# Patient Record
Sex: Female | Born: 1975 | Race: Black or African American | Hispanic: No | Marital: Married | Smoking: Never smoker
Health system: Southern US, Community
[De-identification: ages and names within clinical notes are randomized; demographics above are authoritative.]

## PROBLEM LIST (undated history)

## (undated) DIAGNOSIS — Z8371 Family history of colonic polyps: Secondary | ICD-10-CM

## (undated) DIAGNOSIS — T7840XA Allergy, unspecified, initial encounter: Secondary | ICD-10-CM

## (undated) DIAGNOSIS — Z9889 Other specified postprocedural states: Secondary | ICD-10-CM

## (undated) DIAGNOSIS — M1990 Unspecified osteoarthritis, unspecified site: Secondary | ICD-10-CM

## (undated) DIAGNOSIS — Z83719 Family history of colon polyps, unspecified: Secondary | ICD-10-CM

## (undated) DIAGNOSIS — K219 Gastro-esophageal reflux disease without esophagitis: Secondary | ICD-10-CM

## (undated) DIAGNOSIS — D649 Anemia, unspecified: Secondary | ICD-10-CM

## (undated) DIAGNOSIS — R112 Nausea with vomiting, unspecified: Secondary | ICD-10-CM

## (undated) HISTORY — DX: Family history of colon polyps, unspecified: Z83.719

## (undated) HISTORY — DX: Allergy, unspecified, initial encounter: T78.40XA

## (undated) HISTORY — DX: Family history of colonic polyps: Z83.71

## (undated) HISTORY — DX: Gastro-esophageal reflux disease without esophagitis: K21.9

## (undated) HISTORY — DX: Anemia, unspecified: D64.9

## (undated) HISTORY — PX: CARDIAC CATHETERIZATION: SHX172

---

## 2000-08-29 ENCOUNTER — Ambulatory Visit (HOSPITAL_COMMUNITY): Admission: RE | Admit: 2000-08-29 | Discharge: 2000-08-29 | Payer: Self-pay | Admitting: Family Medicine

## 2001-06-23 ENCOUNTER — Ambulatory Visit (HOSPITAL_COMMUNITY): Admission: RE | Admit: 2001-06-23 | Discharge: 2001-06-23 | Payer: Self-pay | Admitting: Family Medicine

## 2001-06-23 ENCOUNTER — Encounter: Payer: Self-pay | Admitting: Family Medicine

## 2002-08-06 ENCOUNTER — Encounter (HOSPITAL_COMMUNITY): Admission: RE | Admit: 2002-08-06 | Discharge: 2002-09-05 | Payer: Self-pay | Admitting: *Deleted

## 2002-08-07 ENCOUNTER — Encounter: Payer: Self-pay | Admitting: *Deleted

## 2003-06-15 ENCOUNTER — Ambulatory Visit (HOSPITAL_COMMUNITY): Admission: AD | Admit: 2003-06-15 | Discharge: 2003-06-15 | Payer: Self-pay | Admitting: Obstetrics and Gynecology

## 2003-06-18 ENCOUNTER — Ambulatory Visit (HOSPITAL_COMMUNITY): Admission: AD | Admit: 2003-06-18 | Discharge: 2003-06-18 | Payer: Self-pay | Admitting: Obstetrics and Gynecology

## 2003-06-25 ENCOUNTER — Ambulatory Visit (HOSPITAL_COMMUNITY): Admission: AD | Admit: 2003-06-25 | Discharge: 2003-06-25 | Payer: Self-pay | Admitting: Obstetrics and Gynecology

## 2003-06-28 ENCOUNTER — Inpatient Hospital Stay (HOSPITAL_COMMUNITY): Admission: RE | Admit: 2003-06-28 | Discharge: 2003-06-30 | Payer: Self-pay | Admitting: Obstetrics and Gynecology

## 2004-04-05 ENCOUNTER — Ambulatory Visit: Payer: Self-pay | Admitting: Family Medicine

## 2004-05-08 ENCOUNTER — Emergency Department (HOSPITAL_COMMUNITY): Admission: EM | Admit: 2004-05-08 | Discharge: 2004-05-08 | Payer: Self-pay | Admitting: Emergency Medicine

## 2004-05-23 ENCOUNTER — Ambulatory Visit: Payer: Self-pay | Admitting: Family Medicine

## 2004-07-06 ENCOUNTER — Ambulatory Visit: Payer: Self-pay | Admitting: Family Medicine

## 2004-10-12 ENCOUNTER — Ambulatory Visit: Payer: Self-pay | Admitting: Family Medicine

## 2005-01-30 ENCOUNTER — Ambulatory Visit: Payer: Self-pay | Admitting: Family Medicine

## 2005-01-30 ENCOUNTER — Ambulatory Visit (HOSPITAL_COMMUNITY): Admission: RE | Admit: 2005-01-30 | Discharge: 2005-01-30 | Payer: Self-pay | Admitting: Family Medicine

## 2005-05-02 ENCOUNTER — Ambulatory Visit: Payer: Self-pay | Admitting: Family Medicine

## 2005-08-01 ENCOUNTER — Ambulatory Visit: Payer: Self-pay | Admitting: Family Medicine

## 2005-09-13 ENCOUNTER — Ambulatory Visit: Payer: Self-pay | Admitting: Family Medicine

## 2005-11-22 ENCOUNTER — Ambulatory Visit: Payer: Self-pay | Admitting: Family Medicine

## 2005-12-20 ENCOUNTER — Ambulatory Visit: Payer: Self-pay | Admitting: Family Medicine

## 2005-12-21 ENCOUNTER — Ambulatory Visit: Payer: Self-pay | Admitting: Family Medicine

## 2005-12-25 ENCOUNTER — Ambulatory Visit: Payer: Self-pay | Admitting: Family Medicine

## 2006-01-16 ENCOUNTER — Ambulatory Visit: Payer: Self-pay | Admitting: Family Medicine

## 2006-02-22 ENCOUNTER — Ambulatory Visit: Payer: Self-pay | Admitting: Family Medicine

## 2006-06-14 ENCOUNTER — Ambulatory Visit (HOSPITAL_COMMUNITY): Admission: RE | Admit: 2006-06-14 | Discharge: 2006-06-14 | Payer: Self-pay | Admitting: Podiatry

## 2006-06-26 ENCOUNTER — Ambulatory Visit: Payer: Self-pay | Admitting: Family Medicine

## 2006-06-26 LAB — CONVERTED CEMR LAB
ALT: 11 units/L (ref 0–35)
AST: 8 units/L (ref 0–37)
Albumin: 3.9 g/dL (ref 3.5–5.2)
Alkaline Phosphatase: 72 units/L (ref 39–117)
BUN: 17 mg/dL (ref 6–23)
Bilirubin, Direct: 0.1 mg/dL (ref 0.0–0.3)
CO2: 23 meq/L (ref 19–32)
Calcium: 8.5 mg/dL (ref 8.4–10.5)
Chloride: 103 meq/L (ref 96–112)
Cholesterol: 167 mg/dL (ref 0–200)
Creatinine, Ser: 0.6 mg/dL (ref 0.40–1.20)
Glucose, Bld: 235 mg/dL — ABNORMAL HIGH (ref 70–99)
HDL: 50 mg/dL (ref >39)
Hgb A1c MFr Bld: 8.8 % — ABNORMAL HIGH (ref 4.6–6.1)
Indirect Bilirubin: 0.2 mg/dL (ref 0.0–0.9)
LDL Cholesterol: 106 mg/dL — ABNORMAL HIGH (ref 0–99)
Potassium: 4.5 meq/L (ref 3.5–5.3)
Sodium: 138 meq/L (ref 135–145)
Total Bilirubin: 0.3 mg/dL (ref 0.3–1.2)
Total CHOL/HDL Ratio: 3.3
Total Protein: 6.8 g/dL (ref 6.0–8.3)
Triglycerides: 57 mg/dL (ref <150)
VLDL: 11 mg/dL (ref 0–40)

## 2006-08-14 ENCOUNTER — Ambulatory Visit: Payer: Self-pay | Admitting: Family Medicine

## 2006-09-27 ENCOUNTER — Encounter: Payer: Self-pay | Admitting: Family Medicine

## 2006-09-30 ENCOUNTER — Ambulatory Visit: Payer: Self-pay | Admitting: Family Medicine

## 2006-12-30 ENCOUNTER — Encounter: Payer: Self-pay | Admitting: Family Medicine

## 2006-12-30 LAB — CONVERTED CEMR LAB: Hgb A1c MFr Bld: 6.6 % — ABNORMAL HIGH (ref 4.6–6.1)

## 2006-12-31 ENCOUNTER — Ambulatory Visit: Payer: Self-pay | Admitting: Family Medicine

## 2007-04-10 ENCOUNTER — Ambulatory Visit: Payer: Self-pay | Admitting: Family Medicine

## 2007-04-10 LAB — CONVERTED CEMR LAB: Retic Ct Pct: 0.7 % (ref 0.4–3.1)

## 2007-07-23 ENCOUNTER — Ambulatory Visit: Payer: Self-pay | Admitting: Family Medicine

## 2007-08-07 ENCOUNTER — Encounter: Payer: Self-pay | Admitting: Family Medicine

## 2007-08-07 LAB — CONVERTED CEMR LAB: Retic Ct Pct: 1 % (ref 0.4–3.1)

## 2007-09-03 DIAGNOSIS — J309 Allergic rhinitis, unspecified: Secondary | ICD-10-CM | POA: Insufficient documentation

## 2007-09-03 DIAGNOSIS — D509 Iron deficiency anemia, unspecified: Secondary | ICD-10-CM | POA: Insufficient documentation

## 2007-09-03 DIAGNOSIS — E785 Hyperlipidemia, unspecified: Secondary | ICD-10-CM

## 2007-09-03 DIAGNOSIS — E1169 Type 2 diabetes mellitus with other specified complication: Secondary | ICD-10-CM | POA: Insufficient documentation

## 2007-09-03 DIAGNOSIS — E669 Obesity, unspecified: Secondary | ICD-10-CM

## 2007-09-03 DIAGNOSIS — K219 Gastro-esophageal reflux disease without esophagitis: Secondary | ICD-10-CM

## 2007-09-03 HISTORY — DX: Iron deficiency anemia, unspecified: D50.9

## 2008-03-26 ENCOUNTER — Ambulatory Visit: Payer: Self-pay | Admitting: Family Medicine

## 2008-03-26 DIAGNOSIS — N771 Vaginitis, vulvitis and vulvovaginitis in diseases classified elsewhere: Secondary | ICD-10-CM

## 2008-03-26 LAB — CONVERTED CEMR LAB: Glucose, Bld: 120 mg/dL

## 2008-03-27 ENCOUNTER — Encounter: Payer: Self-pay | Admitting: Family Medicine

## 2008-03-27 LAB — CONVERTED CEMR LAB: Microalb, Ur: 4.13 mg/dL — ABNORMAL HIGH (ref 0.00–1.89)

## 2008-03-29 ENCOUNTER — Other Ambulatory Visit: Admission: RE | Admit: 2008-03-29 | Discharge: 2008-03-29 | Payer: Self-pay | Admitting: Obstetrics & Gynecology

## 2008-07-27 ENCOUNTER — Ambulatory Visit: Payer: Self-pay | Admitting: Family Medicine

## 2008-07-27 LAB — CONVERTED CEMR LAB
Blood Glucose, Fasting: 129 mg/dL
Hgb A1c MFr Bld: 5.9 %

## 2008-07-28 ENCOUNTER — Encounter: Payer: Self-pay | Admitting: Family Medicine

## 2008-07-28 LAB — CONVERTED CEMR LAB
ALT: 9 units/L (ref 0–35)
BUN: 13 mg/dL (ref 6–23)
Bilirubin, Direct: 0.1 mg/dL (ref 0.0–0.3)
CO2: 25 meq/L (ref 19–32)
Chloride: 107 meq/L (ref 96–112)
Cholesterol: 141 mg/dL (ref 0–200)
Eosinophils Relative: 2 % (ref 0–5)
Glucose, Bld: 107 mg/dL — ABNORMAL HIGH (ref 70–99)
HCT: 36.3 % (ref 36.0–46.0)
Hemoglobin: 11.1 g/dL — ABNORMAL LOW (ref 12.0–15.0)
LDL Cholesterol: 84 mg/dL (ref 0–99)
Lymphocytes Relative: 34 % (ref 12–46)
Monocytes Absolute: 0.4 10*3/uL (ref 0.1–1.0)
Monocytes Relative: 6 % (ref 3–12)
Neutro Abs: 3.4 10*3/uL (ref 1.7–7.7)
Potassium: 4.2 meq/L (ref 3.5–5.3)
RBC: 4.21 M/uL (ref 3.87–5.11)
Sodium: 140 meq/L (ref 135–145)
TSH: 3.254 microintl units/mL (ref 0.350–4.500)
Total Bilirubin: 0.3 mg/dL (ref 0.3–1.2)
Total CHOL/HDL Ratio: 3.1
VLDL: 11 mg/dL (ref 0–40)

## 2008-07-29 ENCOUNTER — Encounter: Payer: Self-pay | Admitting: Family Medicine

## 2008-07-29 LAB — CONVERTED CEMR LAB: Retic Ct Pct: 0.7 % (ref 0.4–3.1)

## 2008-10-05 ENCOUNTER — Ambulatory Visit: Payer: Self-pay | Admitting: Family Medicine

## 2008-10-05 DIAGNOSIS — D649 Anemia, unspecified: Secondary | ICD-10-CM

## 2008-10-05 LAB — CONVERTED CEMR LAB: Blood Glucose, Fasting: 114 mg/dL

## 2008-10-07 ENCOUNTER — Encounter: Payer: Self-pay | Admitting: Family Medicine

## 2008-10-19 ENCOUNTER — Encounter: Payer: Self-pay | Admitting: Family Medicine

## 2008-10-27 ENCOUNTER — Encounter: Payer: Self-pay | Admitting: Family Medicine

## 2008-10-27 LAB — CONVERTED CEMR LAB
Basophils Absolute: 0 10*3/uL (ref 0.0–0.1)
Basophils Relative: 0 % (ref 0–1)
Lymphocytes Relative: 33 % (ref 12–46)
MCHC: 31.7 g/dL (ref 30.0–36.0)
Monocytes Relative: 6 % (ref 3–12)
Neutro Abs: 4.8 10*3/uL (ref 1.7–7.7)
Neutrophils Relative %: 59 % (ref 43–77)
RBC: 4.13 M/uL (ref 3.87–5.11)
Vitamin B-12: 263 pg/mL (ref 211–911)

## 2008-11-10 ENCOUNTER — Ambulatory Visit (HOSPITAL_COMMUNITY): Payer: Self-pay | Admitting: Oncology

## 2008-11-10 ENCOUNTER — Encounter: Payer: Self-pay | Admitting: Family Medicine

## 2008-12-28 ENCOUNTER — Encounter (HOSPITAL_COMMUNITY): Admission: RE | Admit: 2008-12-28 | Discharge: 2009-01-19 | Payer: Self-pay | Admitting: Oncology

## 2008-12-29 ENCOUNTER — Ambulatory Visit (HOSPITAL_COMMUNITY): Payer: Self-pay | Admitting: Oncology

## 2008-12-31 ENCOUNTER — Encounter: Payer: Self-pay | Admitting: Family Medicine

## 2009-01-18 ENCOUNTER — Ambulatory Visit: Payer: Self-pay | Admitting: Family Medicine

## 2009-01-19 ENCOUNTER — Encounter: Payer: Self-pay | Admitting: Family Medicine

## 2009-01-19 LAB — CONVERTED CEMR LAB
AST: 10 units/L (ref 0–37)
Albumin: 4 g/dL (ref 3.5–5.2)
Alkaline Phosphatase: 61 units/L (ref 39–117)
Basophils Relative: 1 % (ref 0–1)
Calcium: 8.8 mg/dL (ref 8.4–10.5)
Creatinine, Ser: 0.64 mg/dL (ref 0.40–1.20)
Eosinophils Absolute: 0.1 10*3/uL (ref 0.0–0.7)
HDL: 47 mg/dL (ref >39)
MCHC: 30.7 g/dL (ref 30.0–36.0)
MCV: 86.4 fL (ref 78.0–100.0)
Neutrophils Relative %: 61 % (ref 43–77)
Platelets: 271 10*3/uL (ref 150–400)
Total Protein: 6.7 g/dL (ref 6.0–8.3)
Triglycerides: 46 mg/dL (ref <150)
WBC: 6.6 10*3/uL (ref 4.0–10.5)

## 2009-03-15 ENCOUNTER — Ambulatory Visit: Payer: Self-pay | Admitting: Orthopedic Surgery

## 2009-03-15 DIAGNOSIS — M722 Plantar fascial fibromatosis: Secondary | ICD-10-CM | POA: Insufficient documentation

## 2009-03-30 ENCOUNTER — Encounter: Payer: Self-pay | Admitting: Family Medicine

## 2009-04-21 ENCOUNTER — Ambulatory Visit: Payer: Self-pay | Admitting: Orthopedic Surgery

## 2009-04-26 ENCOUNTER — Other Ambulatory Visit: Admission: RE | Admit: 2009-04-26 | Discharge: 2009-04-26 | Payer: Self-pay | Admitting: Obstetrics & Gynecology

## 2009-05-19 ENCOUNTER — Ambulatory Visit: Payer: Self-pay | Admitting: Orthopedic Surgery

## 2009-06-30 ENCOUNTER — Ambulatory Visit (HOSPITAL_COMMUNITY): Payer: Self-pay | Admitting: Oncology

## 2009-06-30 ENCOUNTER — Encounter (HOSPITAL_COMMUNITY): Admission: RE | Admit: 2009-06-30 | Discharge: 2009-07-30 | Payer: Self-pay | Admitting: Oncology

## 2009-07-21 ENCOUNTER — Encounter: Payer: Self-pay | Admitting: Family Medicine

## 2009-11-28 ENCOUNTER — Ambulatory Visit: Payer: Self-pay | Admitting: Family Medicine

## 2009-11-28 DIAGNOSIS — R5381 Other malaise: Secondary | ICD-10-CM | POA: Insufficient documentation

## 2009-11-28 DIAGNOSIS — R5383 Other fatigue: Secondary | ICD-10-CM

## 2009-11-29 ENCOUNTER — Encounter: Payer: Self-pay | Admitting: Family Medicine

## 2009-11-30 ENCOUNTER — Telehealth: Payer: Self-pay | Admitting: Family Medicine

## 2009-12-02 LAB — CONVERTED CEMR LAB
AST: 9 units/L (ref 0–37)
Alkaline Phosphatase: 49 units/L (ref 39–117)
BUN: 16 mg/dL (ref 6–23)
Basophils Absolute: 0 10*3/uL (ref 0.0–0.1)
Bilirubin, Direct: 0.1 mg/dL (ref 0.0–0.3)
CO2: 20 meq/L (ref 19–32)
Calcium: 8.4 mg/dL (ref 8.4–10.5)
Creatinine, Ser: 0.56 mg/dL (ref 0.40–1.20)
Eosinophils Relative: 1 % (ref 0–5)
Glucose, Bld: 117 mg/dL — ABNORMAL HIGH (ref 70–99)
HCT: 34.5 % — ABNORMAL LOW (ref 36.0–46.0)
HDL: 41 mg/dL (ref >39)
Hemoglobin: 10.5 g/dL — ABNORMAL LOW (ref 12.0–15.0)
Indirect Bilirubin: 0.2 mg/dL (ref 0.0–0.9)
Lymphocytes Relative: 30 % (ref 12–46)
Monocytes Absolute: 0.5 10*3/uL (ref 0.1–1.0)
Monocytes Relative: 6 % (ref 3–12)
RDW: 14.7 % (ref 11.5–15.5)
Total Bilirubin: 0.3 mg/dL (ref 0.3–1.2)

## 2009-12-05 LAB — CONVERTED CEMR LAB: Microalb Creat Ratio: 3.7 mg/g (ref 0.0–30.0)

## 2010-03-10 ENCOUNTER — Encounter: Payer: Self-pay | Admitting: Family Medicine

## 2010-05-23 NOTE — Progress Notes (Signed)
Summary: [facility]  [facility]   Imported By: Lind Guest 07/21/2009 10:53:42  _____________________________________________________________________  External Attachment:    Type:   Image     Comment:   External Document

## 2010-05-23 NOTE — Letter (Signed)
Summary: 1st no show letter  1st no show letter   Imported By: Lind Guest 03/10/2010 08:34:16  _____________________________________________________________________  External Attachment:    Type:   Image     Comment:   External Document

## 2010-05-23 NOTE — Assessment & Plan Note (Signed)
Summary: 2 wk RE-CK FOOT/[facility]   Visit Type:  Follow-up  CC:  foot pain recheck.  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  She was treated with a Cam Walker and an injection as well as Motrin  His less pain less soreness no swelling she is improved she tried to get the boot off  Review of systems negative  Exam very little tenderness in the plantar fascia, range of motion is normal in the foot and ankle.  Muscle tone is normal.  Ankle is stable.  Skin is intact pulses excellent.  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 Patient Level III (13086)  Patient Instructions: 1)  Wear Tuli heel cup  2)  Continue stretching exercises  3)  Please schedule a follow-up appointment as needed.

## 2010-05-23 NOTE — Progress Notes (Signed)
  Phone Note Call from Patient   Caller: Patient Summary of Call: needs diflucan sent in - you put her on antibiotic kmart Initial call taken by: Adella Hare LPN,  November 30, 2009 4:27 PM  Follow-up for Phone Call        Rx Called In Follow-up by: Adella Hare LPN,  November 30, 2009 4:52 PM    New/Updated Medications: FLUCONAZOLE 150 MG TABS (FLUCONAZOLE) one tab by mouth Prescriptions: FLUCONAZOLE 150 MG TABS (FLUCONAZOLE) one tab by mouth  #3 x 0   Entered by:   Adella Hare LPN   Authorized by:   Syliva Overman MD   Signed by:   Adella Hare LPN on 16/01/9603   Method used:   Electronically to        [facility] [phone]* (retail)       [address]       [place], [place]  [address]       Ph: [phone] or [phone]       Fax: [phone]   RxID:   [id number]

## 2010-05-23 NOTE — Assessment & Plan Note (Signed)
Summary: FOLLOW UP/SLJ   Vital Signs:  Patient profile:   35 year old female Menstrual status:  regular Height:      61.5 inches Weight:      342.75 pounds BMI:     63.94 O2 Sat:      98 % Pulse rate:   95 / minute Pulse rhythm:   regular Resp:     16 per minute BP sitting:   110 / 70  (left arm) Cuff size:   xl  Vitals Entered By: Everitt Amber LPN (November 28, 2009 2:21 PM)  Nutrition Counseling: Patient's BMI is greater than 25 and therefore counseled on weight management options. CC: Follow up chronic problems   CC:  Follow up chronic problems.  History of Present Illness: Reports  that she has been  doing fairly well. She is under increased stress since her 35 y/o has recently been dx with DM She has notbeen testing her sugars, nor is she exercisisng and she has actuallly gained weight. Denies recent fever or chills. Denies sinus pressure, nasal congestion , ear pain or sore throat. Denies chest congestion, or cough productive of sputum. Denies chest pain, palpitations, PND, orthopnea or leg swelling. Denies abdominal pain, nausea, vomitting, diarrhea or constipation. Denies change in bowel movements or bloody stool. Denies dysuria , frequency, incontinence or hesitancy. Intermittent knee pain and swelling esp after standing for prolonged periods. Denies headaches, vertigo, seizures. Denies depression, anxiety or insomnia. Denies  rash, lesions, or itch.     Current Medications (verified): 1)  Byetta 10 Mcg Pen 10 Mcg/0.34ml  Soln (Exenatide) .... Inject Twice Daily 2)  Glucovance 5-500 Mg  Tabs (Glyburide-Metformin) .... Take Two Tablets By Mouth Twice Daily 3)  Lovastatin 10 Mg  Tabs (Lovastatin) .... Take One Tablet By Mouth On Mon. Wed. and Fri. At Bedtime 4)  Fexofenadine Hcl 180 Mg  Tabs (Fexofenadine Hcl) .... One Tab By Mouth Once Daily 5)  Nu-Iron 150 Mg Caps (Polysaccharide Iron Complex) .... Take 1 Tablet By Mouth Once A Day 6)  Januvia 100 Mg  Tabs  (Sitagliptin Phosphate) .... Take 1 Tablet By Mouth Once A Day 7)  Flonase 50 Mcg/act Susp (Fluticasone Propionate) .... 2 Puffs Per Nostril Daily 8)  Phentermine Hcl 37.5 Mg Tabs (Phentermine Hcl) .... Take 1 Tablet By Mouth Once A Day 9)  Nexium 40 Mg Cpdr (Esomeprazole Magnesium) .... One Cap By Mouth Once Daily  Allergies (verified): No Known Drug Allergies  Review of Systems      See HPI General:  Complains of fatigue. Eyes:  Complains of vision loss-both eyes; wears corrective lenses. MS:  Complains of joint pain and stiffness. Endo:  Denies excessive thirst and heat intolerance. Heme:  Denies abnormal bruising and bleeding. Allergy:  Complains of seasonal allergies; denies hives or rash and itching eyes.  Physical Exam  General:  . Morbidly obese female in no c/P distress. HEENT: No facial asymmetry,  EOMI, No sinus tenderness, TM's Clear, oropharynx  pink and moist.   Chest: Clear to auscultation bilaterally.  CVS: S1, S2, No murmurs, No S3.   Abd: Soft, Nontender.  MS: Adequate ROM spine, hips, shoulders and knees.  Ext: No edema.   CNS: CN 2-12 intact, power tone and sensation normal throughout.   Skin: Intact, no visible lesions or rashes.  Psych: Good eye contact, normal affect.  Memory intact, not anxious or depressed appearing.    Impression & Recommendations:  Problem # 1:  OBESITY, UNSPECIFIED (ICD-278.00) Assessment  Deteriorated  Ht: 61.5 (11/28/2009)   Wt: 342.75 (11/28/2009)   BMI: 63.94 (11/28/2009)  Problem # 2:  DM (ICD-250.00) Assessment: Comment Only  Her updated medication list for this problem includes:    Byetta 10 Mcg Pen 10 Mcg/0.48ml Soln (Exenatide) ..... Inject twice daily    Glucovance 5-500 Mg Tabs (Glyburide-metformin) .Marland Kitchen... Take two tablets by mouth twice daily    Januvia 100 Mg Tabs (Sitagliptin phosphate) .Marland Kitchen... Take 1 tablet by mouth once a day  Orders: T- Hemoglobin A1C ([id number]) T-Urine Microalbumin w/creat. ratio  [id number])  Labs Reviewed: Creat: 0.64 (01/18/2009)    Reviewed HgBA1c results: 6.3 (01/18/2009)  5.9 (07/27/2008)  Problem # 3:  GERD (ICD-530.81) Assessment: Unchanged  Her updated medication list for this problem includes:    Nexium 40 Mg Cpdr (Esomeprazole magnesium) ..... One cap by mouth once daily  Problem # 4:  HYPERLIPIDEMIA (ICD-272.4) Assessment: Comment Only  The following medications were removed from the medication list:    Lovastatin 10 Mg Tabs (Lovastatin) .Marland Kitchen... Take one tablet by mouth on mon. wed. and fri. at bedtime Her updated medication list for this problem includes:    Lovastatin 20 Mg Tabs (Lovastatin) .Marland Kitchen... Take 1 tab by mouth at bedtime  Orders: T-Hepatic Function [phone]) T-Lipid Profile [phone])  Labs Reviewed: SGOT: 10 (01/18/2009)   SGPT: 14 (01/18/2009)   HDL:47 (01/18/2009), 46 (07/27/2008)  LDL:94 (01/18/2009), 84 (07/27/2008)  Chol:150 (01/18/2009), 141 (07/27/2008)  Trig:46 (01/18/2009), 55 (07/27/2008)  Complete Medication List: 1)  Byetta 10 Mcg Pen 10 Mcg/0.78ml Soln (Exenatide) .... Inject twice daily 2)  Glucovance 5-500 Mg Tabs (Glyburide-metformin) .... Take two tablets by mouth twice daily 3)  Fexofenadine Hcl 180 Mg Tabs (Fexofenadine hcl) .... One tab by mouth once daily 4)  Nu-iron 150 Mg Caps (Polysaccharide iron complex) .... Take 1 tablet by mouth once a day 5)  Januvia 100 Mg Tabs (Sitagliptin phosphate) .... Take 1 tablet by mouth once a day 6)  Flonase 50 Mcg/act Susp (Fluticasone propionate) .... 2 puffs per nostril daily 7)  Phentermine Hcl 37.5 Mg Tabs (Phentermine hcl) .... Take 1 tablet by mouth once a day 8)  Nexium 40 Mg Cpdr (Esomeprazole magnesium) .... One cap by mouth once daily 9)  Penicillin V Potassium 500 Mg Tabs (Penicillin v potassium) .... Take 1 tablet by mouth three times a day 10)  Fluconazole 150 Mg Tabs (Fluconazole) .... One tab by mouth 11)  Lovastatin 20 Mg Tabs (Lovastatin)  .... Take 1 tab by mouth at bedtime  Other Orders: T-Basic Metabolic Panel [phone]) T-CBC w/Diff [id number]) T-TSH [id number]) T-Vitamin D (25-Hydroxy) [phone])  Patient Instructions: 1)  Please schedule a follow-up appointment in 3 months. 2)  It is important that you exercise regularly at least 30 minutes 5 times a week. If you develop chest pain, have severe difficulty breathing, or feel very tired , stop exercising immediately and seek medical attention. 3)  You need to lose weight. Consider a lower calorie diet and regular exercise.  4)  BMP prior to visit, ICD-9: 5)  Hepatic Panel prior to visit, ICD-9: 6)  Lipid Panel prior to visit, ICD-9: 7)  TSH prior to visit, ICD-9:   fasting labs asap 8)  CBC w/ Diff prior to visit, ICD-9: and anemia panel 9)  HbgA1C prior to visit, ICD-9: 10)  Vitamin d 11)  Meed is sent in for left ear infection. 12)  pls startphentermine half daily Prescriptions: LOVASTATIN 20 MG TABS (LOVASTATIN) Take 1 tab by  mouth at bedtime  #90 x 1   Entered and Authorized by:   Syliva Overman MD   Signed by:   Syliva Overman MD on 12/02/2009   Method used:   Historical   RxID:   [id number] PENICILLIN V POTASSIUM 500 MG TABS (PENICILLIN V POTASSIUM) Take 1 tablet by mouth three times a day  #21 x 0   Entered and Authorized by:   Syliva Overman MD   Signed by:   Syliva Overman MD on 11/28/2009   Method used:   Electronically to        [facility] [phone]* (retail)       [address]       [place], [place]  [address]       Ph: [phone] or [phone]       Fax: [phone]   RxID:   [id number]

## 2010-06-26 ENCOUNTER — Ambulatory Visit (INDEPENDENT_AMBULATORY_CARE_PROVIDER_SITE_OTHER): Payer: Self-pay | Admitting: Family Medicine

## 2010-06-26 ENCOUNTER — Other Ambulatory Visit: Payer: Self-pay | Admitting: Family Medicine

## 2010-06-26 ENCOUNTER — Encounter: Payer: Self-pay | Admitting: Family Medicine

## 2010-06-26 DIAGNOSIS — E119 Type 2 diabetes mellitus without complications: Secondary | ICD-10-CM

## 2010-06-26 DIAGNOSIS — E785 Hyperlipidemia, unspecified: Secondary | ICD-10-CM

## 2010-06-26 DIAGNOSIS — E669 Obesity, unspecified: Secondary | ICD-10-CM

## 2010-06-27 LAB — CONVERTED CEMR LAB
Albumin: 4.6 g/dL (ref 3.5–5.2)
Alkaline Phosphatase: 61 units/L (ref 39–117)
BUN: 13 mg/dL (ref 6–23)
CO2: 23 meq/L (ref 19–32)
Glucose, Bld: 105 mg/dL — ABNORMAL HIGH (ref 70–99)
Hgb A1c MFr Bld: 6.6 % — ABNORMAL HIGH (ref <5.7)
Total Bilirubin: 0.3 mg/dL (ref 0.3–1.2)

## 2010-06-27 LAB — COMPLETE METABOLIC PANEL WITH GFR
ALT: 9 U/L (ref 0–35)
Albumin: 4.6 g/dL (ref 3.5–5.2)
CO2: 23 mEq/L (ref 19–32)
Chloride: 104 mEq/L (ref 96–112)
GFR, Est African American: >60 mL/min (ref >60)
GFR, Est Non African American: >60 mL/min (ref >60)
Glucose, Bld: 105 mg/dL — ABNORMAL HIGH (ref 70–99)
Potassium: 4.5 mEq/L (ref 3.5–5.3)
Sodium: 139 mEq/L (ref 135–145)
Total Protein: 7.3 g/dL (ref 6.0–8.3)

## 2010-07-11 ENCOUNTER — Other Ambulatory Visit: Payer: Self-pay | Admitting: Family Medicine

## 2010-07-11 NOTE — Assessment & Plan Note (Signed)
Summary: office visit   Vital Signs:  Patient profile:   35 year old female Menstrual status:  regular Height:      61.5 inches Weight:      343.75 pounds BMI:     64.13 O2 Sat:      99 % Pulse rate:   65 / minute Pulse rhythm:   regular Resp:     16 per minute BP sitting:   110 / 72  (left arm) Cuff size:   xl  Vitals Entered By: Everitt Amber LPN (June 25, 6960 2:22 PM)  Nutrition Counseling: Patient's BMI is greater than 25 and therefore counseled on weight management options. CC: Follow up chronic problems   CC:  Follow up chronic problems.  History of Present Illness: Reports  thatshe has been doing well. she has been unable tocommit to regular exercise, and has not changed her diet much, but is till workig on these Denies recent fever or chills. Denies sinus pressure, nasal congestion , ear pain or sore throat. Denies chest congestion, or cough productive of sputum. Denies chest pain, palpitations, PND, orthopnea or leg swelling. Denies abdominal pain, nausea, vomitting, diarrhea or constipation. Denies change in bowel movements or bloody stool. Denies dysuria , frequency, incontinence or hesitancy. Denies  joint pain, swelling, or reduced mobility. Denies headaches, vertigo, seizures. Denies depression, anxiety or insomnia. Denies  rash, lesions, or itch.     Current Medications (verified): 1)  Byetta 10 Mcg Pen 10 Mcg/0.16ml  Soln (Exenatide) .... Inject Twice Daily 2)  Glucovance 5-500 Mg  Tabs (Glyburide-Metformin) .... Take Two Tablets By Mouth Twice Daily 3)  Fexofenadine Hcl 180 Mg  Tabs (Fexofenadine Hcl) .... One Tab By Mouth Once Daily 4)  Nu-Iron 150 Mg Caps (Polysaccharide Iron Complex) .... Take 1 Tablet By Mouth Once A Day 5)  Januvia 100 Mg  Tabs (Sitagliptin Phosphate) .... Take 1 Tablet By Mouth Once A Day 6)  Flonase 50 Mcg/act Susp (Fluticasone Propionate) .... 2 Puffs Per Nostril Daily 7)  Phentermine Hcl 37.5 Mg Tabs (Phentermine Hcl)  .... Take 1 Tablet By Mouth Once A Day 8)  Nexium 40 Mg Cpdr (Esomeprazole Magnesium) .... One Cap By Mouth Once Daily 9)  Lovastatin 20 Mg Tabs (Lovastatin) .... Take 1 Tab By Mouth At Bedtime  Allergies (verified): No Known Drug Allergies  Review of Systems      See HPI General:  Complains of fatigue. Eyes:  Denies discharge and red eye. Endo:  Denies cold intolerance, excessive hunger, excessive thirst, excessive urination, and heat intolerance. Heme:  Denies abnormal bruising, bleeding, enlarge lymph nodes, fevers, and pallor. Allergy:  Complains of seasonal allergies.  Physical Exam  General:  . Morbidly obese female in no c/P distress. HEENT: No facial asymmetry,  EOMI, No sinus tenderness, TM's Clear, oropharynx  pink and moist.   Chest: Clear to auscultation bilaterally.  CVS: S1, S2, No murmurs, No S3.   Abd: Soft, Nontender.  MS: Adequate ROM spine, hips, shoulders and knees.  Ext: No edema.   CNS: CN 2-12 intact, power tone and sensation normal throughout.   Skin: Intact, no visible lesions or rashes.  Psych: Good eye contact, normal affect.  Memory intact, not anxious or depressed appearing.    Impression & Recommendations:  Problem # 1:  HYPERLIPIDEMIA (ICD-272.4) Assessment Comment Only  Her updated medication list for this problem includes:    Lovastatin 20 Mg Tabs (Lovastatin) .Marland Kitchen... Take 1 tab by mouth at bedtime Low fat diet  discussed and encouraged, and literature also given  Orders: T-Lipid Profile [id number]) T-Hepatic Function [id number])  Labs Reviewed: SGOT: 9 (12/01/2009)   SGPT: 10 (12/01/2009)   HDL:41 (12/01/2009), 47 (01/18/2009)  LDL:93 (12/01/2009), 94 (01/18/2009)  Chol:144 (12/01/2009), 150 (01/18/2009)  Trig:48 (12/01/2009), 46 (01/18/2009)  Problem # 2:  DM (ICD-250.00) Assessment: Unchanged  The following medications were removed from the medication list:    Januvia 100 Mg Tabs (Sitagliptin phosphate) .Marland Kitchen... Take 1 tablet by  mouth once a day    Byetta 10 Mcg Pen 10 Mcg/0.38ml Soln (Exenatide) ..... Inject twice daily Her updated medication list for this problem includes:    Glucovance 5-500 Mg Tabs (Glyburide-metformin) .Marland Kitchen... Take two tablets by mouth twice daily bydureon once weekly Orders: T-CMP with estimated GFR ([id number]) T- Hemoglobin A1C ([id number]) T-Basic Metabolic Panel [id number]) T- Hemoglobin A1C ([id number])  Labs Reviewed: Creat: 0.56 (12/01/2009)    Reviewed HgBA1c results: 7.0 (12/01/2009)  6.3 (01/18/2009)  Problem # 3:  GERD (ICD-530.81) Assessment: Improved  Her updated medication list for this problem includes:    Nexium 40 Mg Cpdr (Esomeprazole magnesium) ..... One cap by mouth once daily  Complete Medication List: 1)  Glucovance 5-500 Mg Tabs (Glyburide-metformin) .... Take two tablets by mouth twice daily 2)  Nu-iron 150 Mg Caps (Polysaccharide iron complex) .... Take 1 tablet by mouth once a day 3)  Flonase 50 Mcg/act Susp (Fluticasone propionate) .... 2 puffs per nostril daily 4)  Nexium 40 Mg Cpdr (Esomeprazole magnesium) .... One cap by mouth once daily 5)  Lovastatin 20 Mg Tabs (Lovastatin) .... Take 1 tab by mouth at bedtime 6)  Fluticasone Propionate 50 Mcg/act Susp (Fluticasone propionate) .... One to two puffs per nostril once daily as needed for uncontrolled allergies 7)  Nu-iron 150 Mg Caps (Polysaccharide iron complex) .... Take 1 capsule by mouth once a day 8)  Bydureon  .... Inject 2mg  subcutaneouslyat any time of the day, with or without meals once weekly, discontinue byetta  Patient Instructions: 1)  Please schedule a follow-up appointment in 4 months. 2)  cMP  and EGFRprior to visit, ICD-9:  today 3)  HbgA1C prior to visit, ICD-9: 4)  BMP prior to visit, ICD-9: 5)  Hepatic Panel prior to visit, ICD-9: 6)  Lipid Panel prior to visit, ICD-9:  fasting in 4 months 7)  HbgA1C prior to visit, ICD-9: 8)  Check your blood sugars regularly. If your  readings are usually above 250 or below 70 you should contact our office. 9)  Stop the januvia pls this is equivalent to byetta, and you will be satrted on once weekly bydureon, ok to call assist # for help with med administration, also pls call back if copay is too high Prescriptions: BYDUREON inject 2mg  subcutaneouslyat any time of the day, with or without meals once weekly, discontinue byetta  #12 x 3   Entered and Authorized by:   Syliva Overman MD   Signed by:   Syliva Overman MD on 06/26/2010   Method used:   Printed then faxed to ...       [facility]* (retail)       [address]       [address] Shipping/mailing       [place], [place]  [address]       Ph: [phone]       Fax: [phone]   RxID:   [id number] NU-IRON 150 MG CAPS (POLYSACCHARIDE IRON COMPLEX) Take 1 capsule by mouth once  a day  #90 x 3   Entered and Authorized by:   Syliva Overman MD   Signed by:   Syliva Overman MD on 06/26/2010   Method used:   Electronically to        [facility]* (retail)       [address]       [address] Shipping/mailing       [place], [place]  [address]       Ph: [phone]       Fax: [phone]   RxID:   [id number] FLUTICASONE PROPIONATE 50 MCG/ACT SUSP (FLUTICASONE PROPIONATE) one to two puffs per nostril once daily as needed for uncontrolled allergies  #3 x 3   Entered and Authorized by:   Syliva Overman MD   Signed by:   Syliva Overman MD on 06/26/2010   Method used:   Electronically to        [facility]* (retail)       [address]       [address] Shipping/mailing       [place], [place]  [address]       Ph: [phone]       Fax: [phone]   RxID:   [id number]    Orders Added: 1)  Est. Patient Level IV [18299] 2)  T-CMP with estimated GFR [[address]] 3)  T- Hemoglobin A1C [83036-23375] 4)  T-Basic Metabolic Panel [[id number]] 5)  T-Lipid Profile [80061-22930] 6)   T-Hepatic Function [80076-22960] 7)  T- Hemoglobin A1C [83036-23375]

## 2010-07-16 LAB — CBC
HCT: 33.8 % — ABNORMAL LOW (ref 36.0–46.0)
Hemoglobin: 11.5 g/dL — ABNORMAL LOW (ref 12.0–15.0)
MCV: 82.6 fL (ref 78.0–100.0)
Platelets: 262 10*3/uL (ref 150–400)
RBC: 4.1 MIL/uL (ref 3.87–5.11)
WBC: 7.5 10*3/uL (ref 4.0–10.5)

## 2010-07-16 LAB — VITAMIN B12: Vitamin B-12: 267 pg/mL (ref 211–911)

## 2010-07-18 ENCOUNTER — Other Ambulatory Visit: Payer: Self-pay | Admitting: Family Medicine

## 2010-07-28 LAB — CBC
HCT: 33.7 % — ABNORMAL LOW (ref 36.0–46.0)
Hemoglobin: 11.3 g/dL — ABNORMAL LOW (ref 12.0–15.0)
MCHC: 33.6 g/dL (ref 30.0–36.0)
MCV: 84.9 fL (ref 78.0–100.0)
RBC: 3.97 MIL/uL (ref 3.87–5.11)
WBC: 7.7 10*3/uL (ref 4.0–10.5)

## 2010-07-28 LAB — COPPER, SERUM: Copper: 107 ug/dL (ref 70–175)

## 2010-09-08 NOTE — Discharge Summary (Signed)
[name], [name]                         ACCOUNT NO.:  [id number]   MEDICAL RECORD NO.:  [id number]                   PATIENT TYPE:  OIB   LOCATION:  A415                                 FACILITY:  [facility]   PHYSICIAN:  Tilda Burrow, M.D.              DATE OF BIRTH:  1975/07/18   DATE OF ADMISSION:  06/25/2003  DATE OF DISCHARGE:  06/25/2003                                 DISCHARGE SUMMARY   ADMITTING DIAGNOSES:  1. Pregnancy 39 weeks.  2. Prior cesarean section, not for trial of labor.  3. Class A-2 diabetes mellitus, insulin treated.   DISCHARGE DIAGNOSES:  1. Pregnancy 39 weeks.  2. Prior cesarean section, not for trial of labor.  3. Insulin-dependent diabetes.  4. Delivered.   PROCEDURE:  Repeat low-transverse cervical cesarean section June 28, 2003.   DISCHARGE MEDICATIONS:  1. Tylox one p.o. q.4h. p.r.n. pain, dispense 15.  2. Motrin p.r.n. minor discomfort.   FOLLOWUP:  In one week, then four weeks.   HOSPITAL SUMMARY:  This 35 year old female was admitted for repeat cesarean  section.  This was performed on June 28, 2003.  The infant was cared for by  Dr. Allena Katz.  Postoperatively, the patient did well and was breast  feeding.  Hemoglobin was 10.5, hematocrit 31.9.  Blood sugar was 108.  The  patient had a Jackson-Pratt drain in the subcu spaces which were normal.  She tolerated a blood sugar as high as 160 postprandial.  She was started  back on metformin.  Prior to pregnancy, she was on Glucovance 2.5/500 prior  to the pregnancy.   We had quite a discussion about the need for Ms. Suto and her husband to  both seriously consider weight loss.  Her obesity is considered a risk to  her health at this point, but particularly in light of her diabetes.  We  emphasized the need for weight loss.   FOLLOWUP:  She is discharged home for follow up in one weeks, and four weeks  in our office.     ___________________________________________                            Tilda Burrow, M.D.   JVF/MEDQ  D:  07/18/2003  T:  07/19/2003  Job:  [id number]

## 2010-09-08 NOTE — Op Note (Signed)
[name], [name]                         ACCOUNT NO.:  [id number]   MEDICAL RECORD NO.:  [id number]                   PATIENT TYPE:  INP   LOCATION:  A410                                 FACILITY:  [facility]   PHYSICIAN:  Tilda Burrow, M.D.              DATE OF BIRTH:  13-Jan-1976   DATE OF PROCEDURE:  DATE OF DISCHARGE:                                 OPERATIVE REPORT   PREOPERATIVE DIAGNOSES:  1. Pregnancy at 39 weeks.  2. Gestational diabetes, type A2, insulin-dependent during pregnancy.  3. Repeat cesarean section.   POSTOPERATIVE DIAGNOSES:  1. Pregnancy at 39 weeks.  2. Gestational diabetes, type A2, insulin-dependent during pregnancy.  3. Repeat cesarean section.   PROCEDURE:  Repeat low transverse cervical cesarean section.   SURGEON:  Tilda Burrow, M.D.   ASSISTANTDolphus Jenny, R.N., Delman Cheadle, C.S.T., F.A.   ANESTHESIA:  Spinal.  Gonzales.   COMPLICATIONS:  None.   ESTIMATED BLOOD LOSS:  500 cc.   FINDINGS:  Large female infant, 8 pounds 15 ounces.  Apgars 9 and 9.   DRAINS:  Jackson-Pratt drain.   INDICATIONS FOR PROCEDURE:  A 35 year old female followed for this  pregnancy, on oral diabetic agents prior to pregnancy, managed by Dr.  Lodema Hong who converted to insulin during the pregnancy and was on 26 units of  NPH and 16 units of regular in the morning, 24 units of NPH and 16 units of  regular in the p.m. during the pregnancy with excellent blood sugar control.   DESCRIPTION OF PROCEDURE:  The patient was taken to the operating room and  prepped and draped for vertical lower abdominal surgery after spinal  anesthesia introduced and Foley catheter inserted.   The old cicatrix was excised, the abdomen entered in the midline.  The  peritoneal cavity showed some adhesions from the anterior peritoneum and  bladder to the lower uterine segment which were cut free and then the  bladder flap developed, transverse uterine incision made in the lower  uterine segment with extensive laterally using index finger traction.  The  fetal vertex was guided into the incision, vacuum extractor used to assist  with guidance while fundal pressure applied, and then the infant delivered  easily with minimal pressure applied.  The cord was clamped.  The infant was  taken to Dr. Milford Cage for his subsequent care.  See his notes for details.   Infant weight was 8 pounds 15 ounces.  The amniotic fluid was clear without  malodor.  The placenta was quite large, clear, well-attached on the  posterior fundal portion of the uterus and expelled intact.  The placenta  was discarded.   Cord blood samples had been obtained.  We then proceeded with single layer  running locking closure of the uterine incision.  Due to oozing in the  midline, a second layer of imbricating continuous running 0 chromic was  used.  The bladder  flap was reapproximated using three sutures of  interrupted 2-0 plain followed by interrupted 2-0 chromic followed by  irrigation of the abdomen, closure of the anterior peritoneum, and then  closure of the fascia with continuous running 0 Vicryl, and then  subcutaneous JP drain placed and allowed to exit through the left lower  quadrant just lateral to the incision, and then 2-0 plain closure of the  subcutaneous fatty tissues and staple closure of the skin.   The patient tolerated the procedure well and went to the recovery room in  good condition.      ___________________________________________                                            Tilda Burrow, M.D.   JVF/MEDQ  D:  06/28/2003  T:  06/28/2003  Job:  [id number]   cc:   [name], D.O.  [address], [address]  [place]  [place] [address]  Fax: [phone]   Milus Mallick. Lodema Hong, M.D.  [address]  [place], [place] [address]  Fax: [phone]

## 2010-09-08 NOTE — H&P (Signed)
[name], [name]                         ACCOUNT NO.:  [id number]   MEDICAL RECORD NO.:  [id number]                   PATIENT TYPE:  OIB   LOCATION:  A414                                 FACILITY:  [facility]   PHYSICIAN:  Tilda Burrow, M.D.              DATE OF BIRTH:  1975-12-18   DATE OF ADMISSION:  06/18/2003  DATE OF DISCHARGE:  06/18/2003                                HISTORY & PHYSICAL   ADMISSION DIAGNOSES:  Pregnancy 39 weeks, prior cesarean section after trial  of labor, class A2 diabetes mellitus, insulin treated.   HISTORY OF PRESENT ILLNESS:  This 35 year old female gravida 3, para 1, AB 1  due July 05, 2003 by first trimester ultrasound with uncertain LMP is  admitted at 39 weeks by that early criteria for repeat cesarean section.  [name] has been followed through our office with excellent blood sugar  control on insulin 26 units N and 16 units regular in the morning, 24 units  N and 16 units regular in the evening with blood sugars under excellent  control.  Ultrasound is showing a normal fluid volume with the patient  planning to have vasectomy as future contraception.   PRENATAL LABS:  Blood type A positive, urine drug screen negative, rubella  immunity present.  Hemoglobin 11, hematocrit 35, hepatitis, HIV, GC,  chlamydia, RPR are all negative.  MSAFP normal at 1 and 600. Blood sugars,  hemoglobin A1C was 6.5 early in the pregnancy.   PHYSICAL EXAMINATION:  GENERAL:  Reveals a morbidly obese African-American  female. Overweight large framed, stocky, African-American female.  VITAL SIGNS:  Height 5 feet 2 1/2 inches, weight 379 which is a 43 pound  weight gain. Blood pressure 112/70.  HEENT:  Pupils equal round and reactive.  ABDOMEN:  40 cm fundal height, 18 cm above the umbilicus.  Gravid uterus  present.  Fetal heart tones are in the left lower quadrant.   PLAN:  Repeat cesarean section on June 28, 2003.      ___________________________________________                                         Tilda Burrow, M.D.   JVF/MEDQ  D:  06/22/2003  T:  06/22/2003  Job:  [id number]   cc:   [name], D.O.  [address], [address]  [place]  [place] [address]  Fax: [phone]

## 2010-09-08 NOTE — Procedures (Signed)
   NAME:  [name], UNCAPHER NO.:  [id number]   MEDICAL RECORD NO.:  [id number]                   PATIENT TYPE:   LOCATION:                                       FACILITY:   PHYSICIAN:  Kem Boroughs, M.D.                 DATE OF BIRTH:   DATE OF PROCEDURE:  08/06/2002  DATE OF DISCHARGE:                                    STRESS TEST   REFERRING PHYSICIAN:  Madaline Savage, M.D.   PRIMARY CARE PHYSICIAN:  Milus Mallick. Lodema Hong, M.D.   PROCEDURE:  Nuclear stress test.   INDICATIONS:  The patient is a 36 year old female who had an abnormal  electrocardiogram prompting a baseline stress test.   DESCRIPTION OF PROCEDURE:  Adenosine was infused per protocol.  Baseline  blood pressure was 130/70 with a heart rate of about 70.  Baseline 12-lead  electrocardiogram reveals normal sinus rhythm with sinus arrhythmia and  otherwise nonspecific changes.  She had tolerated the adenosine without  difficulty.  Peak heart rate was 119 beats per minute with a procedural  blood pressure of 118/80.  The EKG showed no further changes.  She reverted  to her baseline within 1-2 minutes after discontinuation of the adenosine.  There were no immediate complications.   IMPRESSION:  1. Clinically negative for angina.  2. Electrocardiogram negative for ischemia.  3. Scintigraphic images are pending.                                               Kem Boroughs, M.D.    TB/MEDQ  D:  08/06/2002  T:  08/06/2002  Job:  [id number]   cc:   Madaline Savage, M.D.  [address], [address]  [place]  [place] [address]  Fax: [phone]   Milus Mallick. Lodema Hong, M.D.  [address]  [place], [place] [address]  Fax: [phone]

## 2010-10-18 ENCOUNTER — Other Ambulatory Visit: Payer: Self-pay | Admitting: Family Medicine

## 2010-10-26 ENCOUNTER — Encounter: Payer: Self-pay | Admitting: Family Medicine

## 2010-11-06 ENCOUNTER — Ambulatory Visit (INDEPENDENT_AMBULATORY_CARE_PROVIDER_SITE_OTHER): Payer: Self-pay | Admitting: Family Medicine

## 2010-11-06 ENCOUNTER — Encounter: Payer: Self-pay | Admitting: Family Medicine

## 2010-11-06 ENCOUNTER — Other Ambulatory Visit: Payer: Self-pay | Admitting: Family Medicine

## 2010-11-06 VITALS — BP 110/70 | HR 82 | Resp 16 | Ht 62.0 in | Wt 346.1 lb

## 2010-11-06 DIAGNOSIS — D509 Iron deficiency anemia, unspecified: Secondary | ICD-10-CM

## 2010-11-06 DIAGNOSIS — R5383 Other fatigue: Secondary | ICD-10-CM

## 2010-11-06 DIAGNOSIS — E119 Type 2 diabetes mellitus without complications: Secondary | ICD-10-CM

## 2010-11-06 DIAGNOSIS — E785 Hyperlipidemia, unspecified: Secondary | ICD-10-CM

## 2010-11-06 DIAGNOSIS — Z13820 Encounter for screening for osteoporosis: Secondary | ICD-10-CM

## 2010-11-06 DIAGNOSIS — J301 Allergic rhinitis due to pollen: Secondary | ICD-10-CM

## 2010-11-06 DIAGNOSIS — K219 Gastro-esophageal reflux disease without esophagitis: Secondary | ICD-10-CM

## 2010-11-06 DIAGNOSIS — E669 Obesity, unspecified: Secondary | ICD-10-CM

## 2010-11-06 DIAGNOSIS — M79673 Pain in unspecified foot: Secondary | ICD-10-CM | POA: Insufficient documentation

## 2010-11-06 DIAGNOSIS — M79609 Pain in unspecified limb: Secondary | ICD-10-CM

## 2010-11-06 NOTE — Patient Instructions (Addendum)
F/U in 4 months.  A healthy diet is rich in fruit, vegetables and whole grains. Poultry fish, nuts and beans are a healthy choice for protein rather then red meat. A low sodium diet and drinking 64 ounces of water daily is generally recommended. Oils and sweet should be limited. Carbohydrates especially for those who are diabetic or overweight, should be limited to 34-45 gram per meal. It is important to eat on a regular schedule, at least 3 times daily. Snacks should be primarily fruits, vegetables or nuts.   It is important that you exercise regularly at least 30 minutes 5 times a week. If you develop chest pain, have severe difficulty breathing, or feel very tired, stop exercising immediately and seek medical attention    Labs today, fasting  You will be referred to physical therapy for foot pain due to heel spurs, also to the nutritionist for help with weight and diabetes, pls start a food diary, and measure your carbs, also eating on a schedule is crucial

## 2010-11-07 LAB — BASIC METABOLIC PANEL
BUN: 12 mg/dL (ref 6–23)
CO2: 24 mEq/L (ref 19–32)
Glucose, Bld: 134 mg/dL — ABNORMAL HIGH (ref 70–99)
Potassium: 4.3 mEq/L (ref 3.5–5.3)
Sodium: 137 mEq/L (ref 135–145)

## 2010-11-07 LAB — ANEMIA PANEL
%SAT: 17 % — ABNORMAL LOW (ref 20–55)
ABS Retic: 45.3 10*3/uL (ref 19.0–186.0)
Iron: 46 ug/dL (ref 42–145)
TIBC: 267 ug/dL (ref 250–470)
UIBC: 221 ug/dL

## 2010-11-07 LAB — HEMOGLOBIN A1C
Hgb A1c MFr Bld: 6.6 % — ABNORMAL HIGH (ref <5.7)
Mean Plasma Glucose: 143 mg/dL — ABNORMAL HIGH (ref <117)

## 2010-11-07 LAB — CBC WITH DIFFERENTIAL/PLATELET
Eosinophils Absolute: 0.1 10*3/uL (ref 0.0–0.7)
HCT: 34.4 % — ABNORMAL LOW (ref 36.0–46.0)
Hemoglobin: 10.8 g/dL — ABNORMAL LOW (ref 12.0–15.0)
Lymphs Abs: 2.1 10*3/uL (ref 0.7–4.0)
MCH: 26.3 pg (ref 26.0–34.0)
MCHC: 31.4 g/dL (ref 30.0–36.0)
Monocytes Absolute: 0.4 10*3/uL (ref 0.1–1.0)
Monocytes Relative: 6 % (ref 3–12)
Neutro Abs: 4.2 10*3/uL (ref 1.7–7.7)
Neutrophils Relative %: 62 % (ref 43–77)
RBC: 4.1 MIL/uL (ref 3.87–5.11)

## 2010-11-07 LAB — TSH: TSH: 2.304 u[IU]/mL (ref 0.350–4.500)

## 2010-11-07 LAB — LIPID PANEL
Total CHOL/HDL Ratio: 3 Ratio
VLDL: 10 mg/dL (ref 0–40)

## 2010-11-11 NOTE — Assessment & Plan Note (Signed)
Deteriorated. Patient re-educated about  the importance of commitment to a  minimum of 150 minutes of exercise per week. The importance of healthy food choices with portion control discussed. Encouraged to start a food diary, count calories and to consider  joining a support group. Sample diet sheets offered. Goals set by the patient for the next several months.    

## 2010-11-11 NOTE — Assessment & Plan Note (Signed)
Controlled, no change in medication Of note pt also on once weekly bydureon

## 2010-11-11 NOTE — Assessment & Plan Note (Addendum)
Deteriorated, pt to see physical therapy

## 2010-11-11 NOTE — Assessment & Plan Note (Signed)
Controlled with current med on as needed basis, continue same

## 2010-11-11 NOTE — Assessment & Plan Note (Signed)
Controlled, no change in medication  

## 2010-11-11 NOTE — Progress Notes (Signed)
  Subjective:    Patient ID: [name], female    DOB: 12-20-1975, 35 y.o.   MRN: [id number]  HPI The PT is here for follow up and re-evaluation of chronic medical conditions, medication management and review of recent lab and radiology data.  Preventive health is updated, specifically  Cancer screening,  and Immunization.   Questions or concerns regarding consultations or procedures which the PT has had in the interim are  addressed. The PT denies any adverse reactions to current medications since the last visit.  There are no new concerns.She is however still struggling with weight , and is not at all idligent with the necessary lifestyle changes to facilitate any weight loss  There are no specific complaints  Tests blood suagrs at least 4 times weekly, and fasting sugars are seldom over 120      Review of Systems Denies recent fever or chills. Denies sinus pressure, nasal congestion, ear pain or sore throat. Denies chest congestion, productive cough or wheezing. Denies chest pains, palpitations, paroxysmal nocturnal dyspnea, orthopnea and leg swelling Denies abdominal pain, nausea, vomiting,diarrhea or constipation.  Denies rectal bleeding or change in bowel movement. Denies dysuria, frequency, hesitancy or incontinence. Denies joint pain, swelling and limitation in mobility. Denies headaches, seizure, numbness, or tingling. Denies depression, anxiety or insomnia. Denies skin break down or rash.        Objective:   Physical Exam Patient alert and oriented and in no Cardiopulmonary distress.  HEENT: No facial asymmetry, EOMI, no sinus tenderness, TM's clear, Oropharynx pink and moist.  Neck supple no adenopathy.  Chest: Clear to auscultation bilaterally.  CVS: S1, S2 no murmurs, no S3.  ABD: Soft non tender. Bowel sounds normal.  Ext: No edema  MS: Adequate ROM spine, shoulders, hips and knees.  Skin: Intact, no ulcerations or rash noted.  Psych: Good eye  contact, normal affect. Memory intact not anxious or depressed appearing.  CNS: CN 2-12 intact, power, tone and sensation normal throughout.        Assessment & Plan:

## 2010-11-27 ENCOUNTER — Ambulatory Visit (HOSPITAL_COMMUNITY)
Admission: RE | Admit: 2010-11-27 | Discharge: 2010-11-27 | Disposition: A | Discharge status: Home / Self Care | Payer: 59 | Source: Ambulatory Visit | Attending: Family Medicine | Admitting: Family Medicine

## 2010-11-27 DIAGNOSIS — M25579 Pain in unspecified ankle and joints of unspecified foot: Secondary | ICD-10-CM | POA: Insufficient documentation

## 2010-11-27 DIAGNOSIS — M25676 Stiffness of unspecified foot, not elsewhere classified: Secondary | ICD-10-CM | POA: Insufficient documentation

## 2010-11-27 DIAGNOSIS — E119 Type 2 diabetes mellitus without complications: Secondary | ICD-10-CM | POA: Insufficient documentation

## 2010-11-27 DIAGNOSIS — R262 Difficulty in walking, not elsewhere classified: Secondary | ICD-10-CM | POA: Insufficient documentation

## 2010-11-27 DIAGNOSIS — M25673 Stiffness of unspecified ankle, not elsewhere classified: Secondary | ICD-10-CM | POA: Insufficient documentation

## 2010-11-27 DIAGNOSIS — IMO0001 Reserved for inherently not codable concepts without codable children: Secondary | ICD-10-CM | POA: Insufficient documentation

## 2010-11-27 NOTE — Patient Instructions (Addendum)
HEP for plantar fascia stretch.

## 2010-11-27 NOTE — Progress Notes (Signed)
Physical Therapy Evaluation  Patient Name: [name] Date: 11/27/2010 HPI:  B plantar fascitits Symptoms/Limitations Symptoms: Pt states that she has had heel pain for over two years now. She has had injections in her heel which helped for about three months.  The patint states that she almost fell but caught herself and was diagnosed with tearing her plantar fascia on the right foot last year.  The patient states she was put in a boot for 5 wks and then injected.  She is still having pain so she has been referred to therapy. How long can you stand comfortably?: sometimes it begins to burn. How long can you walk comfortably?: wallking for eight hour. Pain Assessment Currently in Pain?: Yes Pain Score:   9 Pain Location: Other (Comment) (at it's worst 0 at it's best.) Pain Orientation: Right Pain Onset: More than a month ago Pain Frequency: Intermittent Multiple Pain Sites: Yes Past Medical History:  Past Medical History  Diagnosis Date  . Diabetes mellitus   . Allergy   . GERD (gastroesophageal reflux disease)   . Anemia    Past Surgical History:  Past Surgical History  Procedure Date  . Cesarean section     x 2    Precautions/Restrictions   none  Prior Functioning   The patient could work her whole shift without pain.  Cognition  Alert and oriented  Sensation/Coordination/Flexibility    Assessment RLE AROM (degrees) Overall AROM Right Lower Extremity: Within functional limits for tasks assessed RLE Strength RLE Overall Strength: Within Functional Limits for tasks assessed LLE Assessment LLE Assessment: Within Functional Limits LLE AROM (degrees) Overall AROM Left Lower Extremity: Within functional limits for tasks assessed LLE Strength LLE Overall Strength: Within Functional Limits for tasks assessed The patient has mm tightness throughout B gastroc/soleus complex. Mobility (including Balance)     amb without AD  Exercise/Treatments Ankle  Exercises Ankle Plantar Flexion:  (stretch B 3x 30sec.) Manual Therapy Manual Therapy: Myofascial release Myofascial Release: massage to B Gastroc/soleus complex  Goals PT Short Term Goals Short Term Goal 1: I HEP Short Term Goal 2: Able to complete a day's work with pain no greater than a 5/10 PT Long Term Goals Long Term Goal 1: Pt able to complete a days work with pain no greater than 1/10 End of Session Patient Active Problem List  Diagnoses  . DM  . HYPERLIPIDEMIA  . OBESITY, UNSPECIFIED  . ANEMIA, IRON DEFICIENCY  . ANEMIA  . ALLERGIC RHINITIS, SEASONAL  . GERD  . VAGINITIS&VULVOVAGINITIS DISEASES CLASS ELSW  . PLANTAR FASCIITIS, TRAUMATIC  . FATIGUE  . Heel pain  . Pain in joint, ankle and foot   PT - End of Session Activity Tolerance: Patient tolerated treatment well General Behavior During Session: [facility] for tasks performed Cognition: [facility] for tasks performed PT Assessment and Plan Clinical Impression Statement: Pt with B plantar fasciitis.  Pain varies between a 0 and 9/10 who will benefit from skilled physical therapy to improve quality of life. Rehab Potential: Good PT Frequency: Min 2X/week PT Duration: 4 weeks PT Treatment/Interventions: Other (comment);Therapeutic exercise (manual massage to decrease spasm) PT Plan: begin Gastroc stretch/ SLS/ slant board next tx.  Time Calculation Start Time: 0325 Stop Time: 0408 Time Calculation (min): 43 min  RUSSELL,CINDY 11/27/2010, 5:00 PM

## 2010-12-01 ENCOUNTER — Ambulatory Visit (HOSPITAL_COMMUNITY)
Admission: RE | Admit: 2010-12-01 | Discharge: 2010-12-01 | Disposition: A | Discharge status: Home / Self Care | Payer: 59 | Source: Ambulatory Visit | Attending: Family Medicine | Admitting: Family Medicine

## 2010-12-01 DIAGNOSIS — M25579 Pain in unspecified ankle and joints of unspecified foot: Secondary | ICD-10-CM

## 2010-12-01 NOTE — Progress Notes (Signed)
Physical Therapy Treatment Patient Name: [name] ZOXWR'U Date: 12/01/2010  Time In: 3:30 Time Out: 3:57 Visit #:  2 out of 8 Next Re-eval: 12/25/2010 Charge: therex x 12 min Manual MFR 15 min  Subjective: Symptoms/Limitations Symptoms: Pt states min to no pain B feet. Pain Assessment Currently in Pain?: No/denies   Exercise/Treatments SLS R 8" L17" max of 3 Gastroc st B 3x 30" Soleus st B 3x 30" Plantar st 3x 30" Manual MFR B gastroc/soleus complex  Manual Therapy Manual Therapy: Other (comment) (Myofascial release) Myofascial Release: massage to B Gastroc/soleus complex  Goals   End of Session Patient Active Problem List  Diagnoses  . DM  . HYPERLIPIDEMIA  . OBESITY, UNSPECIFIED  . ANEMIA, IRON DEFICIENCY  . ANEMIA  . ALLERGIC RHINITIS, SEASONAL  . GERD  . VAGINITIS&VULVOVAGINITIS DISEASES CLASS ELSW  . PLANTAR FASCIITIS, TRAUMATIC  . FATIGUE  . Heel pain  . Pain in joint, ankle and foot   PT - End of Session Activity Tolerance: Patient tolerated treatment well General Behavior During Session: [facility] for tasks performed Cognition: [facility] for tasks performed PT Assessment and Plan Clinical Impression Statement: Reduced adhesions B proximal gastroc R>L.   PT Plan: Continue reducing adhesions B Gastroc, progress flexibility and balance.  Juel Burrow 12/01/2010, 4:01 PM

## 2010-12-05 ENCOUNTER — Ambulatory Visit (HOSPITAL_COMMUNITY)
Admission: RE | Admit: 2010-12-05 | Discharge: 2010-12-05 | Disposition: A | Discharge status: Home / Self Care | Payer: 59 | Source: Ambulatory Visit | Attending: Family Medicine | Admitting: Family Medicine

## 2010-12-05 NOTE — Progress Notes (Signed)
Physical Therapy Treatment Patient Name: [name] ZOXWR'U Date: 12/05/2010  Time In: 2:41 Time Out: 3:15 Visit #: 3 out of 8  Next Re-eval: 12/25/2010  Charge: therex x 20 min  Manual MFR 10 min   HPI: Symptoms/Limitations Symptoms: Pain is worse today. Pain Assessment Currently in Pain?: Yes Pain Score:   4 Pain Location: Heel Pain Orientation: Right   Exercise/Treatments   Heel Raises: 10 reps Toe Raise: 10 reps SLS R 18" L 21" max of 3  Gastroc st B 3x 30"  Soleus st B 3x 30"  Plantar st 3x 30"   Manual MFR B gastroc/soleus complex   Goals PT Short Term Goals Short Term Goal 1: I HEP Short Term Goal 1 Progress: Progressing toward goal Short Term Goal 2: Able to complete a day's work with pain no greater than a 5/10 Short Term Goal 2 Progress: Progressing toward goal PT Long Term Goals Long Term Goal 1: Pt able to complete a days work with pain no greater than 1/10 Long Term Goal 1 Progress: Progressing toward goal End of Session Patient Active Problem List  Diagnoses  . DM  . HYPERLIPIDEMIA  . OBESITY, UNSPECIFIED  . ANEMIA, IRON DEFICIENCY  . ANEMIA  . ALLERGIC RHINITIS, SEASONAL  . GERD  . VAGINITIS&VULVOVAGINITIS DISEASES CLASS ELSW  . PLANTAR FASCIITIS, TRAUMATIC  . FATIGUE  . Heel pain  . Pain in joint, ankle and foot   PT - End of Session Activity Tolerance: Patient tolerated treatment well General Behavior During Session: [facility] for tasks performed Cognition: [facility] for tasks performed PT Assessment and Plan Clinical Impression Statement: Pt with multiple spasms and adhesions on B gastroc soleus. Spasms decreased 50% after manual. Pt reports pain decrease to 1/10 after manual therapy. PT Treatment/Interventions: Therapeutic exercise;Other (comment) (Manual therapy) PT Plan: Continue to progress per PT POC.  Seth Bake [facility] 12/05/2010, 3:50 PM

## 2010-12-08 ENCOUNTER — Ambulatory Visit (HOSPITAL_COMMUNITY)
Admission: RE | Admit: 2010-12-08 | Discharge: 2010-12-08 | Disposition: A | Discharge status: Home / Self Care | Payer: 59 | Source: Ambulatory Visit | Attending: Family Medicine | Admitting: Family Medicine

## 2010-12-08 NOTE — Progress Notes (Signed)
Physical Therapy Treatment Patient Details  Name: [name] MRN: [id number] Date of Birth: 1976/03/29  Today's Date: 12/08/2010 Time: 4098-1191 Time Calculation (min): 38 min Visit#: 4 of 8 Re-eval: 12/25/10 Charges: Therex x 20' Manual therapy x12'  Subjective: Symptoms/Limitations Symptoms: Pt reports pain is not really there today. Pain Assessment Currently in Pain?: No/denies   Exercise/Treatments  Standing: Heel Raises: 10 reps  Toe Raise: 10 reps  SLS R 15" L 30"  Gastroc st B 3x 30"  Soleus st B 3x 30"  Plantar st 3x 30"    Manual Therapy Manual Therapy: Myofascial release (Myofascial release) Myofascial Release: massage to B Gastroc/soleus complex w/sustained stretch in prone; SCS to B gastroc  Physical Therapy Assessment and Plan PT Assessment and Plan Clinical Impression Statement: Pt continues to have multiple spasms in B gastroc/soleus; R>left. Utilized SCS technique to B gastrocs with results of decreased tenderness. PT Treatment/Interventions: Therapeutic exercise;Other (comment) (manual therapy) PT Plan: Continue to progress. Assess pain next tx.    Goals PT Short Term Goals PT Short Term Goal 1: I HEP PT Short Term Goal 1 - Progress: Progressing toward goal PT Short Term Goal 2: Able to complete a day's work with pain no greater than a 5/10 PT Short Term Goal 2 - Progress: Progressing toward goal PT Long Term Goals PT Long Term Goal 1: Pt able to complete a days work with pain no greater than 1/10 PT Long Term Goal 1 - Progress: Progressing toward goal  Problem List Patient Active Problem List  Diagnoses  . DM  . HYPERLIPIDEMIA  . OBESITY, UNSPECIFIED  . ANEMIA, IRON DEFICIENCY  . ANEMIA  . ALLERGIC RHINITIS, SEASONAL  . GERD  . VAGINITIS&VULVOVAGINITIS DISEASES CLASS ELSW  . PLANTAR FASCIITIS, TRAUMATIC  . FATIGUE  . Heel pain  . Pain in joint, ankle and foot    PT - End of Session Activity Tolerance: Patient tolerated treatment  well General Behavior During Session: [facility] for tasks performed Cognition: [facility] for tasks performed  Antonieta Iba 12/08/2010, 5:43 PM

## 2010-12-11 ENCOUNTER — Inpatient Hospital Stay (HOSPITAL_COMMUNITY): Admission: RE | Admit: 2010-12-11 | Payer: 59 | Source: Ambulatory Visit | Admitting: Physical Therapy

## 2010-12-14 ENCOUNTER — Ambulatory Visit (HOSPITAL_COMMUNITY)
Admission: RE | Admit: 2010-12-14 | Discharge: 2010-12-14 | Disposition: A | Discharge status: Home / Self Care | Payer: 59 | Source: Ambulatory Visit | Attending: *Deleted | Admitting: *Deleted

## 2010-12-14 DIAGNOSIS — M25579 Pain in unspecified ankle and joints of unspecified foot: Secondary | ICD-10-CM

## 2010-12-14 NOTE — Progress Notes (Signed)
Physical Therapy Treatment Patient Details  Name: [name] MRN: [id number] Date of Birth: 1975-09-02  Today's Date: 12/14/2010 Time: 1522-1600 Time Calculation (min): 38 min Visit#: 5 of 8 Re-eval: 12/25/10 Charge: therex 23 min Manual 15 min  Subjective: Symptoms/Limitations Symptoms: No pain right now, just off work Pain Assessment Currently in Pain?: No/denies  Objective:   Exercise/Treatments Heel Raises: 10 reps  Toe Raise: 10 reps  SLS R 36", L 21"  Gastroc st B 3x 30"  Soleus st B 3x 30"  Plantar st 3x 30"  Manual Therapy  Manual Therapy: Myofascial release (Myofascial release)  Myofascial Release: massage to B Gastroc/soleus complex w/sustained stretch in prone; SCS to B gastroc  Manual Therapy Manual Therapy:  (Myofascial release ) Myofascial Release: massage to B Gastroc/soleus complex w/sustained stretch in prone; SCS to B gastroc   Physical Therapy Assessment and Plan PT Assessment and Plan Clinical Impression Statement: Pt tolerated well towards total therex/manual.  Pt continues to have multiple spasms in B gastroc/soleus R>L, reduced tenderness following manual at end of session.  Balance improving B. PT Plan: Continue progressing, begin heel/toe walking and BAPS next session for increased ROM.    Goals    Problem List Patient Active Problem List  Diagnoses  . DM  . HYPERLIPIDEMIA  . OBESITY, UNSPECIFIED  . ANEMIA, IRON DEFICIENCY  . ANEMIA  . ALLERGIC RHINITIS, SEASONAL  . GERD  . VAGINITIS&VULVOVAGINITIS DISEASES CLASS ELSW  . PLANTAR FASCIITIS, TRAUMATIC  . FATIGUE  . Heel pain  . Pain in joint, ankle and foot    PT - End of Session Activity Tolerance: Patient tolerated treatment well General Behavior During Session: [facility] for tasks performed Cognition: [facility] for tasks performed  Juel Burrow 12/14/2010, 4:24 PM

## 2011-01-29 ENCOUNTER — Other Ambulatory Visit: Payer: Self-pay | Admitting: Family Medicine

## 2011-03-02 ENCOUNTER — Other Ambulatory Visit: Payer: Self-pay | Admitting: Family Medicine

## 2011-03-02 DIAGNOSIS — Z139 Encounter for screening, unspecified: Secondary | ICD-10-CM

## 2011-03-07 ENCOUNTER — Encounter: Payer: Self-pay | Admitting: Family Medicine

## 2011-03-08 ENCOUNTER — Ambulatory Visit (HOSPITAL_COMMUNITY)
Admission: RE | Admit: 2011-03-08 | Discharge: 2011-03-08 | Disposition: A | Discharge status: Home / Self Care | Payer: 59 | Source: Ambulatory Visit | Attending: Family Medicine | Admitting: Family Medicine

## 2011-03-08 DIAGNOSIS — Z139 Encounter for screening, unspecified: Secondary | ICD-10-CM

## 2011-03-08 DIAGNOSIS — Z1231 Encounter for screening mammogram for malignant neoplasm of breast: Secondary | ICD-10-CM | POA: Insufficient documentation

## 2011-03-09 ENCOUNTER — Ambulatory Visit: Payer: Self-pay | Admitting: Family Medicine

## 2011-04-19 ENCOUNTER — Other Ambulatory Visit: Payer: Self-pay | Admitting: Family Medicine

## 2011-04-23 ENCOUNTER — Encounter: Payer: Self-pay | Admitting: Family Medicine

## 2011-04-27 ENCOUNTER — Encounter: Payer: Self-pay | Admitting: Family Medicine

## 2011-04-30 ENCOUNTER — Encounter: Payer: Self-pay | Admitting: Family Medicine

## 2011-04-30 ENCOUNTER — Ambulatory Visit: Payer: 59 | Admitting: Family Medicine

## 2011-05-30 ENCOUNTER — Telehealth: Payer: Self-pay | Admitting: Family Medicine

## 2011-05-30 DIAGNOSIS — E669 Obesity, unspecified: Secondary | ICD-10-CM

## 2011-05-30 DIAGNOSIS — E119 Type 2 diabetes mellitus without complications: Secondary | ICD-10-CM

## 2011-05-30 DIAGNOSIS — D509 Iron deficiency anemia, unspecified: Secondary | ICD-10-CM

## 2011-05-30 DIAGNOSIS — R5381 Other malaise: Secondary | ICD-10-CM

## 2011-05-30 NOTE — Telephone Encounter (Signed)
What does she need ordered? COMING IN THE AM

## 2011-05-31 LAB — CBC WITH DIFFERENTIAL/PLATELET
Basophils Absolute: 0 10*3/uL (ref 0.0–0.1)
Basophils Relative: 0 % (ref 0–1)
Eosinophils Absolute: 0.1 10*3/uL (ref 0.0–0.7)
Eosinophils Relative: 2 % (ref 0–5)
HCT: 34.4 % — ABNORMAL LOW (ref 36.0–46.0)
Lymphocytes Relative: 29 % (ref 12–46)
MCH: 25.7 pg — ABNORMAL LOW (ref 26.0–34.0)
MCHC: 31.1 g/dL (ref 30.0–36.0)
MCV: 82.5 fL (ref 78.0–100.0)
Monocytes Absolute: 0.4 10*3/uL (ref 0.1–1.0)
RDW: 15 % (ref 11.5–15.5)

## 2011-05-31 LAB — BASIC METABOLIC PANEL
Calcium: 8.9 mg/dL (ref 8.4–10.5)
Chloride: 108 mEq/L (ref 96–112)
Creat: 0.59 mg/dL (ref 0.50–1.10)

## 2011-05-31 LAB — IRON AND TIBC
%SAT: 10 % — ABNORMAL LOW (ref 20–55)
Iron: 29 ug/dL — ABNORMAL LOW (ref 42–145)

## 2011-05-31 NOTE — Telephone Encounter (Signed)
Cbc and anemia panel, chem 7 and HBA1C

## 2011-05-31 NOTE — Telephone Encounter (Signed)
Faxed and put up front for pt also

## 2011-06-01 LAB — HEMOGLOBIN A1C: Mean Plasma Glucose: 137 mg/dL — ABNORMAL HIGH (ref <117)

## 2011-06-01 LAB — FERRITIN: Ferritin: 28 ng/mL (ref 10–291)

## 2011-06-04 ENCOUNTER — Encounter: Payer: Self-pay | Admitting: Family Medicine

## 2011-06-04 ENCOUNTER — Ambulatory Visit (INDEPENDENT_AMBULATORY_CARE_PROVIDER_SITE_OTHER): Payer: 59 | Admitting: Family Medicine

## 2011-06-04 VITALS — BP 118/70 | HR 74 | Resp 16 | Ht 62.0 in | Wt 335.0 lb

## 2011-06-04 DIAGNOSIS — M25569 Pain in unspecified knee: Secondary | ICD-10-CM

## 2011-06-04 DIAGNOSIS — D519 Vitamin B12 deficiency anemia, unspecified: Secondary | ICD-10-CM | POA: Insufficient documentation

## 2011-06-04 DIAGNOSIS — E119 Type 2 diabetes mellitus without complications: Secondary | ICD-10-CM

## 2011-06-04 DIAGNOSIS — M25562 Pain in left knee: Secondary | ICD-10-CM

## 2011-06-04 DIAGNOSIS — E669 Obesity, unspecified: Secondary | ICD-10-CM

## 2011-06-04 DIAGNOSIS — M722 Plantar fascial fibromatosis: Secondary | ICD-10-CM

## 2011-06-04 DIAGNOSIS — R5381 Other malaise: Secondary | ICD-10-CM

## 2011-06-04 DIAGNOSIS — D518 Other vitamin B12 deficiency anemias: Secondary | ICD-10-CM

## 2011-06-04 DIAGNOSIS — D509 Iron deficiency anemia, unspecified: Secondary | ICD-10-CM

## 2011-06-04 DIAGNOSIS — E538 Deficiency of other specified B group vitamins: Secondary | ICD-10-CM

## 2011-06-04 DIAGNOSIS — M25579 Pain in unspecified ankle and joints of unspecified foot: Secondary | ICD-10-CM

## 2011-06-04 DIAGNOSIS — E785 Hyperlipidemia, unspecified: Secondary | ICD-10-CM

## 2011-06-04 MED ORDER — CYANOCOBALAMIN 1000 MCG/ML IJ SOLN
1000.0000 ug | Freq: Once | INTRAMUSCULAR | Status: AC
  Administered 2011-06-04: 1000 ug via INTRAMUSCULAR

## 2011-06-04 MED ORDER — ESOMEPRAZOLE MAGNESIUM 40 MG PO CPDR: DELAYED_RELEASE_CAPSULE | ORAL | Status: DC

## 2011-06-04 MED ORDER — GLYBURIDE-METFORMIN 5-500 MG PO TABS: ORAL_TABLET | ORAL | Status: DC

## 2011-06-04 MED ORDER — FEXOFENADINE HCL 180 MG PO TABS: 180.0000 mg | ORAL_TABLET | Freq: Every day | ORAL | Status: DC

## 2011-06-04 MED ORDER — POLYSACCHARIDE IRON COMPLEX 150 MG PO CAPS: 150.0000 mg | ORAL_CAPSULE | Freq: Every day | ORAL | Status: DC

## 2011-06-04 MED ORDER — DICLOFENAC SODIUM 1 % TD GEL: TRANSDERMAL | Status: DC

## 2011-06-04 MED ORDER — IBUPROFEN 800 MG PO TABS: ORAL_TABLET | ORAL | Status: DC

## 2011-06-04 MED ORDER — FLUTICASONE PROPIONATE 50 MCG/ACT NA SUSP: 2.0000 | Freq: Every day | NASAL | Status: DC

## 2011-06-04 NOTE — Progress Notes (Signed)
  Subjective:    Patient ID: [name], female    DOB: October 28, 1975, 36 y.o.   MRN: [id number]  HPI The PT is here for follow up and re-evaluation of chronic medical conditions, medication management and review of any available recent lab and radiology data.  Preventive health is updated, specifically  Cancer screening and Immunization.   Questions or concerns regarding consultations or procedures which the PT has had in the interim are  addressed. The PT denies any adverse reactions to current medications since the last visit.  She c/o increased left knee pain and denies instability. She has worked on dietary change with success. Checks her blood sugar weekly on average her fasting sugars are about 120    Review of Systems    See HPI Denies recent fever or chills. Denies sinus pressure, nasal congestion, ear pain or sore throat. Denies chest congestion, productive cough or wheezing. Denies chest pains, palpitations and leg swelling Denies abdominal pain, nausea, vomiting,diarrhea or constipation.   Denies dysuria, frequency, hesitancy or incontinence.  Denies headaches, seizures, numbness, or tingling. Denies depression, anxiety or insomnia. Denies skin break down or rash.     Objective:   Physical Exam  Patient alert and oriented and in no cardiopulmonary distress.  HEENT: No facial asymmetry, EOMI, no sinus tenderness,  oropharynx pink and moist.  Neck supple no adenopathy.  Chest: Clear to auscultation bilaterally.  CVS: S1, S2 no murmurs, no S3.  ABD: Soft non tender. Bowel sounds normal.  Ext: No edema  MS: Adequate ROM spine, shoulders, hips and reduced in  knees.  Skin: Intact, no ulcerations or rash noted.  Psych: Good eye contact, normal affect. Memory intact not anxious or depressed appearing.  CNS: CN 2-12 intact, power, tone and sensation normal throughout.       Assessment & Plan:

## 2011-06-04 NOTE — Assessment & Plan Note (Signed)
improved

## 2011-06-04 NOTE — Assessment & Plan Note (Signed)
Current flare will prescribe anti inflammatory

## 2011-06-04 NOTE — Assessment & Plan Note (Signed)
Persists, but deficient in iron and B12, will start supplements

## 2011-06-04 NOTE — Assessment & Plan Note (Signed)
Pt to start B12 monthly

## 2011-06-04 NOTE — Assessment & Plan Note (Signed)
Improved. Pt applauded on succesful weight loss through lifestyle change, and encouraged to continue same. Weight loss goal set for the next several months.  

## 2011-06-04 NOTE — Assessment & Plan Note (Signed)
Deteriorated , needs to resume daily iron

## 2011-06-04 NOTE — Assessment & Plan Note (Addendum)
Recent flare, weight loss, ibuprofen or topical voltaren

## 2011-06-04 NOTE — Patient Instructions (Signed)
F/U in 5 months and 3 weeks  Nurse visit for B12 every 4 weeks  Congrats on weigh loss, keep it up.  Log into "my fitness pal"  An excellent free app which counts your calories.   Weight loss goal of 15 pounds, next 6 months, you lost 11 pounds this past time.   Fasting cbc, lipid cmp and EGFR, HBA1C B12 and iron, TSH and Vit D  Ibuprofen oR topical gel for left knee pain

## 2011-07-02 ENCOUNTER — Ambulatory Visit (INDEPENDENT_AMBULATORY_CARE_PROVIDER_SITE_OTHER): Payer: 59

## 2011-07-02 VITALS — BP 124/82 | Wt 337.1 lb

## 2011-07-02 DIAGNOSIS — D649 Anemia, unspecified: Secondary | ICD-10-CM

## 2011-07-02 MED ORDER — CYANOCOBALAMIN 1000 MCG/ML IJ SOLN
1000.0000 ug | Freq: Once | INTRAMUSCULAR | Status: AC
  Administered 2011-07-02: 1000 ug via INTRAMUSCULAR

## 2011-08-23 ENCOUNTER — Ambulatory Visit (INDEPENDENT_AMBULATORY_CARE_PROVIDER_SITE_OTHER): Payer: 59 | Admitting: Orthopedic Surgery

## 2011-08-23 ENCOUNTER — Encounter: Payer: Self-pay | Admitting: Orthopedic Surgery

## 2011-08-23 DIAGNOSIS — M67919 Unspecified disorder of synovium and tendon, unspecified shoulder: Secondary | ICD-10-CM

## 2011-08-23 DIAGNOSIS — M75101 Unspecified rotator cuff tear or rupture of right shoulder, not specified as traumatic: Secondary | ICD-10-CM | POA: Insufficient documentation

## 2011-08-23 NOTE — Progress Notes (Signed)
Patient ID: [name], female   DOB: 01-23-1976, 36 y.o.   MRN: [id number] Chief Complaint  Patient presents with  . 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 She did her routine gardening to start the spring but no other increase activity  No history of dislocation. No history of previous bursitis or rotator cuff syndrome symptoms.  She has a dull aching pain in the right shoulder associated with forward elevation and sleeping on her right side.  ROS:  Medical history was reviewed  Examination well-developed well-nourished elderly obese black female and hygiene normal. No gross deformities in the cervical spine or shoulder area. Cardiovascular exam normal pulse and perfusion and temperature in the right upper extremity Neurologic exam normal reflexes and sensation in the right upper extremity Skin normal and the right upper extremity Normal lymph nodes right upper extremity  Mental status normal  Musculoskeletal inspection there is tenderness around the glenohumeral joint and lateral deltoid. Her passive range of motion was normal but painful between 100 and 150 of forward elevation. She had decreased internal rotation.  She weeks of the supraspinatus. Normal internal and external rotation strength.  No instability was detected in abduction external rotation or with the inferior subluxation test  She did have positive impingement sign.  No x-ray was taken as there was no trauma she seems to have routine bursitis-like symptoms  Recommend subacromial injection and a stretching strengthening program a six-week followup.  Shoulder Injection Procedure Note   Pre-operative Diagnosis: right  RC Syndrome  Post-operative Diagnosis: same  Indications: pain   Anesthesia: ethyl chloride   Procedure Details    Verbal consent was obtained for the procedure. The shoulder was prepped withalcohol and the skin was anesthetized. A 20 gauge needle was advanced into the subacromial space through posterior approach without difficulty  The space was then injected with 3 ml 1% lidocaine and 1 ml of depomedrol. The injection site was cleansed with isopropyl alcohol and a dressing was applied.  Complications:  None; patient tolerated the procedure well.

## 2011-08-23 NOTE — Patient Instructions (Addendum)
You have received a steroid shot. 15% of patients experience increased pain at the injection site with in the next 24 hours. This is best treated with ice and tylenol extra strength 2 tabs every 8 hours. If you are still having pain please call the office.   Impingement Syndrome, Rotator Cuff, Bursitis with Rehab Impingement syndrome is a condition that involves inflammation of the tendons of the rotator cuff and the subacromial bursa, that causes pain in the shoulder. The rotator cuff consists of four tendons and muscles that control much of the shoulder and upper arm function. The subacromial bursa is a fluid filled sac that helps reduce friction between the rotator cuff and one of the bones of the shoulder (acromion). Impingement syndrome is usually an overuse injury that causes swelling of the bursa (bursitis), swelling of the tendon (tendonitis), and/or a tear of the tendon (strain). Strains are classified into three categories. Grade 1 strains cause pain, but the tendon is not lengthened. Grade 2 strains include a lengthened ligament, due to the ligament being stretched or partially ruptured. With grade 2 strains there is still function, although the function may be decreased. Grade 3 strains include a complete tear of the tendon or muscle, and function is usually impaired. SYMPTOMS    Pain around the shoulder, often at the outer portion of the upper arm.   Pain that gets worse with shoulder function, especially when reaching overhead or lifting.   Sometimes, aching when not using the arm.   Pain that wakes you up at night.   Sometimes, tenderness, swelling, warmth, or redness over the affected area.   Loss of strength.   Limited motion of the shoulder, especially reaching behind the back (to the back pocket or to unhook bra) or across your body.   Crackling sound (crepitation) when moving the arm.   Biceps tendon pain and inflammation (in the front of the shoulder). Worse when bending  the elbow or lifting.  CAUSES   Impingement syndrome is often an overuse injury, in which chronic (repetitive) motions cause the tendons or bursa to become inflamed. A strain occurs when a force is paced on the tendon or muscle that is greater than it can withstand. Common mechanisms of injury include: Stress from sudden increase in duration, frequency, or intensity of training.  Direct hit (trauma) to the shoulder.   Aging, erosion of the tendon with normal use.   Bony bump on shoulder (acromial spur).  RISK INCREASES WITH:  Contact sports (football, wrestling, boxing).   Throwing sports (baseball, tennis, volleyball).   Weightlifting and bodybuilding.   Heavy labor.   Previous injury to the rotator cuff, including impingement.   Poor shoulder strength and flexibility.   Failure to warm up properly before activity.   Inadequate protective equipment.   Old age.   Bony bump on shoulder (acromial spur).  PREVENTION    Warm up and stretch properly before activity.   Allow for adequate recovery between workouts.   Maintain physical fitness:   Strength, flexibility, and endurance.   Cardiovascular fitness.   Learn and use proper exercise technique.  PROGNOSIS   If treated properly, impingement syndrome usually goes away within 6 weeks. Sometimes surgery is required.   RELATED COMPLICATIONS    Longer healing time if not properly treated, or if not given enough time to heal.   Recurring symptoms, that result in a chronic condition.   Shoulder stiffness, frozen shoulder, or loss of motion.   Rotator cuff tendon tear.  Recurring symptoms, especially if activity is resumed too soon, with overuse, with a direct blow, or when using poor technique.  TREATMENT   Treatment first involves the use of ice and medicine, to reduce pain and inflammation. The use of strengthening and stretching exercises may help reduce pain with activity. These exercises may be performed at home  or with a therapist. If non-surgical treatment is unsuccessful after more than 6 months, surgery may be advised. After surgery and rehabilitation, activity is usually possible in 3 months.   MEDICATION  If pain medicine is needed, nonsteroidal anti-inflammatory medicines (aspirin and ibuprofen), or other minor pain relievers (acetaminophen), are often advised.   Do not take pain medicine for 7 days before surgery.   Prescription pain relievers may be given, if your caregiver thinks they are needed. Use only as directed and only as much as you need.   Corticosteroid injections may be given by your caregiver. These injections should be reserved for the most serious cases, because they may only be given a certain number of times.  HEAT AND COLD  Cold treatment (icing) should be applied for 10 to 15 minutes every 2 to 3 hours for inflammation and pain, and immediately after activity that aggravates your symptoms. Use ice packs or an ice massage.   Heat treatment may be used before performing stretching and strengthening activities prescribed by your caregiver, physical therapist, or athletic trainer. Use a heat pack or a warm water soak.  SEEK MEDICAL CARE IF:    Symptoms get worse or do not improve in 4 to 6 weeks, despite treatment.   New, unexplained symptoms develop. (Drugs used in treatment may produce side effects.)   Continue ALEVE AS NEEDED

## 2011-09-03 ENCOUNTER — Other Ambulatory Visit: Payer: Self-pay | Admitting: Family Medicine

## 2011-10-10 ENCOUNTER — Ambulatory Visit (INDEPENDENT_AMBULATORY_CARE_PROVIDER_SITE_OTHER): Payer: 59 | Admitting: Orthopedic Surgery

## 2011-10-10 ENCOUNTER — Encounter: Payer: Self-pay | Admitting: Orthopedic Surgery

## 2011-10-10 VITALS — Ht 62.0 in | Wt 337.0 lb

## 2011-10-10 DIAGNOSIS — M719 Bursopathy, unspecified: Secondary | ICD-10-CM

## 2011-10-10 DIAGNOSIS — M75101 Unspecified rotator cuff tear or rupture of right shoulder, not specified as traumatic: Secondary | ICD-10-CM

## 2011-10-10 NOTE — Progress Notes (Signed)
Patient ID: [name], female   DOB: 02-10-1976, 36 y.o.   MRN: [id number] Chief Complaint  Patient presents with  . Follow-up    6 week recheck on right shoulder.    Ht 5\' 2"  (1.575 m)  Wt 337 lb (152.862 kg)  BMI 61.64 kg/m2  Followup RIGHT rotator cuff syndrome did well until 2-3 days ago. Pain started back again.  Takes Motrin once a day. Does home exercises occasionally.  No provoking factors.  Impingement sign is positive at 150 of forward elevation. Rotator cuff strength grade 5.  Impingement syndrome recommend increasing frequency of exercises and take Motrin 800 mg 3 times a day for the next 2 weeks

## 2011-10-10 NOTE — Patient Instructions (Signed)
Motrin 3 x a day  Increase exercise frequency

## 2011-10-31 ENCOUNTER — Telehealth: Payer: Self-pay | Admitting: Family Medicine

## 2011-11-01 NOTE — Telephone Encounter (Signed)
Up front to be collected

## 2011-11-09 LAB — CBC
HCT: 34 % — ABNORMAL LOW (ref 36.0–46.0)
Hemoglobin: 10.6 g/dL — ABNORMAL LOW (ref 12.0–15.0)
MCV: 83.7 fL (ref 78.0–100.0)
RBC: 4.06 MIL/uL (ref 3.87–5.11)
WBC: 5.5 10*3/uL (ref 4.0–10.5)

## 2011-11-09 LAB — FERRITIN: Ferritin: 28 ng/mL (ref 10–291)

## 2011-11-09 LAB — COMPLETE METABOLIC PANEL WITH GFR
ALT: 9 U/L (ref 0–35)
Alkaline Phosphatase: 57 U/L (ref 39–117)
CO2: 25 mEq/L (ref 19–32)
Sodium: 137 mEq/L (ref 135–145)
Total Bilirubin: 0.3 mg/dL (ref 0.3–1.2)
Total Protein: 6.3 g/dL (ref 6.0–8.3)

## 2011-11-09 LAB — VITAMIN B12: Vitamin B-12: 201 pg/mL — ABNORMAL LOW (ref 211–911)

## 2011-11-22 ENCOUNTER — Encounter: Payer: Self-pay | Admitting: Family Medicine

## 2011-11-22 ENCOUNTER — Ambulatory Visit (INDEPENDENT_AMBULATORY_CARE_PROVIDER_SITE_OTHER): Payer: 59 | Admitting: Family Medicine

## 2011-11-22 VITALS — BP 120/70 | HR 75 | Resp 15 | Ht 62.0 in | Wt 342.1 lb

## 2011-11-22 DIAGNOSIS — M719 Bursopathy, unspecified: Secondary | ICD-10-CM

## 2011-11-22 DIAGNOSIS — E559 Vitamin D deficiency, unspecified: Secondary | ICD-10-CM

## 2011-11-22 DIAGNOSIS — J301 Allergic rhinitis due to pollen: Secondary | ICD-10-CM

## 2011-11-22 DIAGNOSIS — D519 Vitamin B12 deficiency anemia, unspecified: Secondary | ICD-10-CM

## 2011-11-22 DIAGNOSIS — D509 Iron deficiency anemia, unspecified: Secondary | ICD-10-CM

## 2011-11-22 DIAGNOSIS — K219 Gastro-esophageal reflux disease without esophagitis: Secondary | ICD-10-CM

## 2011-11-22 DIAGNOSIS — M75101 Unspecified rotator cuff tear or rupture of right shoulder, not specified as traumatic: Secondary | ICD-10-CM

## 2011-11-22 DIAGNOSIS — E538 Deficiency of other specified B group vitamins: Secondary | ICD-10-CM

## 2011-11-22 DIAGNOSIS — E119 Type 2 diabetes mellitus without complications: Secondary | ICD-10-CM

## 2011-11-22 DIAGNOSIS — E669 Obesity, unspecified: Secondary | ICD-10-CM

## 2011-11-22 DIAGNOSIS — D518 Other vitamin B12 deficiency anemias: Secondary | ICD-10-CM

## 2011-11-22 LAB — LIPID PANEL
LDL Cholesterol: 85 mg/dL (ref 0–99)
Total CHOL/HDL Ratio: 3 Ratio

## 2011-11-22 MED ORDER — ERGOCALCIFEROL 1.25 MG (50000 UT) PO CAPS: 50000.0000 [IU] | ORAL_CAPSULE | ORAL | Status: AC

## 2011-11-22 MED ORDER — CYANOCOBALAMIN 1000 MCG/ML IJ SOLN
1000.0000 ug | Freq: Once | INTRAMUSCULAR | Status: AC
  Administered 2011-11-22: 1000 ug via INTRAMUSCULAR

## 2011-11-22 MED ORDER — IBUPROFEN 800 MG PO TABS: ORAL_TABLET | ORAL | Status: DC

## 2011-11-22 NOTE — Patient Instructions (Addendum)
F/u in 4 month   You are referred to Dr Daphine Deutscher regarding weight management, and I will send him a note on you.  Blood sugar and blood pressure are good  Vit D and B12 are low. Start weekly oral vit D and monthly B12 injections in the office  Your iron stores are low, take ferrous sulfate one daily, call for meds this is OTC  I will refill ibuprofen  Fasting lipid panel today.Microalb sent from office  HBA1C in 4 month  Pap is past due please call for appointment

## 2011-11-23 LAB — MICROALBUMIN / CREATININE URINE RATIO: Microalb, Ur: 0.5 mg/dL (ref 0.00–1.89)

## 2011-11-25 NOTE — Assessment & Plan Note (Signed)
6 month minimum of weekly supplement, also encouraged to take daily multivit with D

## 2011-11-25 NOTE — Assessment & Plan Note (Signed)
Deteriorated. Patient re-educated about  the importance of commitment to a  minimum of 150 minutes of exercise per week. The importance of healthy food choices with portion control discussed. Encouraged to start a food diary, count calories and to consider  joining a support group. Sample diet sheets offered. Goals set by the patient for the next several months.   Refer to general surgeon, initial interest is in med management

## 2011-11-25 NOTE — Progress Notes (Signed)
  Subjective:    Patient ID: [name], female    DOB: 19-Jan-1976, 36 y.o.   MRN: [id number]  HPI The PT is here for follow up and re-evaluation of chronic medical conditions, medication management and review of any available recent lab and radiology data.  Preventive health is updated, specifically  Cancer screening and Immunization.   Questions or concerns regarding consultations or procedures which the PT has had in the interim are  Addressed.Improvemment in right shoulder pain after seeing ortho, however, still requests ibuprofen as needed The PT denies any adverse reactions to current medications since the last visit.  Continues to gain weight, interested in referral for more advanced medical management of obesity, and may even consider surgery if this does not work      Review of Systems See HPI Denies recent fever or chills. Denies sinus pressure, nasal congestion, ear pain or sore throat. Denies chest congestion, productive cough or wheezing. Denies chest pains, palpitations and leg swelling Denies abdominal pain, nausea, vomiting,diarrhea or constipation.   Denies dysuria, frequency, hesitancy or incontinence.  Denies headaches, seizures, numbness, or tingling. Denies depression, anxiety or insomnia. Denies skin break down or rash.        Objective:   Physical Exam  Patient alert and oriented and in no cardiopulmonary distress.  HEENT: No facial asymmetry, EOMI, no sinus tenderness,  oropharynx pink and moist.  Neck supple no adenopathy.  Chest: Clear to auscultation bilaterally.  CVS: S1, S2 no murmurs, no S3.  ABD: Soft non tender. Bowel sounds normal.  Ext: No edema  MS: Adequate ROM spine,  hips and knees.Reduced in right shoulder Skin: Intact, no ulcerations or rash noted.  Psych: Good eye contact, normal affect. Memory intact not anxious or depressed appearing.  CNS: CN 2-12 intact, power, tone and sensation normal throughout.         Assessment & Plan:

## 2011-11-25 NOTE — Assessment & Plan Note (Signed)
Controlled, no change in medication  

## 2011-11-25 NOTE — Assessment & Plan Note (Signed)
Continue daily iron

## 2011-11-25 NOTE — Assessment & Plan Note (Signed)
Controlled, no change in medication Patient educated about the importance of limiting  Carbohydrate intake , the need to commit to daily physical activity for a minimum of 30 minutes , and to commit weight loss. The fact that changes in all these areas will reduce or eliminate all together the development of diabetes is stressed.    

## 2011-11-25 NOTE — Assessment & Plan Note (Signed)
Pt to start once monthly B12 injections

## 2011-11-25 NOTE — Assessment & Plan Note (Signed)
Improved after ortho management, requests refill of ibuprofen for daily , as needed use

## 2011-11-25 NOTE — Assessment & Plan Note (Signed)
Controlled, no change in medication , as needed med only

## 2011-11-28 ENCOUNTER — Ambulatory Visit (INDEPENDENT_AMBULATORY_CARE_PROVIDER_SITE_OTHER): Payer: 59 | Admitting: Orthopedic Surgery

## 2011-11-28 ENCOUNTER — Encounter: Payer: Self-pay | Admitting: Orthopedic Surgery

## 2011-11-28 VITALS — BP 100/60 | Ht 62.0 in | Wt 342.0 lb

## 2011-11-28 DIAGNOSIS — M67919 Unspecified disorder of synovium and tendon, unspecified shoulder: Secondary | ICD-10-CM

## 2011-11-28 DIAGNOSIS — M75101 Unspecified rotator cuff tear or rupture of right shoulder, not specified as traumatic: Secondary | ICD-10-CM

## 2011-11-28 NOTE — Patient Instructions (Signed)
activities as tolerated 

## 2011-11-28 NOTE — Progress Notes (Signed)
Patient ID: [name], female   DOB: Feb 23, 1976, 36 y.o.   MRN: [id number] Chief Complaint  Patient presents with  . Follow-up    recheck right shoulder    BP 100/60  Ht 5\' 2"  (1.575 m)  Wt 342 lb (155.13 kg)  BMI 62.55 kg/m2  Rotator cuff syndrome right shoulder treated with exercises and injection  The patient reports no symptoms at present  Review of systems is negative for numbness tingling or weakness  Exam appearance is normal vital signs are stable full range of motion. Normal strength. No instability no tenderness. Skin is normal. Neurovascular exam intact  Resolved impingement syndrome  Activities as tolerated followup as needed

## 2011-12-20 ENCOUNTER — Ambulatory Visit (INDEPENDENT_AMBULATORY_CARE_PROVIDER_SITE_OTHER): Payer: 59

## 2011-12-20 VITALS — BP 124/68 | Wt 345.1 lb

## 2011-12-20 DIAGNOSIS — D518 Other vitamin B12 deficiency anemias: Secondary | ICD-10-CM

## 2011-12-20 MED ORDER — CYANOCOBALAMIN 1000 MCG/ML IJ SOLN
1000.0000 ug | Freq: Once | INTRAMUSCULAR | Status: AC
  Administered 2011-12-20: 1000 ug via INTRAMUSCULAR

## 2011-12-20 NOTE — Progress Notes (Signed)
Patient in for monthly b12 injection.  Injection given in right gluteal muscle.  No sign or symptom of adverse reaction.  No voiced complaints.  Patient aware that she should return in a month for next injection.

## 2011-12-31 ENCOUNTER — Encounter (INDEPENDENT_AMBULATORY_CARE_PROVIDER_SITE_OTHER): Payer: Self-pay | Admitting: Surgery

## 2011-12-31 ENCOUNTER — Ambulatory Visit (INDEPENDENT_AMBULATORY_CARE_PROVIDER_SITE_OTHER): Payer: Commercial Managed Care - PPO | Admitting: Surgery

## 2011-12-31 DIAGNOSIS — K2100 Gastro-esophageal reflux disease with esophagitis, without bleeding: Secondary | ICD-10-CM

## 2011-12-31 DIAGNOSIS — Z6841 Body Mass Index (BMI) 40.0 and over, adult: Secondary | ICD-10-CM

## 2011-12-31 DIAGNOSIS — E119 Type 2 diabetes mellitus without complications: Secondary | ICD-10-CM

## 2011-12-31 MED ORDER — PHENTERMINE HCL 30 MG PO CAPS: 30.0000 mg | ORAL_CAPSULE | ORAL | Status: DC

## 2011-12-31 NOTE — Progress Notes (Signed)
Chief Complaint:  Super morbid obesity BMI 64  History of Present Illness:  [name] is an 36 y.o. female was referred by Dr. Syliva Overman for medical management of extreme weight.  She is a very nice lady with a BMI of 64 who has tried multiple attempts at weight loss. We discussed her comorbidities which include diabetes but she doesn't have hypertension. She is interested in phentermine and I went ahead and included the instructions with her after visit summary and went over those with her. I had a sample of 7 tablets to began and I gave her a prescription for 30 more.  After this we discussed surgical interventions including the band bypass and sleeve. She said she might be interested in this and I think it depends on how she responds to the medication if we can get her BMI significantly reduced observing lower or risk of surgery.  I will see her back in a month and we will see how she is doing with a phentermine.  Past Medical History  Diagnosis Date  . Diabetes mellitus   . Allergy   . GERD (gastroesophageal reflux disease)   . Anemia     Past Surgical History  Procedure Date  . Cesarean section     x 2    Current Outpatient Prescriptions  Medication Sig Dispense Refill  . BYDUREON 2 MG SUSR INJECT 2 MG SUBCUTANEOUSLY AT ANY TIME OF THE DAY WITH OR WITHOUT MEALS ONCE WEEKLY (DISCONTINUE BYETTA)  12 each  3  . diclofenac sodium (VOLTAREN) 1 % GEL Apply to affected knee, as needed for knee pain  300 g  1  . ergocalciferol (VITAMIN D2) 50000 UNITS capsule Take 1 capsule (50,000 Units total) by mouth once a week. One capsule once weekly  12 capsule  1  . esomeprazole (NEXIUM) 40 MG capsule TAKE 1 CAPSULE BY MOUTH ONCE DAILY  90 capsule  1  . fexofenadine (ALLEGRA) 180 MG tablet Take 1 tablet (180 mg total) by mouth daily.  90 tablet  1  . fluticasone (FLONASE) 50 MCG/ACT nasal spray Place 2 sprays into the nose daily.  48 g  1  . glyBURIDE-metformin (GLUCOVANCE) 5-500 MG  per tablet TAKE 2 TABLETS BY MOUTH TWICE DAILY  360 tablet  1  . ibuprofen (ADVIL,MOTRIN) 800 MG tablet One atr bedtime , as needed , for severe pain  90 tablet  0  . iron polysaccharides (NU-IRON) 150 MG capsule Take 1 capsule (150 mg total) by mouth daily.  90 capsule  3  . phentermine 30 MG capsule Take 1 capsule (30 mg total) by mouth every morning.  30 capsule  1   Review of patient's allergies indicates no known allergies. Family History  Problem Relation Age of Onset  . Hypertension Mother   . COPD Father   . Diabetes Father   . Heart disease Father   . Hypertension Father   . Kidney disease Father   . Stroke Father    Social History:   reports that she has never smoked. She does not have any smokeless tobacco history on file. She reports that she drinks alcohol. She reports that she does not use illicit drugs.   REVIEW OF SYSTEMS - PERTINENT POSITIVES ONLY: Negative for DVT  Physical Exam:   Blood pressure 132/78, pulse 68, temperature 97.6 F (36.4 C), temperature source Temporal, resp. rate 18, height 5\' 2"  (1.575 m), weight 350 lb 6.4 oz (158.94 kg). Body mass index is 64.09 kg/(m^2).  Gen:  WDWN AAF NAD  Neurological: Alert and oriented to person, place, and time. Motor and sensory function is grossly intact  Head: Normocephalic and atraumatic.  Eyes: Conjunctivae are normal. Pupils are equal, round, and reactive to light. No scleral icterus.  Neck: Normal range of motion. Neck supple. No tracheal deviation or thyromegaly present.  Cardiovascular:  SR without murmurs or gallops.  No carotid bruits Respiratory: Effort normal.  No respiratory distress. No chest wall tenderness. Breath sounds normal.  No wheezes, rales or rhonchi.  Abdomen:  obese GU: Musculoskeletal: Normal range of motion. Extremities are nontender. No cyanosis, edema or clubbing noted Lymphadenopathy: No cervical, preauricular, postauricular or axillary adenopathy is present Skin: Skin is warm and  dry. No rash noted. No diaphoresis. No erythema. No pallor. Pscyh: Normal mood and affect. Behavior is normal. Judgment and thought content normal.   LABORATORY RESULTS: No results found for this or any previous visit (from the past 48 hour(s)).  RADIOLOGY RESULTS: No results found.  Problem List: Patient Active Problem List  Diagnosis  . DM  . Obesity, morbid  . ANEMIA, IRON DEFICIENCY  . ANEMIA  . ALLERGIC RHINITIS, SEASONAL  . GERD  . VAGINITIS&VULVOVAGINITIS DISEASES CLASS ELSW  . PLANTAR FASCIITIS, TRAUMATIC  . FATIGUE  . Heel pain  . Knee pain, left  . B12 deficiency anemia  . Rotator cuff syndrome of right shoulder  . Vitamin B 12 deficiency  . Vitamin d deficiency    Assessment & Plan: Supermorbid obesity:  To begin medical therapy with Phentermine to suppress appetite.  I gave her additional information on dietary changes and including exercise.      Matt B. Daphine Deutscher, MD, [facility] [phone] beeper [phone]  12/31/2011 11:33 AM

## 2011-12-31 NOTE — Patient Instructions (Signed)
Phentermine orally disintegrating tablets (ODT) What is this medicine? PHENTERMINE (FEN ter meen) decreases your appetite. It is used with a reduced calorie diet and exercise to help you lose weight. This medicine may be used for other purposes; ask your health care provider or pharmacist if you have questions. What should I tell my health care provider before I take this medicine? They need to know if you have any of these conditions: -agitation -glaucoma -heart disease -high blood pressure -history of substance abuse -lung disease called Primary Pulmonary Hypertension (PPH) -taken an MAOI like Carbex, Eldepryl, Marplan, Nardil, or Parnate in last 14 days -thyroid disease -an unusual or allergic reaction to phentermine, other medicines, foods, dyes, or preservatives -pregnant or trying to get pregnant -breast-feeding How should I use this medicine? Take this medicine by mouth. Follow the directions on the prescription label. The tablets should stay in the bottle until immediately before you take your dose. Use dry hands to remove the dose from the bottle. Do not cut or split the tablets in half. Place the tablet in the mouth and allow it to dissolve, and then swallow. While you may take these tablets with water, it is not necessary to do so. Take your doses at regular intervals. Do not take your medicine more often than directed. Do not stop taking except on the advice of your doctor or health care professional. Talk to your pediatrician regarding the use of this medicine in children. Special care may be needed. Overdosage: If you think you've taken too much of this medicine contact a poison control center or emergency room at once. Overdosage: If you think you have taken too much of this medicine contact a poison control center or emergency room at once. NOTE: This medicine is only for you. Do not share this medicine with others. What if I miss a dose? If you miss a dose, take it as soon as  you can. If it is almost time for your next dose, take only that dose. Do not take double or extra doses. What may interact with this medicine? Do not take this medicine with any of the following medications: -duloxetine -MAOIs like Carbex, Eldepryl, Marplan, Nardil, and Parnate -medicines for colds or breathing difficulties like pseudoephedrine or phenylephrine -procarbazine -sibutramine -SSRIs like citalopram, escitalopram, fluoxetine, fluvoxamine, paroxetine, and sertraline -stimulants like dexmethylphenidate, methylphenidate or modafinil -venlafaxine  This medicine may also interact with the following medications: -medicines for diabetes This list may not describe all possible interactions. Give your health care provider a list of all the medicines, herbs, non-prescription drugs, or dietary supplements you use. Also tell them if you smoke, drink alcohol, or use illegal drugs. Some items may interact with your medicine. What should I watch for while using this medicine? Notify your physician immediately if you become short of breath while doing your normal activities. Do not take this medicine within 6 hours of bedtime. It can keep you from getting to sleep. Avoid drinks that contain caffeine and try to stick to a regular bedtime every night. This medicine is intended to be used in addition to a healthy diet and exercise. The best results are achieved this way. This medicine is only indicated for short-term use. Eventually your weight loss may level out. At that point, the drug will only help you maintain your new weight. Do not increase or in any way change your dose without consulting your doctor. You may get drowsy or dizzy. Do not drive, use machinery, or do anything that  needs mental alertness until you know how this medicine affects you. Do not stand or sit up quickly, especially if you are an older patient. This reduces the risk of dizzy or fainting spells. Alcohol may increase dizziness  and drowsiness. Avoid alcoholic drinks. What side effects may I notice from receiving this medicine? Side effects that you should report to your doctor or health care professional as soon as possible: -chest pain, palpitations -depression or severe changes in mood -increased blood pressure -irritability -nervousness or restlessness -severe dizziness -shortness of breath -problems urinating -unusual swelling of the legs -vomiting  Side effects that usually do not require medical attention (report to your doctor or health care professional if they continue or are bothersome): -blurred vision or other eye problems -changes in sexual ability or desire -constipation or diarrhea -difficulty sleeping -dry mouth or unpleasant taste -headache -nausea This list may not describe all possible side effects. Call your doctor for medical advice about side effects. You may report side effects to FDA at 1-800-FDA-1088. Where should I keep my medicine? Keep out of the reach of children. This medicine can be abused. Keep your medicine in a safe place to protect it from theft. Do not share this medicine with anyone. Selling or giving away this medicine is dangerous and against the law. Store at room temperature between 20 and 25 degrees C (68 and 77 degrees F). Keep container tightly closed. Throw away any unused medicine after the expiration date. NOTE: This sheet is a summary. It may not cover all possible information. If you have questions about this medicine, talk to your doctor, pharmacist, or health care provider.  2012, [employer]. (05/24/2010 10:40:21 AM)

## 2012-02-14 ENCOUNTER — Ambulatory Visit (INDEPENDENT_AMBULATORY_CARE_PROVIDER_SITE_OTHER): Payer: Commercial Managed Care - PPO | Admitting: Surgery

## 2012-02-14 ENCOUNTER — Encounter (INDEPENDENT_AMBULATORY_CARE_PROVIDER_SITE_OTHER): Payer: Self-pay | Admitting: Surgery

## 2012-02-14 DIAGNOSIS — Z6841 Body Mass Index (BMI) 40.0 and over, adult: Secondary | ICD-10-CM

## 2012-02-14 NOTE — Patient Instructions (Signed)
Continue Phentermine Phentermine tablets or capsules What is this medicine? PHENTERMINE (FEN ter meen) decreases your appetite. It is used with a reduced calorie diet and exercise to help you lose weight. This medicine may be used for other purposes; ask your health care provider or pharmacist if you have questions. What should I tell my health care provider before I take this medicine? They need to know if you have any of these conditions: -agitation -glaucoma -heart disease -high blood pressure -history of substance abuse -lung disease called Primary Pulmonary Hypertension (PPH) -taken an MAOI like Carbex, Eldepryl, Marplan, Nardil, or Parnate in last 14 days -thyroid disease -an unusual or allergic reaction to phentermine, other medicines, foods, dyes, or preservatives -pregnant or trying to get pregnant -breast-feeding How should I use this medicine? Take this medicine by mouth with a glass of water. Follow the directions on the prescription label. This medicine is usually taken 30 minutes before or 1 to 2 hours after breakfast. Avoid taking this medicine in the evening. It may interfere with sleep. Take your doses at regular intervals. Do not take your medicine more often than directed. Talk to your pediatrician regarding the use of this medicine in children. Special care may be needed. Overdosage: If you think you have taken too much of this medicine contact a poison control center or emergency room at once. NOTE: This medicine is only for you. Do not share this medicine with others. What if I miss a dose? If you miss a dose, take it as soon as you can. If it is almost time for your next dose, take only that dose. Do not take double or extra doses. What may interact with this medicine? Do not take this medicine with any of the following medications: -duloxetine -MAOIs like Carbex, Eldepryl, Marplan, Nardil, and Parnate -medicines for colds or breathing difficulties like  pseudoephedrine or phenylephrine -procarbazine -sibutramine -SSRIs like citalopram, escitalopram, fluoxetine, fluvoxamine, paroxetine, and sertraline -stimulants like dexmethylphenidate, methylphenidate or modafinil -venlafaxine This medicine may also interact with the following medications: -medicines for diabetes This list may not describe all possible interactions. Give your health care provider a list of all the medicines, herbs, non-prescription drugs, or dietary supplements you use. Also tell them if you smoke, drink alcohol, or use illegal drugs. Some items may interact with your medicine. What should I watch for while using this medicine? Notify your physician immediately if you become short of breath while doing your normal activities. Do not take this medicine within 6 hours of bedtime. It can keep you from getting to sleep. Avoid drinks that contain caffeine and try to stick to a regular bedtime every night. This medicine was intended to be used in addition to a healthy diet and exercise. The best results are achieved this way. This medicine is only indicated for short-term use. Eventually your weight loss may level out. At that point, the drug will only help you maintain your new weight. Do not increase or in any way change your dose without consulting your doctor. You may get drowsy or dizzy. Do not drive, use machinery, or do anything that needs mental alertness until you know how this medicine affects you. Do not stand or sit up quickly, especially if you are an older patient. This reduces the risk of dizzy or fainting spells. Alcohol may increase dizziness and drowsiness. Avoid alcoholic drinks. What side effects may I notice from receiving this medicine? Side effects that you should report to your doctor or health care professional  as soon as possible: -chest pain, palpitations -depression or severe changes in mood -increased blood pressure -irritability -nervousness or  restlessness -severe dizziness -shortness of breath -problems urinating -unusual swelling of the legs -vomiting Side effects that usually do not require medical attention (report to your doctor or health care professional if they continue or are bothersome): -blurred vision or other eye problems -changes in sexual ability or desire -constipation or diarrhea -difficulty sleeping -dry mouth or unpleasant taste -headache -nausea This list may not describe all possible side effects. Call your doctor for medical advice about side effects. You may report side effects to FDA at 1-800-FDA-1088. Where should I keep my medicine? Keep out of the reach of children. This medicine can be abused. Keep your medicine in a safe place to protect it from theft. Do not share this medicine with anyone. Selling or giving away this medicine is dangerous and against the law. Store at room temperature between 20 and 25 degrees C (68 and 77 degrees F). Keep container tightly closed. Throw away any unused medicine after the expiration date. NOTE: This sheet is a summary. It may not cover all possible information. If you have questions about this medicine, talk to your doctor, pharmacist, or health care provider.  2012, Elsevier/Gold Standard. (05/24/2010 11:02:44 AM)

## 2012-02-14 NOTE — Progress Notes (Signed)
[name] 36 y.o.  Body mass index is 60.47 kg/(m^2).  Patient Active Problem List  Diagnosis  . DM  . Obesity, morbid  . ANEMIA, IRON DEFICIENCY  . ANEMIA  . ALLERGIC RHINITIS, SEASONAL  . GERD  . VAGINITIS&VULVOVAGINITIS DISEASES CLASS ELSW  . PLANTAR FASCIITIS, TRAUMATIC  . FATIGUE  . Heel pain  . Knee pain, left  . B12 deficiency anemia  . Rotator cuff syndrome of right shoulder  . Vitamin B 12 deficiency  . Vitamin d deficiency    No Known Allergies  Past Surgical History  Procedure Date  . Cesarean section     x 2   Syliva Overman, MD No diagnosis found.  Doing well with Phentermine 30 mg.  Only side effect was dry mouth.  Has lost 20 lbs and her appetite is effected.  She would like to continue and I will see her back in 6 weeks.  Impression:  Has lost 20 lbs on phentermine.  Continue. Matt B. Daphine Deutscher, MD, [facility] [phone] beeper [phone]  02/14/2012 9:38 AM

## 2012-03-19 ENCOUNTER — Other Ambulatory Visit (INDEPENDENT_AMBULATORY_CARE_PROVIDER_SITE_OTHER): Payer: Self-pay | Admitting: Surgery

## 2012-03-19 MED ORDER — PHENTERMINE HCL 30 MG PO CAPS: 30.0000 mg | ORAL_CAPSULE | ORAL | Status: DC

## 2012-03-24 ENCOUNTER — Ambulatory Visit: Payer: 59 | Admitting: Family Medicine

## 2012-04-03 ENCOUNTER — Encounter (INDEPENDENT_AMBULATORY_CARE_PROVIDER_SITE_OTHER): Payer: Commercial Managed Care - PPO | Admitting: Surgery

## 2012-04-07 ENCOUNTER — Telehealth: Payer: Self-pay | Admitting: Family Medicine

## 2012-04-07 DIAGNOSIS — E119 Type 2 diabetes mellitus without complications: Secondary | ICD-10-CM

## 2012-04-07 NOTE — Telephone Encounter (Signed)
Will fax to lab  

## 2012-04-10 ENCOUNTER — Encounter: Payer: Self-pay | Admitting: Family Medicine

## 2012-04-10 ENCOUNTER — Ambulatory Visit (INDEPENDENT_AMBULATORY_CARE_PROVIDER_SITE_OTHER): Payer: 59 | Admitting: Family Medicine

## 2012-04-10 VITALS — BP 120/82 | HR 86 | Resp 16 | Ht 62.0 in | Wt 325.0 lb

## 2012-04-10 DIAGNOSIS — E559 Vitamin D deficiency, unspecified: Secondary | ICD-10-CM

## 2012-04-10 DIAGNOSIS — D509 Iron deficiency anemia, unspecified: Secondary | ICD-10-CM

## 2012-04-10 DIAGNOSIS — R5383 Other fatigue: Secondary | ICD-10-CM

## 2012-04-10 DIAGNOSIS — E119 Type 2 diabetes mellitus without complications: Secondary | ICD-10-CM

## 2012-04-10 DIAGNOSIS — R5381 Other malaise: Secondary | ICD-10-CM

## 2012-04-10 DIAGNOSIS — J301 Allergic rhinitis due to pollen: Secondary | ICD-10-CM

## 2012-04-10 LAB — CBC WITH DIFFERENTIAL/PLATELET
Basophils Relative: 1 % (ref 0–1)
Eosinophils Absolute: 0.1 10*3/uL (ref 0.0–0.7)
Eosinophils Relative: 2 % (ref 0–5)
Lymphs Abs: 2.3 10*3/uL (ref 0.7–4.0)
MCH: 26.5 pg (ref 26.0–34.0)
MCHC: 32 g/dL (ref 30.0–36.0)
MCV: 82.8 fL (ref 78.0–100.0)
Platelets: 346 10*3/uL (ref 150–400)
RBC: 4.37 MIL/uL (ref 3.87–5.11)

## 2012-04-10 MED ORDER — FLUTICASONE PROPIONATE 50 MCG/ACT NA SUSP: 2.0000 | Freq: Every day | NASAL | Status: DC

## 2012-04-10 NOTE — Patient Instructions (Addendum)
F/U in 4 month, call if you need me before  Blood pressure is excellent  Chem 7 , HBA1C, cbc, B12, iron and ferritin, vit D  Congrats on 25 pound weight loss in 3 months, keep it up  It is important that you exercise regularly at least 30 minutes 5 times a week. If you develop chest pain, have severe difficulty breathing, or feel very tired, stop exercising immediately and seek medical attention   Please keep eye exam   HBA1C and hem 7 and CBc non fasting in 4 month  Please log into my chart

## 2012-04-10 NOTE — Progress Notes (Signed)
  Subjective:    Patient ID: [name], female    DOB: 1975-11-04, 36 y.o.   MRN: [id number]  HPI The PT is here for follow up and re-evaluation of chronic medical conditions, medication management and review of any available recent lab and radiology data.  Preventive health is updated, specifically  Cancer screening and Immunization.   Questions or concerns regarding consultations or procedures which the PT has had in the interim are  Addressed.Happy with being a party of the weight loss surgical team as far as support with nutrition and medication, feels this has been very beneficial The PT denies any adverse reactions to current medications since the last visit.  There are no new concerns.  There are no specific complaints , still needs eye exam     Review of Systems See HPI Denies recent fever or chills. Denies sinus pressure, nasal congestion, ear pain or sore throat. Denies chest congestion, productive cough or wheezing. Denies chest pains, palpitations and leg swelling Denies abdominal pain, nausea, vomiting,diarrhea or constipation.   Denies dysuria, frequency, hesitancy or incontinence. Denies joint pain, swelling and limitation in mobility. Denies headaches, seizures, numbness, or tingling. Denies depression, anxiety or insomnia. Denies skin break down or rash.        Objective:   Physical Exam Patient alert and oriented and in no cardiopulmonary distress.  HEENT: No facial asymmetry, EOMI, no sinus tenderness,  oropharynx pink and moist.  Neck supple no adenopathy.  Chest: Clear to auscultation bilaterally.  CVS: S1, S2 no murmurs, no S3.  ABD: Soft non tender. Bowel sounds normal.  Ext: No edema  MS: Adequate ROM spine, shoulders, hips and knees.  Skin: Intact, no ulcerations or rash noted.  Psych: Good eye contact, normal affect. Memory intact not anxious or depressed appearing.  CNS: CN 2-12 intact, power, tone and sensation normal  throughout.        Assessment & Plan:

## 2012-04-11 LAB — BASIC METABOLIC PANEL
BUN: 13 mg/dL (ref 6–23)
CO2: 24 mEq/L (ref 19–32)
Calcium: 8.7 mg/dL (ref 8.4–10.5)
Creat: 0.54 mg/dL (ref 0.50–1.10)
Glucose, Bld: 156 mg/dL — ABNORMAL HIGH (ref 70–99)
Sodium: 135 mEq/L (ref 135–145)

## 2012-04-11 LAB — IRON: Iron: 40 ug/dL — ABNORMAL LOW (ref 42–145)

## 2012-04-17 ENCOUNTER — Encounter: Payer: Self-pay | Admitting: Family Medicine

## 2012-04-25 ENCOUNTER — Other Ambulatory Visit: Payer: Self-pay | Admitting: Family Medicine

## 2012-04-29 ENCOUNTER — Ambulatory Visit (INDEPENDENT_AMBULATORY_CARE_PROVIDER_SITE_OTHER): Payer: Commercial Managed Care - PPO | Admitting: Surgery

## 2012-04-29 ENCOUNTER — Encounter (INDEPENDENT_AMBULATORY_CARE_PROVIDER_SITE_OTHER): Payer: Self-pay | Admitting: Surgery

## 2012-04-29 DIAGNOSIS — Z6841 Body Mass Index (BMI) 40.0 and over, adult: Secondary | ICD-10-CM

## 2012-04-29 NOTE — Progress Notes (Signed)
I. [name] is doing well taken the phentermine. Since September she has lost 30 pounds and is now down to BMI 58. The only side effect that  she appears to have is a dry mouth.  She is doing well so far. We will re\re new her phentermine when needed I plan to see her again in 3 months.

## 2012-04-29 NOTE — Patient Instructions (Signed)
Phentermine orally disintegrating tablets (ODT) What is this medicine? PHENTERMINE (FEN ter meen) decreases your appetite. It is used with a reduced calorie diet and exercise to help you lose weight. This medicine may be used for other purposes; ask your health care provider or pharmacist if you have questions. What should I tell my health care provider before I take this medicine? They need to know if you have any of these conditions: -agitation -glaucoma -heart disease -high blood pressure -history of substance abuse -lung disease called Primary Pulmonary Hypertension (PPH) -taken an MAOI like Carbex, Eldepryl, Marplan, Nardil, or Parnate in last 14 days -thyroid disease -an unusual or allergic reaction to phentermine, other medicines, foods, dyes, or preservatives -pregnant or trying to get pregnant -breast-feeding How should I use this medicine? Take this medicine by mouth. Follow the directions on the prescription label. The tablets should stay in the bottle until immediately before you take your dose. Use dry hands to remove the dose from the bottle. Do not cut or split the tablets in half. Place the tablet in the mouth and allow it to dissolve, and then swallow. While you may take these tablets with water, it is not necessary to do so. Take your doses at regular intervals. Do not take your medicine more often than directed. Do not stop taking except on the advice of your doctor or health care professional. Talk to your pediatrician regarding the use of this medicine in children. Special care may be needed. Overdosage: If you think you've taken too much of this medicine contact a poison control center or emergency room at once. Overdosage: If you think you have taken too much of this medicine contact a poison control center or emergency room at once. NOTE: This medicine is only for you. Do not share this medicine with others. What if I miss a dose? If you miss a dose, take it as soon as  you can. If it is almost time for your next dose, take only that dose. Do not take double or extra doses. What may interact with this medicine? Do not take this medicine with any of the following medications: -duloxetine -MAOIs like Carbex, Eldepryl, Marplan, Nardil, and Parnate -medicines for colds or breathing difficulties like pseudoephedrine or phenylephrine -procarbazine -sibutramine -SSRIs like citalopram, escitalopram, fluoxetine, fluvoxamine, paroxetine, and sertraline -stimulants like dexmethylphenidate, methylphenidate or modafinil -venlafaxine  This medicine may also interact with the following medications: -medicines for diabetes This list may not describe all possible interactions. Give your health care provider a list of all the medicines, herbs, non-prescription drugs, or dietary supplements you use. Also tell them if you smoke, drink alcohol, or use illegal drugs. Some items may interact with your medicine. What should I watch for while using this medicine? Notify your physician immediately if you become short of breath while doing your normal activities. Do not take this medicine within 6 hours of bedtime. It can keep you from getting to sleep. Avoid drinks that contain caffeine and try to stick to a regular bedtime every night. This medicine is intended to be used in addition to a healthy diet and exercise. The best results are achieved this way. This medicine is only indicated for short-term use. Eventually your weight loss may level out. At that point, the drug will only help you maintain your new weight. Do not increase or in any way change your dose without consulting your doctor. You may get drowsy or dizzy. Do not drive, use machinery, or do anything that  needs mental alertness until you know how this medicine affects you. Do not stand or sit up quickly, especially if you are an older patient. This reduces the risk of dizzy or fainting spells. Alcohol may increase dizziness  and drowsiness. Avoid alcoholic drinks. What side effects may I notice from receiving this medicine? Side effects that you should report to your doctor or health care professional as soon as possible: -chest pain, palpitations -depression or severe changes in mood -increased blood pressure -irritability -nervousness or restlessness -severe dizziness -shortness of breath -problems urinating -unusual swelling of the legs -vomiting  Side effects that usually do not require medical attention (report to your doctor or health care professional if they continue or are bothersome): -blurred vision or other eye problems -changes in sexual ability or desire -constipation or diarrhea -difficulty sleeping -dry mouth or unpleasant taste -headache -nausea This list may not describe all possible side effects. Call your doctor for medical advice about side effects. You may report side effects to FDA at 1-800-FDA-1088. Where should I keep my medicine? Keep out of the reach of children. This medicine can be abused. Keep your medicine in a safe place to protect it from theft. Do not share this medicine with anyone. Selling or giving away this medicine is dangerous and against the law. Store at room temperature between 20 and 25 degrees C (68 and 77 degrees F). Keep container tightly closed. Throw away any unused medicine after the expiration date. NOTE: This sheet is a summary. It may not cover all possible information. If you have questions about this medicine, talk to your doctor, pharmacist, or health care provider.  2013, Elsevier/Gold Standard. (05/24/2010 10:40:21 AM)

## 2012-04-30 ENCOUNTER — Other Ambulatory Visit: Payer: Self-pay | Admitting: Family Medicine

## 2012-04-30 ENCOUNTER — Telehealth: Payer: Self-pay

## 2012-04-30 NOTE — Telephone Encounter (Signed)
Per [facility]- Nexium is increasing from 0 to $25 copay. Pt wants to try omeprazole 40 daily

## 2012-04-30 NOTE — Telephone Encounter (Signed)
pls advise her in its place pantoprazole(protonix) ia a zero copay, I suggest she try that , will enter, fax if she agrees

## 2012-05-01 MED ORDER — OMEPRAZOLE 40 MG PO CPDR: 40.0000 mg | DELAYED_RELEASE_CAPSULE | Freq: Every day | ORAL | Status: DC

## 2012-05-01 NOTE — Telephone Encounter (Signed)
pls send in the omeprazole she wishes and  thanks

## 2012-05-01 NOTE — Telephone Encounter (Signed)
Med sent in.

## 2012-05-01 NOTE — Telephone Encounter (Signed)
States she has tried protonix in the past and it didn't work. Wants to try a 30 day supply of the omeprazole 40 at [facility] pharmacy and if it doesn't work she will just stay with the nexium

## 2012-05-04 NOTE — Assessment & Plan Note (Signed)
Controlled, no change in medication  

## 2012-05-04 NOTE — Assessment & Plan Note (Signed)
Improved. Pt applauded on succesful weight loss through lifestyle change, and encouraged to continue same. Weight loss goal set for the next several months. Reports good benefit from weight loss center

## 2012-05-04 NOTE — Assessment & Plan Note (Signed)
Pt needs to commit to taking supplement as prescribed

## 2012-05-04 NOTE — Assessment & Plan Note (Signed)
Controlled, no change in medication Patient advised to reduce carb and sweets, commit to regular physical activity, take meds as prescribed, test blood as directed, and attempt to lose weight, to improve blood sugar control.  

## 2012-05-14 ENCOUNTER — Other Ambulatory Visit: Payer: Self-pay | Admitting: Family Medicine

## 2012-06-05 ENCOUNTER — Other Ambulatory Visit: Payer: Self-pay | Admitting: Family Medicine

## 2012-06-07 ENCOUNTER — Other Ambulatory Visit: Payer: Self-pay

## 2012-06-09 MED ORDER — OMEPRAZOLE 40 MG PO CPDR: 40.0000 mg | DELAYED_RELEASE_CAPSULE | Freq: Every day | ORAL | Status: DC

## 2012-06-09 NOTE — Telephone Encounter (Signed)
Refill sent.

## 2012-08-07 ENCOUNTER — Ambulatory Visit: Payer: 59 | Admitting: Family Medicine

## 2012-08-13 ENCOUNTER — Telehealth (INDEPENDENT_AMBULATORY_CARE_PROVIDER_SITE_OTHER): Payer: Self-pay

## 2012-08-13 NOTE — Telephone Encounter (Signed)
Faxed refill for Phentermine #30 w/ no refills to pt's pharmacy - [facility] - [phone]

## 2012-08-13 NOTE — Telephone Encounter (Signed)
LMOM letting pt know that I have sent in her Rxl for phentermine #30 no refills to the pharmacy we have listed for her: [facility]

## 2012-08-23 LAB — BASIC METABOLIC PANEL
BUN: 12 mg/dL (ref 6–23)
Potassium: 4.7 mEq/L (ref 3.5–5.3)
Sodium: 137 mEq/L (ref 135–145)

## 2012-08-23 LAB — HEMOGLOBIN A1C
Hgb A1c MFr Bld: 5.9 % — ABNORMAL HIGH (ref <5.7)
Mean Plasma Glucose: 123 mg/dL — ABNORMAL HIGH (ref <117)

## 2012-08-24 LAB — CBC WITH DIFFERENTIAL/PLATELET
Eosinophils Relative: 1 % (ref 0–5)
HCT: 33.7 % — ABNORMAL LOW (ref 36.0–46.0)
Hemoglobin: 11.1 g/dL — ABNORMAL LOW (ref 12.0–15.0)
Lymphocytes Relative: 27 % (ref 12–46)
Lymphs Abs: 2 10*3/uL (ref 0.7–4.0)
MCV: 80.8 fL (ref 78.0–100.0)
Monocytes Relative: 5 % (ref 3–12)
Platelets: 316 10*3/uL (ref 150–400)
RBC: 4.17 MIL/uL (ref 3.87–5.11)
WBC: 7.2 10*3/uL (ref 4.0–10.5)

## 2012-08-25 ENCOUNTER — Ambulatory Visit (INDEPENDENT_AMBULATORY_CARE_PROVIDER_SITE_OTHER): Payer: 59 | Admitting: Family Medicine

## 2012-08-25 ENCOUNTER — Encounter: Payer: Self-pay | Admitting: Family Medicine

## 2012-08-25 VITALS — BP 130/80 | HR 81 | Resp 16 | Wt 318.0 lb

## 2012-08-25 DIAGNOSIS — K219 Gastro-esophageal reflux disease without esophagitis: Secondary | ICD-10-CM

## 2012-08-25 DIAGNOSIS — E559 Vitamin D deficiency, unspecified: Secondary | ICD-10-CM

## 2012-08-25 DIAGNOSIS — R5381 Other malaise: Secondary | ICD-10-CM

## 2012-08-25 DIAGNOSIS — D519 Vitamin B12 deficiency anemia, unspecified: Secondary | ICD-10-CM

## 2012-08-25 DIAGNOSIS — E119 Type 2 diabetes mellitus without complications: Secondary | ICD-10-CM

## 2012-08-25 DIAGNOSIS — Z79899 Other long term (current) drug therapy: Secondary | ICD-10-CM

## 2012-08-25 DIAGNOSIS — D509 Iron deficiency anemia, unspecified: Secondary | ICD-10-CM

## 2012-08-25 DIAGNOSIS — D649 Anemia, unspecified: Secondary | ICD-10-CM

## 2012-08-25 DIAGNOSIS — D518 Other vitamin B12 deficiency anemias: Secondary | ICD-10-CM

## 2012-08-25 DIAGNOSIS — R5383 Other fatigue: Secondary | ICD-10-CM

## 2012-08-25 NOTE — Patient Instructions (Addendum)
F/u in 4 month   Congrats on weight loss, keep it up!  Reduce glyburide/metformin to 3 tablets daily for the next month, 2 in the morning and one at dinner. Test blood suagrs at least once daily, different times. If sugars are within range after 4 weeks reduce to one tablet twice daily.Call with questions or concerns  Call with question  Goal for fasting blood sugar ranges from 80 to 120 and 2 hours after any meal or at bedtime should be between 130 to 170.  You arre encouraged to commit to daily exercise, this will positively affect your health   Fasting lipid, cmp and EGFR, vit D ,HB and iron, microalb and HBA1C  In 4 months, before next visit.  PLEASE take iron tablets one twice daily, you are anemic and iron is low

## 2012-08-25 NOTE — Progress Notes (Signed)
  Subjective:    Patient ID: [name], female    DOB: 09-09-1975, 37 y.o.   MRN: [id number]  HPI The PT is here for follow up and re-evaluation of chronic medical conditions, medication management and review of any available recent lab and radiology data.  Preventive health is updated, specifically  Cancer screening and Immunization.   Questions or concerns regarding consultations or procedures which the PT has had in the interim are  Addressed.Being followed at bariatric center for wweight loss with excellent results The PT denies any adverse reactions to current medications since the last visit.  There are no new concerns.  There are no specific complaints       Review of Systems See HPI Denies recent fever or chills. Denies sinus pressure, nasal congestion, ear pain or sore throat. Denies chest congestion, productive cough or wheezing. Denies chest pains, palpitations and leg swelling Denies abdominal pain, nausea, vomiting,diarrhea or constipation.   Denies dysuria, frequency, hesitancy or incontinence. Denies joint pain, swelling and limitation in mobility. Denies headaches, seizures, numbness, or tingling. Denies depression, anxiety or insomnia. Denies skin break down or rash.        Objective:   Physical Exam  Patient alert and oriented and in no cardiopulmonary distress.  HEENT: No facial asymmetry, EOMI, no sinus tenderness,  oropharynx pink and moist.  Neck supple no adenopathy.  Chest: Clear to auscultation bilaterally.  CVS: S1, S2 no murmurs, no S3.  ABD: Soft non tender. Bowel sounds normal.  Ext: No edema  MS: Adequate ROM spine, shoulders, hips and knees.  Skin: Intact, no ulcerations or rash noted.  Psych: Good eye contact, normal affect. Memory intact not anxious or depressed appearing.  CNS: CN 2-12 intact, power, tone and sensation normal throughout.       Assessment & Plan:

## 2012-08-29 ENCOUNTER — Ambulatory Visit (INDEPENDENT_AMBULATORY_CARE_PROVIDER_SITE_OTHER): Payer: Commercial Managed Care - PPO | Admitting: Surgery

## 2012-09-07 NOTE — Assessment & Plan Note (Signed)
Controlled, no change in medication  

## 2012-09-07 NOTE — Assessment & Plan Note (Signed)
Improved and controlled, no med change Patient advised to reduce carb and sweets, commit to regular physical activity, take meds as prescribed, test blood as directed, and attempt to lose weight, to improve blood sugar control.  

## 2012-09-07 NOTE — Assessment & Plan Note (Signed)
Needs updtae lab, has ongoing iron deficiency anemia

## 2012-09-07 NOTE — Assessment & Plan Note (Signed)
Deteriorated. Patient re-educated about  the importance of commitment to a  minimum of 150 minutes of exercise per week. The importance of healthy food choices with portion control discussed. Encouraged to start a food diary, count calories and to consider  joining a support group. Sample diet sheets offered. Goals set by the patient for the next several months.    

## 2012-09-18 ENCOUNTER — Other Ambulatory Visit: Payer: Self-pay

## 2012-09-18 MED ORDER — EXENATIDE ER 2 MG ~~LOC~~ SUSR: 2.0000 mg | Freq: Every day | SUBCUTANEOUS | Status: DC

## 2012-09-24 ENCOUNTER — Ambulatory Visit (INDEPENDENT_AMBULATORY_CARE_PROVIDER_SITE_OTHER): Payer: Commercial Managed Care - PPO | Admitting: Surgery

## 2012-09-24 ENCOUNTER — Encounter (INDEPENDENT_AMBULATORY_CARE_PROVIDER_SITE_OTHER): Payer: Self-pay | Admitting: Surgery

## 2012-09-24 NOTE — Progress Notes (Signed)
[name] 37 y.o.  Body mass index is 57.31 kg/(m^2).  Patient Active Problem List   Diagnosis Date Noted  . Vitamin B 12 deficiency 11/22/2011  . Vitamin D deficiency 11/22/2011  . Rotator cuff syndrome of right shoulder 08/23/2011  . Knee pain, left 06/04/2011  . B12 deficiency anemia 06/04/2011  . Heel pain 11/06/2010  . FATIGUE 11/28/2009  . PLANTAR FASCIITIS, TRAUMATIC 03/15/2009  . VAGINITIS&VULVOVAGINITIS DISEASES CLASS ELSW 03/26/2008  . DM 09/03/2007  . Obesity, morbid 09/03/2007  . ANEMIA, IRON DEFICIENCY 09/03/2007  . ALLERGIC RHINITIS, SEASONAL 09/03/2007  . GERD 09/03/2007    No Known Allergies  Past Surgical History  Procedure Laterality Date  . Cesarean section      x 2   Syliva Overman, MD No diagnosis found.  followup for weight loss using phentermine. Has lost almost 40 pounds since her last visit taking phentermine 30 mg daily. She still has one prescription left. I talked to her about Qysemia (phentermine and Topamax) to see if she is interested and if her insurance covers.  If so will transition to that drug when present script runs out.  Otherwise, I will see back in 3 months.  She is not having any complaints thought secondary to her phentermine.    Matt B. Daphine Deutscher, MD, [facility] [phone] beeper [phone]  09/24/2012 11:36 AM

## 2012-09-24 NOTE — Patient Instructions (Signed)
Phentermine tablets or capsules What is this medicine? PHENTERMINE (FEN ter meen) decreases your appetite. It is used with a reduced calorie diet and exercise to help you lose weight. This medicine may be used for other purposes; ask your health care provider or pharmacist if you have questions. What should I tell my health care provider before I take this medicine? They need to know if you have any of these conditions: -agitation -glaucoma -heart disease -high blood pressure -history of substance abuse -lung disease called Primary Pulmonary Hypertension (PPH) -taken an MAOI like Carbex, Eldepryl, Marplan, Nardil, or Parnate in last 14 days -thyroid disease -an unusual or allergic reaction to phentermine, other medicines, foods, dyes, or preservatives -pregnant or trying to get pregnant -breast-feeding How should I use this medicine? Take this medicine by mouth with a glass of water. Follow the directions on the prescription label. This medicine is usually taken 30 minutes before or 1 to 2 hours after breakfast. Avoid taking this medicine in the evening. It may interfere with sleep. Take your doses at regular intervals. Do not take your medicine more often than directed. Talk to your pediatrician regarding the use of this medicine in children. Special care may be needed. Overdosage: If you think you have taken too much of this medicine contact a poison control center or emergency room at once. NOTE: This medicine is only for you. Do not share this medicine with others. What if I miss a dose? If you miss a dose, take it as soon as you can. If it is almost time for your next dose, take only that dose. Do not take double or extra doses. What may interact with this medicine? Do not take this medicine with any of the following medications: -duloxetine -MAOIs like Carbex, Eldepryl, Marplan, Nardil, and Parnate -medicines for colds or breathing difficulties like pseudoephedrine or  phenylephrine -procarbazine -sibutramine -SSRIs like citalopram, escitalopram, fluoxetine, fluvoxamine, paroxetine, and sertraline -stimulants like dexmethylphenidate, methylphenidate or modafinil -venlafaxine This medicine may also interact with the following medications: -medicines for diabetes This list may not describe all possible interactions. Give your health care provider a list of all the medicines, herbs, non-prescription drugs, or dietary supplements you use. Also tell them if you smoke, drink alcohol, or use illegal drugs. Some items may interact with your medicine. What should I watch for while using this medicine? Notify your physician immediately if you become short of breath while doing your normal activities. Do not take this medicine within 6 hours of bedtime. It can keep you from getting to sleep. Avoid drinks that contain caffeine and try to stick to a regular bedtime every night. This medicine was intended to be used in addition to a healthy diet and exercise. The best results are achieved this way. This medicine is only indicated for short-term use. Eventually your weight loss may level out. At that point, the drug will only help you maintain your new weight. Do not increase or in any way change your dose without consulting your doctor. You may get drowsy or dizzy. Do not drive, use machinery, or do anything that needs mental alertness until you know how this medicine affects you. Do not stand or sit up quickly, especially if you are an older patient. This reduces the risk of dizzy or fainting spells. Alcohol may increase dizziness and drowsiness. Avoid alcoholic drinks. What side effects may I notice from receiving this medicine? Side effects that you should report to your doctor or health care professional as soon  as possible: -chest pain, palpitations -depression or severe changes in mood -increased blood pressure -irritability -nervousness or restlessness -severe  dizziness -shortness of breath -problems urinating -unusual swelling of the legs -vomiting Side effects that usually do not require medical attention (report to your doctor or health care professional if they continue or are bothersome): -blurred vision or other eye problems -changes in sexual ability or desire -constipation or diarrhea -difficulty sleeping -dry mouth or unpleasant taste -headache -nausea This list may not describe all possible side effects. Call your doctor for medical advice about side effects. You may report side effects to FDA at 1-800-FDA-1088. Where should I keep my medicine? Keep out of the reach of children. This medicine can be abused. Keep your medicine in a safe place to protect it from theft. Do not share this medicine with anyone. Selling or giving away this medicine is dangerous and against the law. Store at room temperature between 20 and 25 degrees C (68 and 77 degrees F). Keep container tightly closed. Throw away any unused medicine after the expiration date. NOTE: This sheet is a summary. It may not cover all possible information. If you have questions about this medicine, talk to your doctor, pharmacist, or health care provider.  2012, Elsevier/Gold Standard. (05/24/2010 11:02:44 AM)

## 2012-10-16 ENCOUNTER — Encounter: Payer: Self-pay | Admitting: Family Medicine

## 2012-10-26 ENCOUNTER — Encounter (INDEPENDENT_AMBULATORY_CARE_PROVIDER_SITE_OTHER): Payer: Self-pay | Admitting: Surgery

## 2012-10-27 ENCOUNTER — Telehealth (INDEPENDENT_AMBULATORY_CARE_PROVIDER_SITE_OTHER): Payer: Self-pay | Admitting: General Surgery

## 2012-10-27 NOTE — Telephone Encounter (Signed)
LMOM that pt that she needs to make a f/u appt with Daphine Deutscher MD for a B/P and Weight check before a refill is authorized for Phentermine per Daphine Deutscher MD. Pt was last seen 09/24/2012 and had refill left then.

## 2012-10-30 NOTE — Telephone Encounter (Signed)
Patient called back regarding below message.  Spoke with Dr. Daphine Deutscher who states patient can just come in for a nurse only visit to have her weight and BP checked and documented.  Patient states she has the day off on Monday so she will call then to get an appt.

## 2012-11-03 ENCOUNTER — Ambulatory Visit (INDEPENDENT_AMBULATORY_CARE_PROVIDER_SITE_OTHER): Payer: Commercial Managed Care - PPO

## 2012-11-03 DIAGNOSIS — R634 Abnormal weight loss: Secondary | ICD-10-CM

## 2012-11-03 NOTE — Progress Notes (Unsigned)
Wt 316.2lb  Bp 138/80   Patient states she is doing good

## 2012-11-24 ENCOUNTER — Encounter (INDEPENDENT_AMBULATORY_CARE_PROVIDER_SITE_OTHER): Payer: Self-pay | Admitting: Surgery

## 2012-12-08 ENCOUNTER — Other Ambulatory Visit: Payer: Self-pay

## 2012-12-08 MED ORDER — OMEPRAZOLE 40 MG PO CPDR: 40.0000 mg | DELAYED_RELEASE_CAPSULE | Freq: Every day | ORAL | Status: DC

## 2012-12-08 MED ORDER — GLYBURIDE-METFORMIN 5-500 MG PO TABS: ORAL_TABLET | ORAL | Status: DC

## 2012-12-24 ENCOUNTER — Ambulatory Visit (INDEPENDENT_AMBULATORY_CARE_PROVIDER_SITE_OTHER): Payer: Commercial Managed Care - PPO | Admitting: Surgery

## 2012-12-24 ENCOUNTER — Encounter (INDEPENDENT_AMBULATORY_CARE_PROVIDER_SITE_OTHER): Payer: Self-pay | Admitting: Surgery

## 2012-12-24 DIAGNOSIS — Z6841 Body Mass Index (BMI) 40.0 and over, adult: Secondary | ICD-10-CM

## 2012-12-24 MED ORDER — PHENTERMINE HCL 30 MG PO CAPS: 30.0000 mg | ORAL_CAPSULE | ORAL | Status: DC

## 2012-12-24 NOTE — Patient Instructions (Signed)
Phentermine orally disintegrating tablets (ODT) What is this medicine? PHENTERMINE (FEN ter meen) decreases your appetite. It is used with a reduced calorie diet and exercise to help you lose weight. This medicine may be used for other purposes; ask your health care provider or pharmacist if you have questions. What should I tell my health care provider before I take this medicine? They need to know if you have any of these conditions: -agitation -glaucoma -heart disease -high blood pressure -history of substance abuse -lung disease called Primary Pulmonary Hypertension (PPH) -taken an MAOI like Carbex, Eldepryl, Marplan, Nardil, or Parnate in last 14 days -thyroid disease -an unusual or allergic reaction to phentermine, other medicines, foods, dyes, or preservatives -pregnant or trying to get pregnant -breast-feeding How should I use this medicine? Take this medicine by mouth. Follow the directions on the prescription label. The tablets should stay in the bottle until immediately before you take your dose. Use dry hands to remove the dose from the bottle. Do not cut or split the tablets in half. Place the tablet in the mouth and allow it to dissolve, and then swallow. While you may take these tablets with water, it is not necessary to do so. Take your doses at regular intervals. Do not take your medicine more often than directed. Do not stop taking except on the advice of your doctor or health care professional. Talk to your pediatrician regarding the use of this medicine in children. Special care may be needed. Overdosage: If you think you've taken too much of this medicine contact a poison control center or emergency room at once. Overdosage: If you think you have taken too much of this medicine contact a poison control center or emergency room at once. NOTE: This medicine is only for you. Do not share this medicine with others. What if I miss a dose? If you miss a dose, take it as soon as  you can. If it is almost time for your next dose, take only that dose. Do not take double or extra doses. What may interact with this medicine? Do not take this medicine with any of the following medications: -duloxetine -MAOIs like Carbex, Eldepryl, Marplan, Nardil, and Parnate -medicines for colds or breathing difficulties like pseudoephedrine or phenylephrine -procarbazine -sibutramine -SSRIs like citalopram, escitalopram, fluoxetine, fluvoxamine, paroxetine, and sertraline -stimulants like dexmethylphenidate, methylphenidate or modafinil -venlafaxine  This medicine may also interact with the following medications: -medicines for diabetes This list may not describe all possible interactions. Give your health care provider a list of all the medicines, herbs, non-prescription drugs, or dietary supplements you use. Also tell them if you smoke, drink alcohol, or use illegal drugs. Some items may interact with your medicine. What should I watch for while using this medicine? Notify your physician immediately if you become short of breath while doing your normal activities. Do not take this medicine within 6 hours of bedtime. It can keep you from getting to sleep. Avoid drinks that contain caffeine and try to stick to a regular bedtime every night. This medicine is intended to be used in addition to a healthy diet and exercise. The best results are achieved this way. This medicine is only indicated for short-term use. Eventually your weight loss may level out. At that point, the drug will only help you maintain your new weight. Do not increase or in any way change your dose without consulting your doctor. You may get drowsy or dizzy. Do not drive, use machinery, or do anything that  needs mental alertness until you know how this medicine affects you. Do not stand or sit up quickly, especially if you are an older patient. This reduces the risk of dizzy or fainting spells. Alcohol may increase dizziness  and drowsiness. Avoid alcoholic drinks. What side effects may I notice from receiving this medicine? Side effects that you should report to your doctor or health care professional as soon as possible: -chest pain, palpitations -depression or severe changes in mood -increased blood pressure -irritability -nervousness or restlessness -severe dizziness -shortness of breath -problems urinating -unusual swelling of the legs -vomiting  Side effects that usually do not require medical attention (report to your doctor or health care professional if they continue or are bothersome): -blurred vision or other eye problems -changes in sexual ability or desire -constipation or diarrhea -difficulty sleeping -dry mouth or unpleasant taste -headache -nausea This list may not describe all possible side effects. Call your doctor for medical advice about side effects. You may report side effects to FDA at 1-800-FDA-1088. Where should I keep my medicine? Keep out of the reach of children. This medicine can be abused. Keep your medicine in a safe place to protect it from theft. Do not share this medicine with anyone. Selling or giving away this medicine is dangerous and against the law. Store at room temperature between 20 and 25 degrees C (68 and 77 degrees F). Keep container tightly closed. Throw away any unused medicine after the expiration date. NOTE: This sheet is a summary. It may not cover all possible information. If you have questions about this medicine, talk to your doctor, pharmacist, or health care provider.  2013, Elsevier/Gold Standard. (05/24/2010 10:40:21 AM)

## 2012-12-24 NOTE — Progress Notes (Signed)
[name] 37 y.o.  Body mass index is 57.56 kg/(m^2).  Patient Active Problem List   Diagnosis Date Noted  . Vitamin B 12 deficiency 11/22/2011  . Vitamin D deficiency 11/22/2011  . Rotator cuff syndrome of right shoulder 08/23/2011  . Knee pain, left 06/04/2011  . B12 deficiency anemia 06/04/2011  . Heel pain 11/06/2010  . FATIGUE 11/28/2009  . PLANTAR FASCIITIS, TRAUMATIC 03/15/2009  . VAGINITIS&VULVOVAGINITIS DISEASES CLASS ELSW 03/26/2008  . DM 09/03/2007  . Obesity, morbid 09/03/2007  . ANEMIA, IRON DEFICIENCY 09/03/2007  . ALLERGIC RHINITIS, SEASONAL 09/03/2007  . GERD 09/03/2007    No Known Allergies  Past Surgical History  Procedure Laterality Date  . Cesarean section      x 2   Syliva Overman, MD No diagnosis found.  This month Ms. Samarin has been on phentermine for about a year. She takes 30 mg a day and has lost 35 pounds. I think we will need to do a vacation from the phentermine and we'll taper this over the next 2 months by giving her a prescription for 30 mg to take every other day. At the end of that time I want to see her back and reevaluate where she is going. I may give her another month vacation if we want to consider placing her back on Phentermine. Aside from a dry mouth she is not experiencing any side effects.   Matt B. Daphine Deutscher, MD, [facility] [phone] beeper [phone]  12/24/2012 4:30 PM

## 2012-12-31 ENCOUNTER — Other Ambulatory Visit: Payer: Self-pay | Admitting: Family Medicine

## 2012-12-31 LAB — COMPLETE METABOLIC PANEL WITH GFR
AST: 10 U/L (ref 0–37)
Albumin: 3.7 g/dL (ref 3.5–5.2)
Alkaline Phosphatase: 55 U/L (ref 39–117)
Calcium: 8.6 mg/dL (ref 8.4–10.5)
Chloride: 109 mEq/L (ref 96–112)
GFR, Est Non African American: >89 mL/min
Glucose, Bld: 80 mg/dL (ref 70–99)
Potassium: 4.3 mEq/L (ref 3.5–5.3)
Sodium: 139 mEq/L (ref 135–145)
Total Protein: 6.2 g/dL (ref 6.0–8.3)

## 2012-12-31 LAB — HEMOGLOBIN: Hemoglobin: 11.1 g/dL — ABNORMAL LOW (ref 12.0–15.0)

## 2012-12-31 LAB — LIPID PANEL
Cholesterol: 122 mg/dL (ref 0–200)
HDL: 43 mg/dL (ref >39)
Total CHOL/HDL Ratio: 2.8 Ratio
VLDL: 8 mg/dL (ref 0–40)

## 2013-01-01 ENCOUNTER — Encounter: Payer: Self-pay | Admitting: Family Medicine

## 2013-01-01 ENCOUNTER — Ambulatory Visit (INDEPENDENT_AMBULATORY_CARE_PROVIDER_SITE_OTHER): Payer: 59 | Admitting: Family Medicine

## 2013-01-01 VITALS — BP 118/74 | HR 82 | Resp 16 | Wt 316.1 lb

## 2013-01-01 DIAGNOSIS — E119 Type 2 diabetes mellitus without complications: Secondary | ICD-10-CM

## 2013-01-01 DIAGNOSIS — J301 Allergic rhinitis due to pollen: Secondary | ICD-10-CM

## 2013-01-01 DIAGNOSIS — D509 Iron deficiency anemia, unspecified: Secondary | ICD-10-CM

## 2013-01-01 DIAGNOSIS — E559 Vitamin D deficiency, unspecified: Secondary | ICD-10-CM

## 2013-01-01 LAB — TSH: TSH: 2.338 u[IU]/mL (ref 0.350–4.500)

## 2013-01-01 MED ORDER — METFORMIN HCL 1000 MG PO TABS: 1000.0000 mg | ORAL_TABLET | Freq: Two times a day (BID) | ORAL | Status: DC

## 2013-01-01 MED ORDER — ERGOCALCIFEROL 1.25 MG (50000 UT) PO CAPS: 50000.0000 [IU] | ORAL_CAPSULE | ORAL | Status: DC

## 2013-01-01 NOTE — Patient Instructions (Addendum)
F/u in 3 month,  You will need to be in touch in 2 weeks on line or by phone re blood sugars  New is invokana 100mg  one daily for 2 weeks, if no problem will increase to standard treating dose of 300mg  one daly. You will get coupon today to activate for a 1 year free supply when needed. We will provide samples of invokana 100mg  for up to 4 weeks  It is important that you exercise regularly at least 30 minutes 5 times a week. If you develop chest pain, have severe difficulty breathing, or feel very tired, stop exercising immediately and seek medical attention    New is vit D weekly for 6 months.  Iron remains too low, please take medication  Weight loss goal of 3 pounds per month   Excelent cholesterol ,   HBA1C chem 7 and eGFR in 3 month, non fasting

## 2013-01-01 NOTE — Progress Notes (Signed)
  Subjective:    Patient ID: [name], female    DOB: 23-Jul-1975, 37 y.o.   MRN: [id number]  HPI The PT is here for follow up and re-evaluation of chronic medical conditions, medication management and review of any available recent lab and radiology data.  Preventive health is updated, specifically  Cancer screening and Immunization.   Questions or concerns regarding consultations or procedures which the PT has had in the interim are  Addressed.Recently saw weight management Doc, and change is being made due to lack of weight loss on phentermine, getting drug holiday. States she has not been as diligent as in the past also, but will certainly work on this  The PT denies any adverse reactions to current medications since the last visit.  There are no new concerns.  There are no specific complaints       Review of Systems See HPI Denies recent fever or chills. Denies sinus pressure, nasal congestion, ear pain or sore throat. Denies chest congestion, productive cough or wheezing. Denies chest pains, palpitations and leg swelling Denies abdominal pain, nausea, vomiting,diarrhea or constipation.   Denies dysuria, frequency, hesitancy or incontinence. Denies joint pain, swelling and limitation in mobility. Denies headaches, seizures, numbness, or tingling. Denies depression, anxiety or insomnia. Denies skin break down or rash.        Objective:   Physical Exam  Patient alert and oriented and in no cardiopulmonary distress.  HEENT: No facial asymmetry, EOMI, no sinus tenderness,  oropharynx pink and moist.  Neck supple no adenopathy.  Chest: Clear to auscultation bilaterally.  CVS: S1, S2 no murmurs, no S3.  ABD: Soft non tender. Bowel sounds normal.  Ext: No edema  MS: Adequate ROM spine, shoulders, hips and knees.  Skin: Intact, no ulcerations or rash noted.  Psych: Good eye contact, normal affect. Memory intact not anxious or depressed appearing.  CNS: CN 2-12  intact, power, tone and sensation normal throughout.       Assessment & Plan:

## 2013-01-02 ENCOUNTER — Encounter: Payer: Self-pay | Admitting: Family Medicine

## 2013-01-05 NOTE — Assessment & Plan Note (Signed)
Controlled, no change in medication  

## 2013-01-05 NOTE — Assessment & Plan Note (Signed)
Less well controlled, will attempt to switch to invokana and use with metformin and byetta only, in the hope that weight loss benefit is realized , pt to start with 100mg  tabs , samples requested

## 2013-01-05 NOTE — Assessment & Plan Note (Addendum)
Needs 6 month weekly supplement then rept lab after that time

## 2013-01-05 NOTE — Assessment & Plan Note (Signed)
Iron still low, pt to commit to daily supplement

## 2013-01-05 NOTE — Assessment & Plan Note (Signed)
Unchanged. Patient re-educated about  the importance of commitment to a  minimum of 150 minutes of exercise per week. The importance of healthy food choices with portion control discussed. Encouraged to start a food diary, count calories and to consider  joining a support group. Sample diet sheets offered. Goals set by the patient for the next several months.    

## 2013-02-06 NOTE — Progress Notes (Signed)
error 

## 2013-02-19 ENCOUNTER — Encounter: Payer: Self-pay | Admitting: Family Medicine

## 2013-02-23 ENCOUNTER — Ambulatory Visit (INDEPENDENT_AMBULATORY_CARE_PROVIDER_SITE_OTHER): Payer: 59 | Admitting: Family Medicine

## 2013-02-23 VITALS — BP 136/80 | HR 76 | Wt 312.0 lb

## 2013-02-23 DIAGNOSIS — E119 Type 2 diabetes mellitus without complications: Secondary | ICD-10-CM

## 2013-02-23 NOTE — Progress Notes (Signed)
Subjective:  Patient is here for an annual pharmacy visit for the Link to Wellness program. She has recently enrolled in the program and most recently saw Cranford Mon, [facility]. Current DM regimen includes glyburide-metformin 5-500 mg, 2 tablets twice daily and Bydureon 2 mg SQ once weekly. She is not taking an ACE-I, ASA or a statin currently.   No other changes to her health. She was briefly tried on Invokana, but developed yeast infections and stopped taking it. She had plenty of the glyburide-metformin left so she restarted taking those. Phentermine dose was decreased to 30 mg every other day at her last visit with Dr. Daphine Deutscher.   She has an appointment pending with Dr. Lodema Hong later this week on Thursday to discuss the failure of Invokana. Dr. Lodema Hong wanted her to try it because of the potential for weight loss. She has an appointment pending next week with Dr. Daphine Deutscher for weight loss.   She is taking NuIron 150 mg for chronic anemia but states that she usually only gets one dose in during the week.    Disease Assessments:  Diabetes:  Sees Diabetes provider 2 times per year; MD managing Diabetes Dr. Syliva Overman; checks blood glucose less than 1 time a week; checks feet daily; takes medications as prescribed; does not use glucometer; does not take an aspirin a day;   Highest CBG 130; Lowest CBG 50;  Other Diabetes History: Patient did not bring BG meter with her to the appointment. She states that she is not checking her blood glucose very often- maybe a few times a month. She reports once episode of hypoglycemia- it got down to the 50s. She states it went low because she didn't eat. Highs and lows are patient reported.   At her last visit with Dr. Lodema Hong she was given samples of Invokana. She states that she took it for a week and developed a yeast infection. She stopped taking the Invokana because she did not want to deal with the constant yeast infection. We reviewed good hygiene technique  and patient is not willing to restart Invokana. Patient did return to taking glyburide-metformin twice daily.      Social History:  Alcohol use: 1 - 2 drinks per year; Exercise adherence Never; 0 minutes of exercise per week;  Diet adherence 25-50% of the time;   Medication adherence adherent Patient is adherent to taking diabetes medications. She states that she is only taking Iron capsules once a week (should be taking daily).; Pharmacist consult was done; Patient knows the purpose/use of medications; Patient can afford medications.  Occupation: Charity fundraiser at [employer]   Physical Activity-  Not much. She does a lot of things with her kids. She has two kids who are 13 and 9. She admits that she has never been in the habit of regular exercise. She states that the perceived benefits of exercise would be to come off of some of her diabetes medicine, to lose weight. She thinks that she would feel better. Her biggest obstacles to exercising are working. 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 If she is not here then she is doing something with her kids. She works Monday through Friday and she is working long hours. She sometimes does the wii with her two kids. She does the zumba game and also has another dance game that she does.   Nutrition-  She states that things have been going OK. She states she has been  cutting back on portion sizes. She states that her biggest struggle is sweets and chips. She states that she is eating only eating sweets on the weekends and then eating chips maybe three times a week.   She eats at the cafeteria at least four days a week. When she eats there she tries to avoid fried foods. She eats the soup or something from the entree side. She sometimes brings leftovers from home. Her goal is 1200-1400 calories a day. She is not consistent with tracking her food. She does read food labels. She is not currently tracking her  food intake.     Hemoglobin A1c: 12/31/2012 Via Dr. Anthony Sar office 6.1    Dilated Eye Exam: 11/14/2012  Flu vaccine: 12/29/2012  Other Preventive Care Notes: Last dental Visit- August 2014     Assessment/Plan: Patient is a 37 year old female with DM2. Most recent A1C is 6.1% and is meeting goal of less than 7%. Patient is not checking her blood glucose on a regular basis.   Patient's largest barrier to overcome right now is her weight. She has lost about 2 pounds since her last appointment with Marylu Lund. She is currently working with Dr. Daphine Deutscher to achieve weight loss and is taking phentermine every other day. I discussed with patient weight loss strategies including tracking her food intake. She admits that she has a hard time with chips and sweet foods and estimates that she eats these things a few times a week. She is also eating at the cafeteria most days of the week that she is working. I talked to patient about tracking her food intake with My Fitness Pal and patient agreed to download the app and try using it.   I discussed with patient increasing physical activity in an attempt to lose weight. Patient's biggest barrier with that is her increased workload and schedule. She is working five days a week, sometimes for 10-12 hours and is tired when she gets home from work. Patient sees the benefit of regular exercise but she states that it is challenging to start physical activity once she is home after work because she is so tired. I explained to patient that she could potentially see an increase in energy level with exercise and patient identified that exercise would help her to lose weight and potentially get her off some of her diabetes medications. Patient agreed to play the wii zumba game twice a week with her kids.   Discussed with patient the new wellness program and incentives for employees. I showed her how to login to the program and how to claim her rewards. She does not meet the  healthy weight badge because of her BMI and I discussed ways with her to meet this badge including meeting with an RD and signing up for weight watchers. I challenged her to think about how to complete this badge so she could earn the additional $50.   Patient will follow up with Marylu Lund, [facility] in 3 months time..    Goals for Next Visit-  1. Commit to twice a week playing the wii with or without your kids. Ideas- zumba game, dance game. Aim for Tuesday and Friday. On the weekends try to add extra time to walk or to play the wii. 2. Start checking your blood sugar at least once a week.  3. Download the my fitness pal to your phone and start using it to track your food intake.  4. Log on to the new wellness website- [email or web address]  and complete your wellness profile, claim your tobacco free badge & complete your annual physical requirement. Think about ways to accomplish the healthy weight badge- either sign up for weight watchers or meet with a dietiatian. Marylu Lund can give you a referral to see one of the dietitians.   Next appointment to see Marylu Lund is Monday February 16th at 4 PM.   Kayla's next appointment to see Marylu Lund is Monday November 17th at 3:15 PM.   Ellender Hose. Vivia Ewing, PharmD, BCPS

## 2013-02-26 ENCOUNTER — Other Ambulatory Visit: Payer: Self-pay

## 2013-02-26 ENCOUNTER — Encounter (INDEPENDENT_AMBULATORY_CARE_PROVIDER_SITE_OTHER): Payer: Commercial Managed Care - PPO | Admitting: Surgery

## 2013-02-27 ENCOUNTER — Ambulatory Visit (INDEPENDENT_AMBULATORY_CARE_PROVIDER_SITE_OTHER): Payer: Commercial Managed Care - PPO | Admitting: Surgery

## 2013-03-02 ENCOUNTER — Ambulatory Visit (INDEPENDENT_AMBULATORY_CARE_PROVIDER_SITE_OTHER): Payer: 59 | Admitting: Family Medicine

## 2013-03-02 ENCOUNTER — Encounter: Payer: Self-pay | Admitting: Family Medicine

## 2013-03-02 VITALS — BP 106/68 | HR 88 | Resp 18 | Ht 62.0 in | Wt 313.0 lb

## 2013-03-02 DIAGNOSIS — E559 Vitamin D deficiency, unspecified: Secondary | ICD-10-CM

## 2013-03-02 DIAGNOSIS — J301 Allergic rhinitis due to pollen: Secondary | ICD-10-CM

## 2013-03-02 DIAGNOSIS — E119 Type 2 diabetes mellitus without complications: Secondary | ICD-10-CM

## 2013-03-02 DIAGNOSIS — K219 Gastro-esophageal reflux disease without esophagitis: Secondary | ICD-10-CM

## 2013-03-02 DIAGNOSIS — R5381 Other malaise: Secondary | ICD-10-CM

## 2013-03-02 DIAGNOSIS — D509 Iron deficiency anemia, unspecified: Secondary | ICD-10-CM

## 2013-03-02 MED ORDER — CANAGLIFLOZIN 300 MG PO TABS: 1.0000 | ORAL_TABLET | Freq: Every day | ORAL | Status: DC

## 2013-03-02 MED ORDER — FLUCONAZOLE 150 MG PO TABS: ORAL_TABLET | ORAL | Status: AC

## 2013-03-02 NOTE — Progress Notes (Signed)
  Subjective:    Patient ID: [name], female    DOB: 05-28-1975, 37 y.o.   MRN: [id number]  HPI The PT is here for follow up and re-evaluation of chronic medical conditions, medication management and review of any available recent lab and radiology data.  Preventive health is updated, specifically  Cancer screening and Immunization.   Questions or concerns regarding consultations or procedures which the PT has had in the interim are  addressed. The PT was intolerant of invokana due to yeast vaginitis, but wants to try it again with more diligence as far as sugar intake is concerned There are no new concerns. Does note increased allergy symptoms of upper airway congestion and drainage which is normal at this time of year for her There are no specific complaints , denies polyuria, polydipsia or blurred vision, no hypoglycemic episodes, fasting blood sugar averaging between 110 to 120      Review of Systems    See HPI Denies recent fever or chills. Denies sinus pressure, ear pain or sore throat. Denies chest congestion, productive cough or wheezing. Denies chest pains, palpitations and leg swelling Denies abdominal pain, nausea, vomiting,diarrhea or constipation.   Denies dysuria, frequency, hesitancy or incontinence. Denies joint pain, swelling and limitation in mobility. Denies headaches, seizures, numbness, or tingling. Denies depression, anxiety or insomnia. Denies skin break down or rash.     Objective:   Physical Exam  Patient alert and oriented and in no cardiopulmonary distress.  HEENT: No facial asymmetry, EOMI, no sinus tenderness,  oropharynx pink and moist.  Neck supple no adenopathy.  Chest: Clear to auscultation bilaterally.  CVS: S1, S2 no murmurs, no S3.  ABD: Soft non tender. Bowel sounds normal.  Ext: No edema  MS: Adequate ROM spine, shoulders, hips and knees.  Skin: Intact, no ulcerations or rash noted.  Psych: Good eye contact, normal  affect. Memory intact not anxious or depressed appearing.  CNS: CN 2-12 intact, power, tone and sensation normal throughout.       Assessment & Plan:

## 2013-03-02 NOTE — Patient Instructions (Signed)
F/u in 2 month, call if you need me before  New for diabetes metformin, invokana 300mg  and the exenatide  It is important that you exercise regularly at least 30 minutes 5 times a week. If you develop chest pain, have severe difficulty breathing, or feel very tired, stop exercising immediately and seek medical attention    Weight loss goal of 5 to 8 pounds  HBA1c , chem 7 and EGFR, Vit D, iron and cbc non fasting in 2015

## 2013-03-06 ENCOUNTER — Ambulatory Visit (INDEPENDENT_AMBULATORY_CARE_PROVIDER_SITE_OTHER): Payer: Commercial Managed Care - PPO | Admitting: Surgery

## 2013-03-11 ENCOUNTER — Encounter (INDEPENDENT_AMBULATORY_CARE_PROVIDER_SITE_OTHER): Payer: Self-pay | Admitting: Surgery

## 2013-03-15 NOTE — Assessment & Plan Note (Signed)
Unchanged. Patient re-educated about  the importance of commitment to a  minimum of 150 minutes of exercise per week. The importance of healthy food choices with portion control discussed. Encouraged to start a food diary, count calories and to consider  joining a support group. Sample diet sheets offered. Goals set by the patient for the next several months.    

## 2013-03-15 NOTE — Assessment & Plan Note (Signed)
Increased symptoms with change in weather, will commit t daily flonase for next several month

## 2013-03-15 NOTE — Assessment & Plan Note (Signed)
Controlled, no change in medication  

## 2013-03-15 NOTE — Assessment & Plan Note (Signed)
Needs 6 monht therapy at  least

## 2013-03-15 NOTE — Assessment & Plan Note (Signed)
Adequate control, pt did not tolerate invokana, had recurrent vaginitis, wants to try the mediation again Patient advised to reduce carb and sweets, commit to regular physical activity, take meds as prescribed, test blood as directed, and attempt to lose weight, to improve blood sugar control.

## 2013-05-04 LAB — COMPLETE METABOLIC PANEL WITH GFR
ALT: <8 U/L (ref 0–35)
AST: 10 U/L (ref 0–37)
Albumin: 3.8 g/dL (ref 3.5–5.2)
Alkaline Phosphatase: 53 U/L (ref 39–117)
BUN: 9 mg/dL (ref 6–23)
CO2: 26 mEq/L (ref 19–32)
Calcium: 8.8 mg/dL (ref 8.4–10.5)
Chloride: 104 mEq/L (ref 96–112)
Creat: 0.63 mg/dL (ref 0.50–1.10)
GFR, Est African American: >89 mL/min
GFR, Est Non African American: >89 mL/min
Glucose, Bld: 120 mg/dL — ABNORMAL HIGH (ref 70–99)
Potassium: 4.2 mEq/L (ref 3.5–5.3)
Sodium: 139 mEq/L (ref 135–145)
Total Bilirubin: 0.2 mg/dL — ABNORMAL LOW (ref 0.3–1.2)
Total Protein: 6.2 g/dL (ref 6.0–8.3)

## 2013-05-04 LAB — CBC
HCT: 36.9 % (ref 36.0–46.0)
Hemoglobin: 11.9 g/dL — ABNORMAL LOW (ref 12.0–15.0)
MCH: 26.9 pg (ref 26.0–34.0)
MCHC: 32.2 g/dL (ref 30.0–36.0)
MCV: 83.3 fL (ref 78.0–100.0)
Platelets: 309 10*3/uL (ref 150–400)
RBC: 4.43 MIL/uL (ref 3.87–5.11)
RDW: 14.5 % (ref 11.5–15.5)
WBC: 6.5 10*3/uL (ref 4.0–10.5)

## 2013-05-04 LAB — HEMOGLOBIN A1C
Hgb A1c MFr Bld: 6.4 % — ABNORMAL HIGH (ref <5.7)
Mean Plasma Glucose: 137 mg/dL — ABNORMAL HIGH (ref <117)

## 2013-05-04 LAB — IRON: Iron: 32 ug/dL — ABNORMAL LOW (ref 42–145)

## 2013-05-05 ENCOUNTER — Ambulatory Visit: Payer: 59 | Admitting: Family Medicine

## 2013-05-05 LAB — VITAMIN D 25 HYDROXY (VIT D DEFICIENCY, FRACTURES): Vit D, 25-Hydroxy: 19 ng/mL — ABNORMAL LOW (ref 30–89)

## 2013-05-06 ENCOUNTER — Ambulatory Visit (INDEPENDENT_AMBULATORY_CARE_PROVIDER_SITE_OTHER): Payer: Commercial Managed Care - PPO | Admitting: Surgery

## 2013-05-06 ENCOUNTER — Telehealth (INDEPENDENT_AMBULATORY_CARE_PROVIDER_SITE_OTHER): Payer: Self-pay

## 2013-05-06 NOTE — Telephone Encounter (Signed)
called and spoke to patient to reschedule due to delay in surgery for Dr. Hassell Done today.  Patient's appt has been /s to 05/14/13 @ 12:20 pm w/Dr. Hassell Done

## 2013-05-07 ENCOUNTER — Ambulatory Visit (INDEPENDENT_AMBULATORY_CARE_PROVIDER_SITE_OTHER): Payer: 59 | Admitting: Family Medicine

## 2013-05-07 ENCOUNTER — Encounter: Payer: Self-pay | Admitting: Family Medicine

## 2013-05-07 VITALS — BP 126/68 | HR 100 | Resp 18 | Ht 62.0 in | Wt 300.0 lb

## 2013-05-07 DIAGNOSIS — J301 Allergic rhinitis due to pollen: Secondary | ICD-10-CM

## 2013-05-07 DIAGNOSIS — D509 Iron deficiency anemia, unspecified: Secondary | ICD-10-CM

## 2013-05-07 DIAGNOSIS — K219 Gastro-esophageal reflux disease without esophagitis: Secondary | ICD-10-CM

## 2013-05-07 DIAGNOSIS — E119 Type 2 diabetes mellitus without complications: Secondary | ICD-10-CM

## 2013-05-07 DIAGNOSIS — E559 Vitamin D deficiency, unspecified: Secondary | ICD-10-CM

## 2013-05-07 MED ORDER — ERGOCALCIFEROL 1.25 MG (50000 UT) PO CAPS: 50000.0000 [IU] | ORAL_CAPSULE | ORAL | Status: DC

## 2013-05-07 NOTE — Assessment & Plan Note (Signed)
Improving , pt to continue supplemental iron as before

## 2013-05-07 NOTE — Progress Notes (Signed)
   Subjective:    Patient ID: [name], female    DOB: Dec 01, 1975, 38 y.o.   MRN: [id number]  HPI The PT is here for follow up and re-evaluation of chronic medical conditions, medication management and review of any available recent lab and radiology data.  Preventive health is updated, specifically  Cancer screening and Immunization.  Has upcoming appt with bariatric surgery, has been off phentermine, and has done very well with invokana  . The PT denies any adverse reactions to current medications since the last visit.  There are no new concerns. Plan is to lose 2 pounds per week, and will re commit as far as diet and exercise are concerned, espescialy now that the holidays are over There are no specific complaints       Review of Systems See HPI Denies recent fever or chills. Denies sinus pressure, nasal congestion, ear pain or sore throat. Denies chest congestion, productive cough or wheezing. Denies chest pains, palpitations and leg swelling Denies abdominal pain, nausea, vomiting,diarrhea or constipation.   Denies dysuria, frequency, hesitancy or incontinence. Denies joint pain, swelling and limitation in mobility. Denies headaches, seizures, numbness, or tingling. Denies depression, anxiety or insomnia. Denies skin break down or rash.        Objective:   Physical Exam  Patient alert and oriented and in no cardiopulmonary distress.  HEENT: No facial asymmetry, EOMI, no sinus tenderness,  oropharynx pink and moist.  Neck supple no adenopathy.  Chest: Clear to auscultation bilaterally.  CVS: S1, S2 no murmurs, no S3.  ABD: Soft non tender. Bowel sounds normal.  Ext: No edema  MS: Adequate ROM spine, shoulders, hips and knees.  Skin: Intact, no ulcerations or rash noted.  Psych: Good eye contact, normal affect. Memory intact not anxious or depressed appearing.  CNS: CN 2-12 intact, power, tone and sensation normal throughout.       Assessment &  Plan:

## 2013-05-07 NOTE — Assessment & Plan Note (Signed)
Controlled, no change in medication  

## 2013-05-07 NOTE — Assessment & Plan Note (Signed)
Slight improvement, continue supplements for an additional 6 month

## 2013-05-07 NOTE — Assessment & Plan Note (Addendum)
Controlled, no change in medication Patient advised to reduce carb and sweets, commit to regular physical activity, take meds as prescribed, test blood as directed, and attempt to lose weight, to improve blood sugar control.  

## 2013-05-07 NOTE — Assessment & Plan Note (Signed)
No current flare, medication as needed

## 2013-05-07 NOTE — Assessment & Plan Note (Signed)
Improved. Pt applauded on succesful weight loss through lifestyle change, and encouraged to continue same. Weight loss goal set for the next several months.  

## 2013-05-07 NOTE — Patient Instructions (Signed)
F/u in 4 month    HBA1C , chem 7 and EGFR, CBC , iron and ferritin in 4 month, non fasting   Continue weekly vit D and iron as beforre   Medication for diabetes stays the same also  CONGRATS on weight loss, current goal is 6 to 8 pounds per month

## 2013-05-14 ENCOUNTER — Encounter (INDEPENDENT_AMBULATORY_CARE_PROVIDER_SITE_OTHER): Payer: Self-pay | Admitting: Surgery

## 2013-05-14 ENCOUNTER — Ambulatory Visit (INDEPENDENT_AMBULATORY_CARE_PROVIDER_SITE_OTHER): Payer: Commercial Managed Care - PPO | Admitting: Surgery

## 2013-05-14 DIAGNOSIS — Z6841 Body Mass Index (BMI) 40.0 and over, adult: Secondary | ICD-10-CM

## 2013-05-14 MED ORDER — PHENTERMINE HCL 37.5 MG PO CAPS: 37.5000 mg | ORAL_CAPSULE | ORAL | Status: DC

## 2013-05-14 NOTE — Progress Notes (Signed)
[name] 38 y.o.  Body mass index is 54.38 kg/(m^2).  Patient Active Problem List   Diagnosis Date Noted  . Vitamin B 12 deficiency 11/22/2011  . Vitamin D deficiency 11/22/2011  . Rotator cuff syndrome of right shoulder 08/23/2011  . Knee pain, left 06/04/2011  . B12 deficiency anemia 06/04/2011  . Heel pain 11/06/2010  . FATIGUE 11/28/2009  . PLANTAR FASCIITIS, TRAUMATIC 03/15/2009  . VAGINITIS&VULVOVAGINITIS DISEASES CLASS ELSW 03/26/2008  . DM 09/03/2007  . Obesity, morbid 09/03/2007  . ANEMIA, IRON DEFICIENCY 09/03/2007  . ALLERGIC RHINITIS, SEASONAL 09/03/2007  . GERD 09/03/2007    No Known Allergies  Past Surgical History  Procedure Laterality Date  . Cesarean section      x 2   Jackie Nakayama, MD 1. Morbid obesity     Ms. Apt has had a two-month vacation from phentermine. She is doing well her blood pressure is 122/70. Her BMI is down to 54.8  She wanted to go back on phentermine so I prescribed 3 months worth and we'll see her back in 3 months. It seems to be working helping her gradually get her weight down. She's had 294.4 a day and her BMI is down to 54.8. I will see her back in 3 months to [place]. Hassell Done, MD, [facility] [phone] beeper [phone]  05/14/2013 1:22 PM

## 2013-05-26 NOTE — Progress Notes (Signed)
Patient ID: [name], female   DOB: 1975/05/18, 38 y.o.   MRN: [id number] ATTENDING PHYSICIAN NOTE: I have reviewed the chart and agree with the plan as detailed above. Dorcas Mcmurray MD Pager [phone]

## 2013-06-01 ENCOUNTER — Other Ambulatory Visit: Payer: Self-pay | Admitting: Family Medicine

## 2013-06-24 ENCOUNTER — Ambulatory Visit (INDEPENDENT_AMBULATORY_CARE_PROVIDER_SITE_OTHER): Payer: 59 | Admitting: Obstetrics & Gynecology

## 2013-06-24 ENCOUNTER — Other Ambulatory Visit (HOSPITAL_COMMUNITY)
Admission: RE | Admit: 2013-06-24 | Discharge: 2013-06-24 | Disposition: A | Discharge status: Home / Self Care | Payer: 59 | Source: Ambulatory Visit | Attending: Obstetrics & Gynecology | Admitting: Obstetrics & Gynecology

## 2013-06-24 ENCOUNTER — Encounter: Payer: Self-pay | Admitting: Obstetrics & Gynecology

## 2013-06-24 VITALS — BP 90/60 | Ht 62.0 in | Wt 292.0 lb

## 2013-06-24 DIAGNOSIS — Z01419 Encounter for gynecological examination (general) (routine) without abnormal findings: Secondary | ICD-10-CM | POA: Insufficient documentation

## 2013-06-24 DIAGNOSIS — Z1151 Encounter for screening for human papillomavirus (HPV): Secondary | ICD-10-CM | POA: Insufficient documentation

## 2013-06-24 NOTE — Addendum Note (Signed)
Addended by: Linton Rump on: 06/24/2013 03:53 PM   Modules accepted: Orders

## 2013-06-24 NOTE — Progress Notes (Signed)
Patient ID: [name], female   DOB: 1975/12/24, 38 y.o.   MRN: [id number] Subjective:     [name] is a 38 y.o. female here for a routine exam.  No LMP recorded. Patient is not currently having periods (Reason: IUD). No obstetric history on file. Birth Control Method:  IUD  Menstrual Calendar(currently): rare  Current complaints: none.   Current acute medical issues:  Type II DM   Recent Gynecologic History No LMP recorded. Patient is not currently having periods (Reason: IUD). Last Pap: 2014,  normal Last mammogram: 2011,  normal  Past Medical History  Diagnosis Date  . Diabetes mellitus   . Allergy   . GERD (gastroesophageal reflux disease)   . Anemia     Past Surgical History  Procedure Laterality Date  . Cesarean section      x 2    OB History   Grav Para Term Preterm Abortions TAB SAB Ect Mult Living                  History   Social History  . Marital Status: Married    Spouse Name: N/A    Number of Children: N/A  . Years of Education: N/A   Social History Main Topics  . Smoking status: Never Smoker   . Smokeless tobacco: None  . Alcohol Use: Yes  . Drug Use: No  . Sexual Activity: None   Other Topics Concern  . None   Social History Narrative  . None    Family History  Problem Relation Age of Onset  . Hypertension Mother   . COPD Father   . Diabetes Father   . Heart disease Father   . Hypertension Father   . Kidney disease Father   . Stroke Father      Review of Systems  Review of Systems  Constitutional: Negative for fever, chills, weight loss, malaise/fatigue and diaphoresis.  HENT: Negative for hearing loss, ear pain, nosebleeds, congestion, sore throat, neck pain, tinnitus and ear discharge.   Eyes: Negative for blurred vision, double vision, photophobia, pain, discharge and redness.  Respiratory: Negative for cough, hemoptysis, sputum production, shortness of breath, wheezing and stridor.   Cardiovascular: Negative  for chest pain, palpitations, orthopnea, claudication, leg swelling and PND.  Gastrointestinal: negative for abdominal pain. Negative for heartburn, nausea, vomiting, diarrhea, constipation, blood in stool and melena.  Genitourinary: Negative for dysuria, urgency, frequency, hematuria and flank pain.  Musculoskeletal: Negative for myalgias, back pain, joint pain and falls.  Skin: Negative for itching and rash.  Neurological: Negative for dizziness, tingling, tremors, sensory change, speech change, focal weakness, seizures, loss of consciousness, weakness and headaches.  Endo/Heme/Allergies: Negative for environmental allergies and polydipsia. Does not bruise/bleed easily.  Psychiatric/Behavioral: Negative for depression, suicidal ideas, hallucinations, memory loss and substance abuse. The patient is not nervous/anxious and does not have insomnia.        Objective:    Physical Exam  Vitals reviewed. Constitutional: She is oriented to person, place, and time. She appears well-developed and well-nourished.  HENT:  Head: Normocephalic and atraumatic.        Right Ear: External ear normal.  Left Ear: External ear normal.  Nose: Nose normal.  Mouth/Throat: Oropharynx is clear and moist.  Eyes: Conjunctivae and EOM are normal. Pupils are equal, round, and reactive to light. Right eye exhibits no discharge. Left eye exhibits no discharge. No scleral icterus.  Neck: Normal range of motion. Neck supple. No tracheal deviation present. No  thyromegaly present.  Cardiovascular: Normal rate, regular rhythm, normal heart sounds and intact distal pulses.  Exam reveals no gallop and no friction rub.   No murmur heard. Respiratory: Effort normal and breath sounds normal. No respiratory distress. She has no wheezes. She has no rales. She exhibits no tenderness.  GI: Soft. Bowel sounds are normal. She exhibits no distension and no mass. There is no tenderness. There is no rebound and no guarding.   Genitourinary:  Breasts no masses skin changes or nipple changes bilaterally      Vulva is normal without lesions Vagina is pink moist without discharge, could not see IUD string Cervix normal in appearance and pap is done Uterus is normal size shape and contour Adnexa is negative with normal sized ovaries   Musculoskeletal: Normal range of motion. She exhibits no edema and no tenderness.  Neurological: She is alert and oriented to person, place, and time. She has normal reflexes. She displays normal reflexes. No cranial nerve deficit. She exhibits normal muscle tone. Coordination normal.  Skin: Skin is warm and dry. No rash noted. No erythema. No pallor.  Psychiatric: She has a normal mood and affect. Her behavior is normal. Judgment and thought content normal.       Assessment:    Healthy female exam.    Plan:    Contraception: IUD. Follow up in: 1 year. Due for removal and reinsertion next week

## 2013-07-01 ENCOUNTER — Ambulatory Visit (INDEPENDENT_AMBULATORY_CARE_PROVIDER_SITE_OTHER): Payer: 59 | Admitting: Obstetrics and Gynecology

## 2013-07-01 ENCOUNTER — Encounter: Payer: Self-pay | Admitting: Obstetrics and Gynecology

## 2013-07-01 VITALS — BP 100/64 | Ht 62.0 in | Wt 293.3 lb

## 2013-07-01 DIAGNOSIS — Z30433 Encounter for removal and reinsertion of intrauterine contraceptive device: Secondary | ICD-10-CM | POA: Insufficient documentation

## 2013-07-01 DIAGNOSIS — Z3200 Encounter for pregnancy test, result unknown: Secondary | ICD-10-CM

## 2013-07-01 DIAGNOSIS — Z3202 Encounter for pregnancy test, result negative: Secondary | ICD-10-CM

## 2013-07-01 LAB — POCT URINE PREGNANCY: Preg Test, Ur: NEGATIVE

## 2013-07-01 NOTE — Patient Instructions (Signed)
Intrauterine Device Insertion, Care After  Refer to this sheet in the next few weeks. These instructions provide you with information on caring for yourself after your procedure. Your health care provider may also give you more specific instructions. Your treatment has been planned according to current medical practices, but problems sometimes occur. Call your health care provider if you have any problems or questions after your procedure.  WHAT TO EXPECT AFTER THE PROCEDURE  Insertion of the IUD may cause some discomfort, such as cramping. The cramping should improve after the IUD is in place. You may have bleeding after the procedure. This is normal. It varies from light spotting for a few days to menstrual-like bleeding. When the IUD is in place, a string will extend past the cervix into the vagina for 1 2 inches. The strings should not bother you or your partner. If they do, talk to your health care provider.   HOME CARE INSTRUCTIONS    Check your intrauterine device (IUD) to make sure it is in place before you resume sexual activity. You should be able to feel the strings. If you cannot feel the strings, something may be wrong. The IUD may have fallen out of the uterus, or the uterus may have been punctured (perforated) during placement. Also, if the strings are getting longer, it may mean that the IUD is being forced out of the uterus. You no longer have full protection from pregnancy if any of these problems occur.   You may resume sexual intercourse if you are not having problems with the IUD. The copper IUD is considered immediately effective, and the hormone IUD works right away if inserted within 7 days of your period starting. You will need to use a backup method of birth control for 7 days if the IUD in inserted at any other time in your cycle.   Continue to check that the IUD is still in place by feeling for the strings after every menstrual period.   You may need to take pain medicine such as  acetaminophen or ibuprofen. Only take medicines as directed by your health care provider.  SEEK MEDICAL CARE IF:    You have bleeding that is heavier or lasts longer than a normal menstrual cycle.   You have a fever.   You have increasing cramps or abdominal pain not relieved with medicine.   You have abdominal pain that does not seem to be related to the same area of earlier cramping and pain.   You are lightheaded, unusually weak, or faint.   You have abnormal vaginal discharge or smells.   You have pain during sexual intercourse.   You cannot feel the IUD strings, or the IUD string has gotten longer.   You feel the IUD at the opening of the cervix in the vagina.   You think you are pregnant, or you miss your menstrual period.   The IUD string is hurting your sex partner.  MAKE SURE YOU:   Understand these instructions.   Will watch your condition.   Will get help right away if you are not doing well or get worse.  Document Released: 12/06/2010 Document Revised: 01/28/2013 Document Reviewed: 09/28/2012  Exit[employer] Patient Information 2014 [employer].

## 2013-07-01 NOTE — Progress Notes (Signed)
This chart was scribed by Jenne Campus, Medical Scribe, for Dr. Mallory Shirk on 07/01/13 at 9:19 AM. This chart was reviewed by Dr. Mallory Shirk and is accurate.   GYNECOLOGY CLINIC PROCEDURE NOTE  [name] is a 38 y.o. G2P2 here for Mirena IUD removal and reinsertion. No GYN concerns. States that she had 3 to 4 menses while on the IUD before not having any menses.  Last pap smear was on 06/24/13 and was normal.  IUD Removal and Reinsertion  Patient identified, informed consent performed. Discussed risks of irregular bleeding, cramping, infection, malpositioning or misplacement of the IUD outside the uterus which may require further procedures. Also advised to use backup contraception for one week as the risk of pregnancy is higher during the transition period of removing an IUD and replacing it with another one. Time out was performed. Speculum placed in the vagina. The strings of the IUD were not visible using speculum. Visualized using U/S. Probe inserted to pull IUD forward. IUD grasped and pulled using ring forceps. The IUD was successfully removed in its entirety. The cervix was cleaned with Betadine x 2 and grasped anteriorly with a single tooth tenaculum.  The new Mirena IUD insertion apparatus was used to sound the uterus to 8 cm;  the IUD was then placed per manufacturer's recommendations. Strings trimmed to 3 cm. Tenaculum was removed, good hemostasis noted. Patient tolerated procedure well.   Chaperone was present for procedure which was performed with pt's consent.   Patient was given post-procedure instructions.  She was reminded to have backup contraception for one week during this transition period between IUDs.  Patient was also asked to check IUD strings periodically and follow up in 4 weeks for IUD check.   [name] 07/01/2013 9:24 AM

## 2013-07-09 ENCOUNTER — Other Ambulatory Visit: Payer: Self-pay | Admitting: Family Medicine

## 2013-07-16 ENCOUNTER — Ambulatory Visit (INDEPENDENT_AMBULATORY_CARE_PROVIDER_SITE_OTHER): Payer: 59 | Admitting: Obstetrics and Gynecology

## 2013-07-16 ENCOUNTER — Encounter: Payer: Self-pay | Admitting: Obstetrics and Gynecology

## 2013-07-16 VITALS — BP 116/74 | Ht 62.0 in | Wt 287.4 lb

## 2013-07-16 DIAGNOSIS — Z30431 Encounter for routine checking of intrauterine contraceptive device: Secondary | ICD-10-CM | POA: Insufficient documentation

## 2013-07-16 NOTE — Progress Notes (Signed)
This chart was scribed by Ludger Nutting, Medical Scribe, for Dr. Mallory Shirk on 07/16/13 at 9:26 AM. This chart was reviewed by Dr. Mallory Shirk and is accurate.    GYNECOLOGY CLINIC PROGRESS NOTE  History:  38 y.o. G2P2 here today for today for IUD string check; Mirena IUD was placed  2 weeks ago. No complaints about the Mirena, no concerning side effects.  The following portions of the patient's history were reviewed and updated as appropriate: allergies, current medications, past family history, past medical history, past social history, past surgical history and problem list. Last pap smear on 06/24/13 was normal, negative HRHPV.  Review of Systems:  Pertinent items are noted in HPI.  Objective:  Physical Exam Blood pressure 116/74, height 5\' 2"  (1.575 m), weight 287 lb 6.4 oz (130.364 kg). Gen: NAD Abd: Soft, nontender and nondistended Pelvic: Normal appearing external genitalia; normal appearing vaginal mucosa and cervix. IUD strings visualized, about 3 cm in length outside cervix.  Used ultrasound to confirm placement of IUD. Both prongs visible in proper position  Assessment & Plan:  Normal IUD check. Patient to keep IUD in place for five years; can come in for removal if she desires pregnancy within the next five years. Routine preventative health maintenance measures (such as mammograms) emphasized.

## 2013-09-02 ENCOUNTER — Ambulatory Visit (INDEPENDENT_AMBULATORY_CARE_PROVIDER_SITE_OTHER): Payer: Commercial Managed Care - PPO | Admitting: Surgery

## 2013-09-02 ENCOUNTER — Encounter (INDEPENDENT_AMBULATORY_CARE_PROVIDER_SITE_OTHER): Payer: Self-pay | Admitting: Surgery

## 2013-09-02 MED ORDER — PHENTERMINE HCL 37.5 MG PO CAPS
37.5000 mg | ORAL_CAPSULE | ORAL | Status: DC
Start: 2013-09-02 — End: 2014-07-05

## 2013-09-02 NOTE — Patient Instructions (Signed)
Phentermine orally disintegrating tablets (ODT) What is this medicine? PHENTERMINE (FEN ter meen) decreases your appetite. It is used with a reduced calorie diet and exercise to help you lose weight. This medicine may be used for other purposes; ask your health care provider or pharmacist if you have questions. COMMON BRAND NAME(S): Suprenza What should I tell my health care provider before I take this medicine? They need to know if you have any of these conditions: -agitation -glaucoma -heart disease -high blood pressure -history of substance abuse -lung disease called Primary Pulmonary Hypertension (PPH) -taken an MAOI like Carbex, Eldepryl, Marplan, Nardil, or Parnate in last 14 days -thyroid disease -an unusual or allergic reaction to phentermine, other medicines, foods, dyes, or preservatives -pregnant or trying to get pregnant -breast-feeding How should I use this medicine? Take this medicine by mouth. Follow the directions on the prescription label. The tablets should stay in the bottle until immediately before you take your dose. Use dry hands to remove the dose from the bottle. Do not cut or split the tablets in half. Place the tablet in the mouth and allow it to dissolve, and then swallow. While you may take these tablets with water, it is not necessary to do so. Take your doses at regular intervals. Do not take your medicine more often than directed. Do not stop taking except on the advice of your doctor or health care professional. Talk to your pediatrician regarding the use of this medicine in children. Special care may be needed. Overdosage: If you think you've taken too much of this medicine contact a poison control center or emergency room at once. Overdosage: If you think you have taken too much of this medicine contact a poison control center or emergency room at once. NOTE: This medicine is only for you. Do not share this medicine with others. What if I miss a dose? If you  miss a dose, take it as soon as you can. If it is almost time for your next dose, take only that dose. Do not take double or extra doses. What may interact with this medicine? Do not take this medicine with any of the following medications: -duloxetine -MAOIs like Carbex, Eldepryl, Marplan, Nardil, and Parnate -medicines for colds or breathing difficulties like pseudoephedrine or phenylephrine -procarbazine -sibutramine -SSRIs like citalopram, escitalopram, fluoxetine, fluvoxamine, paroxetine, and sertraline -stimulants like dexmethylphenidate, methylphenidate or modafinil -venlafaxine  This medicine may also interact with the following medications: -medicines for diabetes This list may not describe all possible interactions. Give your health care provider a list of all the medicines, herbs, non-prescription drugs, or dietary supplements you use. Also tell them if you smoke, drink alcohol, or use illegal drugs. Some items may interact with your medicine. What should I watch for while using this medicine? Notify your physician immediately if you become short of breath while doing your normal activities. Do not take this medicine within 6 hours of bedtime. It can keep you from getting to sleep. Avoid drinks that contain caffeine and try to stick to a regular bedtime every night. This medicine is intended to be used in addition to a healthy diet and exercise. The best results are achieved this way. This medicine is only indicated for short-term use. Eventually your weight loss may level out. At that point, the drug will only help you maintain your new weight. Do not increase or in any way change your dose without consulting your doctor. You may get drowsy or dizzy. Do not drive, use machinery,  or do anything that needs mental alertness until you know how this medicine affects you. Do not stand or sit up quickly, especially if you are an older patient. This reduces the risk of dizzy or fainting spells.  Alcohol may increase dizziness and drowsiness. Avoid alcoholic drinks. What side effects may I notice from receiving this medicine? Side effects that you should report to your doctor or health care professional as soon as possible: -chest pain, palpitations -depression or severe changes in mood -increased blood pressure -irritability -nervousness or restlessness -severe dizziness -shortness of breath -problems urinating -unusual swelling of the legs -vomiting  Side effects that usually do not require medical attention (report to your doctor or health care professional if they continue or are bothersome): -blurred vision or other eye problems -changes in sexual ability or desire -constipation or diarrhea -difficulty sleeping -dry mouth or unpleasant taste -headache -nausea This list may not describe all possible side effects. Call your doctor for medical advice about side effects. You may report side effects to FDA at 1-800-FDA-1088. Where should I keep my medicine? Keep out of the reach of children. This medicine can be abused. Keep your medicine in a safe place to protect it from theft. Do not share this medicine with anyone. Selling or giving away this medicine is dangerous and against the law. Store at room temperature between 20 and 25 degrees C (68 and 77 degrees F). Keep container tightly closed. Throw away any unused medicine after the expiration date. NOTE: This sheet is a summary. It may not cover all possible information. If you have questions about this medicine, talk to your doctor, pharmacist, or health care provider.  2014, [employer] Standard. (2010-05-24 10:40:21)

## 2013-09-02 NOTE — Progress Notes (Signed)
[name] 38 y.o.  Body mass index is 52.85 kg/(m^2).  Patient Active Problem List   Diagnosis Date Noted  . IUD check up 07/16/2013  . Encounter for IUD removal and reinsertion 07/01/2013  . Vitamin B 12 deficiency 11/22/2011  . Vitamin D deficiency 11/22/2011  . Rotator cuff syndrome of right shoulder 08/23/2011  . Knee pain, left 06/04/2011  . B12 deficiency anemia 06/04/2011  . Heel pain 11/06/2010  . FATIGUE 11/28/2009  . PLANTAR FASCIITIS, TRAUMATIC 03/15/2009  . VAGINITIS&VULVOVAGINITIS DISEASES CLASS ELSW 03/26/2008  . DM 09/03/2007  . Obesity, morbid 09/03/2007  . ANEMIA, IRON DEFICIENCY 09/03/2007  . ALLERGIC RHINITIS, SEASONAL 09/03/2007  . GERD 09/03/2007    No Known Allergies  Past Surgical History  Procedure Laterality Date  . Cesarean section      x 2   Tula Nakayama, MD 1. Morbid obesity     BMI is down to 52 from 66 when first seen.  She doesn't take med every day after we did the "vacation" from med last year.  She is doing well with the Phentermine and I will refill for the next 3 months.  Will recheck in 3 months. Matt B. Hassell Done, MD, [facility] [phone] beeper [phone]  09/02/2013 9:36 AM

## 2013-09-08 LAB — COMPLETE METABOLIC PANEL WITH GFR
ALK PHOS: 56 U/L (ref 39–117)
ALT: 9 U/L (ref 0–35)
AST: 10 U/L (ref 0–37)
Albumin: 3.7 g/dL (ref 3.5–5.2)
BUN: 14 mg/dL (ref 6–23)
CO2: 25 meq/L (ref 19–32)
Calcium: 8.8 mg/dL (ref 8.4–10.5)
Chloride: 107 mEq/L (ref 96–112)
Creat: 0.73 mg/dL (ref 0.50–1.10)
GFR, Est African American: >89 mL/min
GFR, Est Non African American: >89 mL/min
Glucose, Bld: 107 mg/dL — ABNORMAL HIGH (ref 70–99)
Potassium: 4 mEq/L (ref 3.5–5.3)
SODIUM: 140 meq/L (ref 135–145)
Total Bilirubin: 0.3 mg/dL (ref 0.2–1.2)
Total Protein: 6.2 g/dL (ref 6.0–8.3)

## 2013-09-08 LAB — FERRITIN: Ferritin: 12 ng/mL (ref 10–291)

## 2013-09-08 LAB — CBC
HCT: 37.3 % (ref 36.0–46.0)
Hemoglobin: 11.8 g/dL — ABNORMAL LOW (ref 12.0–15.0)
MCH: 26.5 pg (ref 26.0–34.0)
MCHC: 31.6 g/dL (ref 30.0–36.0)
MCV: 83.8 fL (ref 78.0–100.0)
PLATELETS: 277 10*3/uL (ref 150–400)
RBC: 4.45 MIL/uL (ref 3.87–5.11)
RDW: 15 % (ref 11.5–15.5)
WBC: 6.4 10*3/uL (ref 4.0–10.5)

## 2013-09-08 LAB — IRON: Iron: 36 ug/dL — ABNORMAL LOW (ref 42–145)

## 2013-09-08 LAB — HEMOGLOBIN A1C
Hgb A1c MFr Bld: 6.3 % — ABNORMAL HIGH (ref <5.7)
Mean Plasma Glucose: 134 mg/dL — ABNORMAL HIGH (ref <117)

## 2013-09-09 ENCOUNTER — Ambulatory Visit: Payer: 59 | Admitting: Family Medicine

## 2013-09-09 ENCOUNTER — Ambulatory Visit (INDEPENDENT_AMBULATORY_CARE_PROVIDER_SITE_OTHER): Payer: 59 | Admitting: Family Medicine

## 2013-09-09 ENCOUNTER — Encounter: Payer: Self-pay | Admitting: Family Medicine

## 2013-09-09 VITALS — BP 104/72 | HR 70 | Resp 18 | Ht 62.0 in | Wt 269.0 lb

## 2013-09-09 DIAGNOSIS — E1169 Type 2 diabetes mellitus with other specified complication: Secondary | ICD-10-CM

## 2013-09-09 DIAGNOSIS — E559 Vitamin D deficiency, unspecified: Secondary | ICD-10-CM

## 2013-09-09 DIAGNOSIS — R5383 Other fatigue: Secondary | ICD-10-CM

## 2013-09-09 DIAGNOSIS — E669 Obesity, unspecified: Secondary | ICD-10-CM

## 2013-09-09 DIAGNOSIS — E119 Type 2 diabetes mellitus without complications: Secondary | ICD-10-CM

## 2013-09-09 DIAGNOSIS — G4700 Insomnia, unspecified: Secondary | ICD-10-CM | POA: Insufficient documentation

## 2013-09-09 DIAGNOSIS — D509 Iron deficiency anemia, unspecified: Secondary | ICD-10-CM

## 2013-09-09 DIAGNOSIS — R5381 Other malaise: Secondary | ICD-10-CM

## 2013-09-09 MED ORDER — TEMAZEPAM 15 MG PO CAPS: 15.0000 mg | ORAL_CAPSULE | Freq: Every evening | ORAL | Status: DC | PRN

## 2013-09-09 NOTE — Patient Instructions (Addendum)
F/u in 4   Month, call if you need me before  CONGRATS  On excellent  Weight loss, keep it up. Ensure you eat regularly    HBA1C, vit D , iron and ferrittin, TSH, chem 7 and EGFR in 4 month   New for sleep is restoril  Insomnia Insomnia is frequent trouble falling and/or staying asleep. Insomnia can be a long term problem or a short term problem. Both are common. Insomnia can be a short term problem when the wakefulness is related to a certain stress or worry. Long term insomnia is often related to ongoing stress during waking hours and/or poor sleeping habits. Overtime, sleep deprivation itself can make the problem worse. Every little thing feels more severe because you are overtired and your ability to cope is decreased. CAUSES   Stress, anxiety, and depression.  Poor sleeping habits.  Distractions such as TV in the bedroom.  Naps close to bedtime.  Engaging in emotionally charged conversations before bed.  Technical reading before sleep.  Alcohol and other sedatives. They may make the problem worse. They can hurt normal sleep patterns and normal dream activity.  Stimulants such as caffeine for several hours prior to bedtime.  Pain syndromes and shortness of breath can cause insomnia.  Exercise late at night.  Changing time zones may cause sleeping problems (jet lag). It is sometimes helpful to have someone observe your sleeping patterns. They should look for periods of not breathing during the night (sleep apnea). They should also look to see how long those periods last. If you live alone or observers are uncertain, you can also be observed at a sleep clinic where your sleep patterns will be professionally monitored. Sleep apnea requires a checkup and treatment. Give your caregivers your medical history. Give your caregivers observations your family has made about your sleep.  SYMPTOMS   Not feeling rested in the morning.  Anxiety and restlessness at bedtime.  Difficulty  falling and staying asleep. TREATMENT   Your caregiver may prescribe treatment for an underlying medical disorders. Your caregiver can give advice or help if you are using alcohol or other drugs for self-medication. Treatment of underlying problems will usually eliminate insomnia problems.  Medications can be prescribed for short time use. They are generally not recommended for lengthy use.  Over-the-counter sleep medicines are not recommended for lengthy use. They can be habit forming.  You can promote easier sleeping by making lifestyle changes such as:  Using relaxation techniques that help with breathing and reduce muscle tension.  Exercising earlier in the day.  Changing your diet and the time of your last meal. No night time snacks.  Establish a regular time to go to bed.  Counseling can help with stressful problems and worry.  Soothing music and white noise may be helpful if there are background noises you cannot remove.  Stop tedious detailed work at least one hour before bedtime. HOME CARE INSTRUCTIONS   Keep a diary. Inform your caregiver about your progress. This includes any medication side effects. See your caregiver regularly. Take note of:  Times when you are asleep.  Times when you are awake during the night.  The quality of your sleep.  How you feel the next day. This information will help your caregiver care for you.  Get out of bed if you are still awake after 15 minutes. Read or do some quiet activity. Keep the lights down. Wait until you feel sleepy and go back to bed.  Keep regular  sleeping and waking hours. Avoid naps.  Exercise regularly.  Avoid distractions at bedtime. Distractions include watching television or engaging in any intense or detailed activity like attempting to balance the household checkbook.  Develop a bedtime ritual. Keep a familiar routine of bathing, brushing your teeth, climbing into bed at the same time each night, listening  to soothing music. Routines increase the success of falling to sleep faster.  Use relaxation techniques. This can be using breathing and muscle tension release routines. It can also include visualizing peaceful scenes. You can also help control troubling or intruding thoughts by keeping your mind occupied with boring or repetitive thoughts like the old concept of counting sheep. You can make it more creative like imagining planting one beautiful flower after another in your backyard garden.  During your day, work to eliminate stress. When this is not possible use some of the previous suggestions to help reduce the anxiety that accompanies stressful situations. MAKE SURE YOU:   Understand these instructions.  Will watch your condition.  Will get help right away if you are not doing well or get worse. Document Released: 04/06/2000 Document Revised: 07/02/2011 Document Reviewed: 05/07/2007 [facility] Patient Information 2014 [place].

## 2013-09-29 ENCOUNTER — Ambulatory Visit: Payer: 59 | Admitting: Family Medicine

## 2013-10-12 ENCOUNTER — Other Ambulatory Visit: Payer: Self-pay | Admitting: Family Medicine

## 2013-10-22 ENCOUNTER — Telehealth: Payer: Self-pay | Admitting: Family Medicine

## 2013-10-22 ENCOUNTER — Other Ambulatory Visit: Payer: Self-pay | Admitting: Family Medicine

## 2013-10-22 DIAGNOSIS — E119 Type 2 diabetes mellitus without complications: Secondary | ICD-10-CM

## 2013-10-22 MED ORDER — CANAGLIFLOZIN 300 MG PO TABS: 1.0000 | ORAL_TABLET | Freq: Every day | ORAL | Status: DC

## 2013-10-22 NOTE — Telephone Encounter (Signed)
Refill sent.

## 2013-12-23 ENCOUNTER — Ambulatory Visit (INDEPENDENT_AMBULATORY_CARE_PROVIDER_SITE_OTHER): Payer: Commercial Managed Care - PPO | Admitting: Surgery

## 2014-01-04 ENCOUNTER — Telehealth: Payer: Self-pay | Admitting: Family Medicine

## 2014-01-04 DIAGNOSIS — Z13220 Encounter for screening for lipoid disorders: Secondary | ICD-10-CM

## 2014-01-04 NOTE — Telephone Encounter (Signed)
Left message to trash her current lab order for this month and I was going to mail her a new one and to go fasting and have these done

## 2014-01-29 ENCOUNTER — Other Ambulatory Visit: Payer: Self-pay | Admitting: Family Medicine

## 2014-02-15 NOTE — Assessment & Plan Note (Signed)
Improved. Pt applauded on succesful weight loss through lifestyle change, and encouraged to continue same. Weight loss goal set for the next several months.  

## 2014-02-15 NOTE — Assessment & Plan Note (Signed)
Sleep hygiene reviewed and written information offered also. Prescription sent for  medication needed.  

## 2014-02-15 NOTE — Progress Notes (Signed)
   Subjective:    Patient ID: [name], female    DOB: [birth year], 38 y.o.   MRN: [id number]  HPI The PT is here for follow up and re-evaluation of chronic medical conditions, medication management and review of any available recent lab and radiology data.  Preventive health is updated, specifically  Cancer screening and Immunization.   Questions or concerns regarding consultations or procedures which the PT has had in the interim are  addressed. The PT denies any adverse reactions to current medications since the last visit.  C/o difficulty attaining and maintaining sleep wants help for this espescialy when she works Has worked on lifestyle change with good weight loss success and improved blood sugars    Review of Systems See HPI Denies recent fever or chills. Denies sinus pressure, nasal congestion, ear pain or sore throat. Denies chest congestion, productive cough or wheezing. Denies chest pains, palpitations and leg swelling Denies abdominal pain, nausea, vomiting,diarrhea or constipation.   Denies dysuria, frequency, hesitancy or incontinence. Denies joint pain, swelling and limitation in mobility. Denies headaches, seizures, numbness, or tingling. Denies depression, anxiety . Denies skin break down or rash.        Objective:   Physical Exam BP 104/72  Pulse 70  Resp 18  Ht 5\' 2"  (1.575 m)  Wt 269 lb (122.018 kg)  BMI 49.19 kg/m2  SpO2 100% Patient alert and oriented and in no cardiopulmonary distress.  HEENT: No facial asymmetry, EOMI,   oropharynx pink and moist.  Neck supple no JVD, no mass.  Chest: Clear to auscultation bilaterally.  CVS: S1, S2 no murmurs, no S3.Regular rate.  ABD: Soft non tender.   Ext: No edema  MS: Adequate ROM spine, shoulders, hips and knees.  Skin: Intact, no ulcerations or rash noted.  Psych: Good eye contact, normal affect. Memory intact not anxious or depressed appearing.  CNS: CN 2-12 intact, power,  normal  throughout.no focal deficits noted.        Assessment & Plan:  Diabetes mellitus type 2 in obese Controlled, no change in medication Patient advised to reduce carb and sweets, commit to regular physical activity, take meds as prescribed, test blood as directed, and attempt to lose weight, to improve blood sugar control.   Iron deficiency anemia No improvement. Pt adviused of the need to be diligent in iron supplementation Updated lab needed at/ before next visit.   Obesity, morbid Improved. Pt applauded on succesful weight loss through lifestyle change, and encouraged to continue same. Weight loss goal set for the next several months.   Vitamin D deficiency Weekly supplement needed  Insomnia Sleep hygiene reviewed and written information offered also. Prescription sent for  medication needed.

## 2014-02-15 NOTE — Assessment & Plan Note (Signed)
Weekly supplement needed

## 2014-02-15 NOTE — Assessment & Plan Note (Signed)
Controlled, no change in medication Patient advised to reduce carb and sweets, commit to regular physical activity, take meds as prescribed, test blood as directed, and attempt to lose weight, to improve blood sugar control.  

## 2014-02-15 NOTE — Assessment & Plan Note (Signed)
No improvement. Pt adviused of the need to be diligent in iron supplementation Updated lab needed at/ before next visit.

## 2014-02-22 ENCOUNTER — Encounter: Payer: Self-pay | Admitting: Family Medicine

## 2014-02-22 ENCOUNTER — Ambulatory Visit: Payer: 59 | Admitting: Family Medicine

## 2014-03-08 ENCOUNTER — Other Ambulatory Visit: Payer: Self-pay | Admitting: Family Medicine

## 2014-03-08 LAB — LIPID PANEL
CHOL/HDL RATIO: 2.4 ratio
Cholesterol: 137 mg/dL (ref 0–200)
HDL: 56 mg/dL (ref >39)
LDL CALC: 74 mg/dL (ref 0–99)
Triglycerides: 37 mg/dL (ref <150)
VLDL: 7 mg/dL (ref 0–40)

## 2014-03-08 LAB — BASIC METABOLIC PANEL WITH GFR
BUN: 15 mg/dL (ref 6–23)
CALCIUM: 9.1 mg/dL (ref 8.4–10.5)
CO2: 25 mEq/L (ref 19–32)
Chloride: 107 mEq/L (ref 96–112)
Creat: 0.66 mg/dL (ref 0.50–1.10)
GFR, Est Non African American: >89 mL/min
GLUCOSE: 95 mg/dL (ref 70–99)
Potassium: 4.5 mEq/L (ref 3.5–5.3)
SODIUM: 139 meq/L (ref 135–145)

## 2014-03-08 LAB — HEMOGLOBIN A1C
Hgb A1c MFr Bld: 6 % — ABNORMAL HIGH (ref <5.7)
Mean Plasma Glucose: 126 mg/dL — ABNORMAL HIGH (ref <117)

## 2014-03-08 LAB — IRON: IRON: 27 ug/dL — AB (ref 42–145)

## 2014-03-08 LAB — FERRITIN: Ferritin: 17 ng/mL (ref 10–291)

## 2014-03-08 LAB — TSH: TSH: 2.821 u[IU]/mL (ref 0.350–4.500)

## 2014-03-09 LAB — VITAMIN D 25 HYDROXY (VIT D DEFICIENCY, FRACTURES): Vit D, 25-Hydroxy: 10 ng/mL — ABNORMAL LOW (ref 30–100)

## 2014-03-11 LAB — HM DIABETES EYE EXAM

## 2014-03-16 ENCOUNTER — Encounter: Payer: Self-pay | Admitting: Family Medicine

## 2014-03-16 ENCOUNTER — Ambulatory Visit (INDEPENDENT_AMBULATORY_CARE_PROVIDER_SITE_OTHER): Payer: 59 | Admitting: Family Medicine

## 2014-03-16 VITALS — BP 108/80 | HR 75 | Resp 16 | Ht 62.0 in | Wt 283.0 lb

## 2014-03-16 DIAGNOSIS — G4700 Insomnia, unspecified: Secondary | ICD-10-CM

## 2014-03-16 DIAGNOSIS — H65192 Other acute nonsuppurative otitis media, left ear: Secondary | ICD-10-CM

## 2014-03-16 DIAGNOSIS — E669 Obesity, unspecified: Secondary | ICD-10-CM

## 2014-03-16 DIAGNOSIS — E119 Type 2 diabetes mellitus without complications: Secondary | ICD-10-CM

## 2014-03-16 DIAGNOSIS — E1169 Type 2 diabetes mellitus with other specified complication: Secondary | ICD-10-CM

## 2014-03-16 DIAGNOSIS — J301 Allergic rhinitis due to pollen: Secondary | ICD-10-CM

## 2014-03-16 DIAGNOSIS — H6590 Unspecified nonsuppurative otitis media, unspecified ear: Secondary | ICD-10-CM | POA: Insufficient documentation

## 2014-03-16 DIAGNOSIS — D509 Iron deficiency anemia, unspecified: Secondary | ICD-10-CM

## 2014-03-16 DIAGNOSIS — E559 Vitamin D deficiency, unspecified: Secondary | ICD-10-CM

## 2014-03-16 MED ORDER — FLUCONAZOLE 150 MG PO TABS: ORAL_TABLET | ORAL | Status: DC

## 2014-03-16 MED ORDER — AZITHROMYCIN 250 MG PO TABS: ORAL_TABLET | ORAL | Status: DC

## 2014-03-16 MED ORDER — ERGOCALCIFEROL 1.25 MG (50000 UT) PO CAPS: 50000.0000 [IU] | ORAL_CAPSULE | ORAL | Status: DC

## 2014-03-16 MED ORDER — FLUTICASONE PROPIONATE 50 MCG/ACT NA SUSP: 2.0000 | Freq: Every day | NASAL | Status: DC

## 2014-03-16 NOTE — Assessment & Plan Note (Signed)
Improved. Pt applauded on succesful weight loss through lifestyle change, and encouraged to continue same. Weight loss goal set for the next several months. ral months.

## 2014-03-16 NOTE — Assessment & Plan Note (Signed)
Improved Patient advised to reduce carb and sweets, commit to regular physical activity, take meds as prescribed, test blood as directed, and attempt to lose weight, to improve blood sugar control.

## 2014-03-16 NOTE — Assessment & Plan Note (Signed)
Continue weekly vit D Updated lab needed at/ before next visit.  

## 2014-03-16 NOTE — Assessment & Plan Note (Signed)
Unchanged, non compliant with meds, she is to start taking iron daily as prescribed, she also c/o fatigue Updated lab needed at/ before next visit.

## 2014-03-16 NOTE — Assessment & Plan Note (Signed)
Pt encouraged to use her meds regularly , since she is having increased allergy symptoms

## 2014-03-16 NOTE — Patient Instructions (Addendum)
F/u in 4  month, call if you need me before  Congrats on improving labs  NEED TO TAKE DAILY IRON AND WEEKLY VIT D  Aggresive allergy management to include sudafed one daily for next 5 day  Z pack sent for otitis media also fluconazole #1   Iron, ferritin , Vit D HbA1C chem 7 and EGFR in 4 months, non fast  Please schedule your mammogram  It is important that you exercise regularly at least 30 minutes 5 times a week. If you develop chest pain, have severe difficulty breathing, or feel very tired, stop exercising immediately and seek medical attention   Aim for 8 to `10 pounds

## 2014-03-16 NOTE — Progress Notes (Signed)
   Subjective:    Patient ID: [name], female    DOB: [birth year], 38 y.o.   MRN: [id number]  HPI The PT is here for follow up and re-evaluation of chronic medical conditions, medication management and review of any available recent lab and radiology data.  Preventive health is updated, specifically  Cancer screening and Immunization.  Needs mammogram , she will schedule. Questions or concerns regarding consultations or procedures which the PT has had in the interim are  addressed. The PT denies any adverse reactions to current medications since the last visit.  1 week h/o left ear pain, increased sinus pressure , denies sore throat, denies cough. Denies polyuria, polydipsia, blurred vision , or hypoglycemic episodes.      Review of Systems See HPI Denies chest congestion, productive cough or wheezing. Denies chest pains, palpitations and leg swelling Denies abdominal pain, nausea, vomiting,diarrhea or constipation.   Denies dysuria, frequency, hesitancy or incontinence. Denies joint pain, swelling and limitation in mobility. Denies headaches, seizures, numbness, or tingling. Denies depression, anxiety or insomnia. Denies skin break down or rash.        Objective:   Physical Exam BP 108/80 mmHg  Pulse 75  Resp 16  Ht 5\' 2"  (1.575 m)  Wt 283 lb (128.368 kg)  BMI 51.75 kg/m2  SpO2 99% Patient alert and oriented and in no cardiopulmonary distress.  HEENT: No facial asymmetry, EOMI,   oropharynx pink and moist.  Neck supple no JVD, no mass. Left TM slightly erythematous with dull light reflex Chest: Clear to auscultation bilaterally.  CVS: S1, S2 no murmurs, no S3.Regular rate.  ABD: Soft non tender.   Ext: No edema  MS: Adequate ROM spine, shoulders, hips and knees.  Skin: Intact, no ulcerations or rash noted.  Psych: Good eye contact, normal affect. Memory intact not anxious or depressed appearing.  CNS: CN 2-12 intact, power,  normal throughout.no focal  deficits noted.        Assessment & Plan:  Non-suppurative otitis media Antibiotic prescribed  Diabetes mellitus type 2 in obese Improved Patient advised to reduce carb and sweets, commit to regular physical activity, take meds as prescribed, test blood as directed, and attempt to lose weight, to improve blood sugar control.   Obesity, morbid Improved. Pt applauded on succesful weight loss through lifestyle change, and encouraged to continue same. Weight loss goal set for the next several months. ral months.     Vitamin D deficiency Continue weekly vit D Updated lab needed at/ before next visit.   Insomnia Sleep hygiene reviewed and written information offered also. Prescription sent for  medication needed.Uses on avg twice weekly, stress at work is lessened so need for medication has lessened also which is good   ALLERGIC RHINITIS, SEASONAL Pt encouraged to use her meds regularly , since she is having increased allergy symptoms  Iron deficiency anemia Unchanged, non compliant with meds, she is to start taking iron daily as prescribed, she also c/o fatigue Updated lab needed at/ before next visit.

## 2014-03-16 NOTE — Assessment & Plan Note (Addendum)
Sleep hygiene reviewed and written information offered also. Prescription sent for  medication needed.Uses on avg twice weekly, stress at work is lessened so need for medication has lessened also which is good

## 2014-03-16 NOTE — Assessment & Plan Note (Signed)
Antibiotic prescribed 

## 2014-03-18 LAB — MICROALBUMIN / CREATININE URINE RATIO
Creatinine, Urine: 127 mg/dL
MICROALB/CREAT RATIO: 3.9 mg/g (ref 0.0–30.0)
Microalb, Ur: 0.5 mg/dL (ref <2.0)

## 2014-04-09 ENCOUNTER — Other Ambulatory Visit: Payer: Self-pay | Admitting: Family Medicine

## 2014-05-07 ENCOUNTER — Other Ambulatory Visit: Payer: Self-pay | Admitting: Family Medicine

## 2014-06-11 ENCOUNTER — Ambulatory Visit (INDEPENDENT_AMBULATORY_CARE_PROVIDER_SITE_OTHER): Payer: Self-pay | Admitting: Family Medicine

## 2014-06-11 VITALS — BP 110/66 | Wt 288.0 lb

## 2014-06-11 DIAGNOSIS — E119 Type 2 diabetes mellitus without complications: Secondary | ICD-10-CM

## 2014-06-11 DIAGNOSIS — E1169 Type 2 diabetes mellitus with other specified complication: Secondary | ICD-10-CM

## 2014-06-11 DIAGNOSIS — E669 Obesity, unspecified: Secondary | ICD-10-CM

## 2014-06-11 NOTE — Assessment & Plan Note (Signed)
Subjective:  Patient presents today for an annual pharmacy visit and 3 month diabetes follow-up as part of the employer-sponsored [employer] to Wellness program. Current diabetes regimen includes metofrmin 1000 mg BID and Invokana 300 mg once daily.  Patient reports that things have been going well since she last saw Marcie Bal. Her last appointment with Dr. Moshe Cipro was in November last year. A1C at that time was 6.0%. She has an appointment pending next in March to see her. Since last A1C was so well controlled I will defer A1C testing to that visit.  No medication changes since last visit. I removed Ferrex from her med profile since patient has not been taking this.   Disease Assessments:  Diabetes: Sees Diabetes provider 2 times per year; MD managing Diabetes Dr. Tula Nakayama; checks feet daily; takes medications as prescribed; does not use glucometer; does not take an aspirin a day; checks blood glucose Never; What is target A1c? <7.0% %; hypoglycemia frequency rarely;   Other Diabetes History: Patient states that she is not currently checking blood sugar at this time. She has a meter and test strips.   Past Medical/Surgical History:  The patient has a past medical history of Diabetes Mellitus type 2, Obesity, Allergies/Hayfever.   Hospitalizations: Denies ED visits in the past year; Denies Hospital visits in the past year  Other Medical History: uses IUD  Prior Surgery: c section x 2   Tobacco Assessment: Smoking Status: Never smoker; Last Reviewed: 06/11/2014   Social History:  Alcohol use: 1 - 2 drinks per year; Diet adherence 25-50% of the time; Patient knows the purpose/use of medications; Patient can afford medications; Exercise adherence 1-2 days a week; 120 minutes of exercise per week; Medication adherence adherent, non adherent Patient is adherent to taking diabetes medications. She states that she inconsistently takes Iron capsules and Phentermine.  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 She estimates that she is getting 2-3 hours of physical activity each week.  Nutrition- She states that she tries to count carbs with meals. She admits to still eating "junk food". She tries to control portion sizes and limit portion sizes.  She states that she needs to cut the junk food out as much as she can.    Preventive Care:    Hemoglobin A1c: 03/08/2014 Via Dr. Griffin Dakin office 6.0    Pap Smear: 06/24/2013      Vital Signs:  06/11/2014 4:23 PM (EST)Blood Pressure 110 / 66 mm/HgBMI 52.7; Height 5 ft 2 in; Weight 288 lbs   Testing:  Blood Sugar Tests: Hemoglobin A1c: 6.0 via EPIC resulted on 03/08/2014        Care Planning:  Learning Preference Assessment:  Learner: Patient, Family  Readiness to Learn Barriers: None  Teaching Method: Explanation  Evaluation of Learning: Needs review and assistance  Readiness to Change:  How important is your health to you? 9  How confident are you in working to improve your health? 8  How ready are you to change to improve your health? 9  Total Score: 9  Care Plan:  06/11/2014 6:02 PM (EST) (2)  Problem: Snacking on chips  Role: Clinical Pharmacist  Long Term Goal Stop buying the chips in the big bag and buy the snack sized bags. Limit to 2 bags of chips a week.  Date Started: 06/11/2014  06/11/2014 4:23  PM (EST) (1)       Assessment/Plan:  Patient is a 39 year old female with DM2. Most recent A1C was 6.0% and is meeting goal of less than 7%. I reviewed and updated patient's medication profile in accordance with her annual pharmacy visit. I also reviewed with patient how to access the wellness website and claim her benefits. I encouraged her to complete the wellness profile so that she could earn another $50 badge.  Weight is up 5 pounds since her last visit with Marcie Bal in  November. She admits that she has a hard time snacking on chips especially. She wants to cut back to eating chips twice weekly. Patient is not currently checking blood sugar. She does have a meter and test strips. I encouraged her to check at least once a week.  Patient will follow up with Kelli Churn, RNCM, CDE  Goals for Next Visit- 1. Cut back eating chips to twice a week. Stop buying chips in the big bag and just buy the snack sized bags so that you will control your portion sizes. 2. 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 Complete the wellness profile (on the main page about halfway down). Marcie Bal will schedule your next appointment.

## 2014-06-15 ENCOUNTER — Other Ambulatory Visit: Payer: Self-pay | Admitting: Family Medicine

## 2014-06-24 NOTE — Progress Notes (Signed)
Patient ID: [name], female   DOB: [birth year], 39 y.o.   MRN: [id number] Reviewed: Agree with the documentation and management of our [place].

## 2014-07-05 ENCOUNTER — Encounter: Payer: Self-pay | Admitting: Family Medicine

## 2014-07-05 ENCOUNTER — Ambulatory Visit (INDEPENDENT_AMBULATORY_CARE_PROVIDER_SITE_OTHER): Payer: 59 | Admitting: Family Medicine

## 2014-07-05 VITALS — BP 110/64 | HR 86 | Resp 16 | Ht 62.0 in | Wt 296.0 lb

## 2014-07-05 DIAGNOSIS — E559 Vitamin D deficiency, unspecified: Secondary | ICD-10-CM

## 2014-07-05 DIAGNOSIS — G4700 Insomnia, unspecified: Secondary | ICD-10-CM

## 2014-07-05 DIAGNOSIS — D519 Vitamin B12 deficiency anemia, unspecified: Secondary | ICD-10-CM

## 2014-07-05 DIAGNOSIS — K219 Gastro-esophageal reflux disease without esophagitis: Secondary | ICD-10-CM

## 2014-07-05 DIAGNOSIS — E119 Type 2 diabetes mellitus without complications: Secondary | ICD-10-CM

## 2014-07-05 DIAGNOSIS — Z1159 Encounter for screening for other viral diseases: Secondary | ICD-10-CM

## 2014-07-05 DIAGNOSIS — E669 Obesity, unspecified: Secondary | ICD-10-CM

## 2014-07-05 DIAGNOSIS — J301 Allergic rhinitis due to pollen: Secondary | ICD-10-CM

## 2014-07-05 DIAGNOSIS — E1169 Type 2 diabetes mellitus with other specified complication: Secondary | ICD-10-CM

## 2014-07-05 MED ORDER — PHENTERMINE HCL 37.5 MG PO CAPS: 37.5000 mg | ORAL_CAPSULE | ORAL | Status: DC

## 2014-07-05 NOTE — Assessment & Plan Note (Signed)
Updated lab needed at/ before next visit.   

## 2014-07-05 NOTE — Assessment & Plan Note (Signed)
Deteriorated. Patient re-educated about  the importance of commitment to a  minimum of 150 minutes of exercise per week. The importance of healthy food choices with portion control discussed. Encouraged to start a food diary, count calories and to consider  joining a support group. Sample diet sheets offered. Goals set by the patient for the next several months.    

## 2014-07-05 NOTE — Assessment & Plan Note (Signed)
Improved, as needed, PPI use only at this time

## 2014-07-05 NOTE — Patient Instructions (Addendum)
F/U in 3 month  New is phentermine capsule one daily  Weight loss goal of 4 pounds per month  It is important that you exercise regularly at least 30 minutes 5 times a week. If you develop chest pain, have severe difficulty breathing, or feel very tired, stop exercising immediately and seek medical attention    Will follow up labs, and send message  It is important that you exercise regularly at least 30 minutes 5 times a week. If you develop chest pain, have severe difficulty breathing, or feel very tired, stop exercising immediately and seek medical attention

## 2014-07-05 NOTE — Assessment & Plan Note (Signed)
Increased symptoms last week, use daily flonase for control

## 2014-07-05 NOTE — Progress Notes (Signed)
   Subjective:    Patient ID: [name], female    DOB: 1975/07/26, 39 y.o.   MRN: [id number]  HPI The PT is here for follow up and re-evaluation of chronic medical conditions, medication management and review of any available recent lab and radiology data.  Preventive health is updated, specifically  Cancer screening and Immunization.   Questions or concerns regarding consultations or procedures which the PT has had in the interim are  addressed. The PT denies any adverse reactions to current medications since the last visit.  There are no new concerns.  There are no specific complaints    Denies polyuria, polydipsia, blurred vision , or hypoglycemic episodes. Concerned about weight gain but does report seeking comfort in food for uncontrolled anxiety which she knows it not to her  benefit   Review of Systems See HPI Denies recent fever or chills. Denies sinus pressure, nasal congestion, ear pain or sore throat. Denies chest congestion, productive cough or wheezing. Denies chest pains, palpitations and leg swelling Denies abdominal pain, nausea, vomiting,diarrhea or constipation.   Denies dysuria, frequency, hesitancy or incontinence. Denies joint pain, swelling and limitation in mobility. Denies headaches, seizures, numbness, or tingling. Denies depression,  Does have increased anxiety and  insomnia. Denies skin break down or rash.        Objective:   Physical Exam BP 110/64 mmHg  Pulse 86  Resp 16  Ht 5\' 2"  (1.575 m)  Wt 296 lb (134.265 kg)  BMI 54.13 kg/m2  SpO2 99% Patient alert and oriented and in no cardiopulmonary distress.  HEENT: No facial asymmetry, EOMI,   oropharynx pink and moist.  Neck supple no JVD, no mass.  Chest: Clear to auscultation bilaterally.  CVS: S1, S2 no murmurs, no S3.Regular rate.  ABD: Soft non tender.   Ext: No edema  MS: Adequate ROM spine, shoulders, hips and knees.  Skin: Intact, no ulcerations or rash noted.  Psych:  Good eye contact, normal affect. Memory intact not anxious or depressed appearing.  CNS: CN 2-12 intact, power,  normal throughout.no focal deficits noted.        Assessment & Plan:  Diabetes mellitus type 2 in obese Patient advised to reduce carb and sweets, commit to regular physical activity, take meds as prescribed, test blood as directed, and attempt to lose weight, to improve blood sugar control. Updated lab needed at/ before next visit.    ALLERGIC RHINITIS, SEASONAL Increased symptoms last week, use daily flonase for control   GERD Improved, as needed, PPI use only at this time   Insomnia Deteriorated increased stress 80% job relsted, encouraged seeking counselling through job, also sleep hygiene reviewed, coloring books have helped a lot with de stressing Encouraged recommitment to regular exercise alo Sleep hygiene reviewed and written information offered also. Prescription sent for  medication needed.    Obesity, morbid Deteriorated. Patient re-educated about  the importance of commitment to a  minimum of 150 minutes of exercise per week. The importance of healthy food choices with portion control discussed. Encouraged to start a food diary, count calories and to consider  joining a support group. Sample diet sheets offered. Goals set by the patient for the next several months.      Vitamin D deficiency Updated lab needed at/ before next visit.    B12 deficiency anemia Updated lab needed at/ before next visit.

## 2014-07-05 NOTE — Assessment & Plan Note (Signed)
Patient advised to reduce carb and sweets, commit to regular physical activity, take meds as prescribed, test blood as directed, and attempt to lose weight, to improve blood sugar control. Updated lab needed at/ before next visit.  

## 2014-07-05 NOTE — Assessment & Plan Note (Addendum)
Deteriorated increased stress 80% job relsted, encouraged seeking counselling through job, also sleep hygiene reviewed, coloring books have helped a lot with de stressing Encouraged recommitment to regular exercise alo Sleep hygiene reviewed and written information offered also. Prescription sent for  medication needed.

## 2014-07-29 LAB — COMPLETE METABOLIC PANEL WITH GFR
ALK PHOS: 53 U/L (ref 39–117)
ALT: 8 U/L (ref 0–35)
AST: 10 U/L (ref 0–37)
Albumin: 4.1 g/dL (ref 3.5–5.2)
BILIRUBIN TOTAL: 0.3 mg/dL (ref 0.2–1.2)
BUN: 12 mg/dL (ref 6–23)
CO2: 26 meq/L (ref 19–32)
Calcium: 8.7 mg/dL (ref 8.4–10.5)
Chloride: 107 mEq/L (ref 96–112)
Creat: 0.59 mg/dL (ref 0.50–1.10)
Glucose, Bld: 84 mg/dL (ref 70–99)
Potassium: 4.2 mEq/L (ref 3.5–5.3)
SODIUM: 140 meq/L (ref 135–145)
Total Protein: 6.8 g/dL (ref 6.0–8.3)

## 2014-07-29 LAB — VITAMIN D 25 HYDROXY (VIT D DEFICIENCY, FRACTURES): Vit D, 25-Hydroxy: 9 ng/mL — ABNORMAL LOW (ref 30–100)

## 2014-07-29 LAB — HIV ANTIBODY (ROUTINE TESTING W REFLEX): HIV 1&2 Ab, 4th Generation: NONREACTIVE

## 2014-07-29 LAB — HEMOGLOBIN A1C
Hgb A1c MFr Bld: 6.2 % — ABNORMAL HIGH (ref <5.7)
Mean Plasma Glucose: 131 mg/dL — ABNORMAL HIGH (ref <117)

## 2014-07-29 LAB — IRON: IRON: 40 ug/dL — AB (ref 42–145)

## 2014-07-29 LAB — FERRITIN: FERRITIN: 19 ng/mL (ref 10–291)

## 2014-07-30 ENCOUNTER — Other Ambulatory Visit: Payer: Self-pay

## 2014-07-30 DIAGNOSIS — E559 Vitamin D deficiency, unspecified: Secondary | ICD-10-CM

## 2014-07-30 MED ORDER — ERGOCALCIFEROL 1.25 MG (50000 UT) PO CAPS: 50000.0000 [IU] | ORAL_CAPSULE | ORAL | Status: DC

## 2014-08-20 ENCOUNTER — Encounter: Payer: Self-pay | Admitting: *Deleted

## 2014-09-07 ENCOUNTER — Encounter: Payer: Self-pay | Admitting: *Deleted

## 2014-09-13 ENCOUNTER — Ambulatory Visit: Payer: 59 | Admitting: *Deleted

## 2014-10-05 ENCOUNTER — Ambulatory Visit (INDEPENDENT_AMBULATORY_CARE_PROVIDER_SITE_OTHER): Payer: 59 | Admitting: Family Medicine

## 2014-10-05 VITALS — BP 108/64 | HR 86 | Resp 18 | Ht 62.0 in | Wt 288.0 lb

## 2014-10-05 DIAGNOSIS — Z1239 Encounter for other screening for malignant neoplasm of breast: Secondary | ICD-10-CM | POA: Diagnosis not present

## 2014-10-05 DIAGNOSIS — D509 Iron deficiency anemia, unspecified: Secondary | ICD-10-CM | POA: Diagnosis not present

## 2014-10-05 DIAGNOSIS — E119 Type 2 diabetes mellitus without complications: Secondary | ICD-10-CM

## 2014-10-05 DIAGNOSIS — G4700 Insomnia, unspecified: Secondary | ICD-10-CM

## 2014-10-05 DIAGNOSIS — E669 Obesity, unspecified: Secondary | ICD-10-CM

## 2014-10-05 DIAGNOSIS — M722 Plantar fascial fibromatosis: Secondary | ICD-10-CM | POA: Diagnosis not present

## 2014-10-05 DIAGNOSIS — E1169 Type 2 diabetes mellitus with other specified complication: Secondary | ICD-10-CM

## 2014-10-05 MED ORDER — PHENTERMINE HCL 37.5 MG PO CAPS: 37.5000 mg | ORAL_CAPSULE | ORAL | Status: DC

## 2014-10-05 MED ORDER — IBUPROFEN 800 MG PO TABS: 800.0000 mg | ORAL_TABLET | Freq: Three times a day (TID) | ORAL | Status: DC

## 2014-10-05 MED ORDER — PREDNISONE 5 MG (21) PO TBPK: 5.0000 mg | ORAL_TABLET | ORAL | Status: DC

## 2014-10-05 NOTE — Progress Notes (Signed)
Subjective:    Patient ID: [name], female    DOB: 21-Jul-1975, 39 y.o.   MRN: [id number]  HPI    [name]     MRN: [id number]      DOB: 1976/04/15   HPI [name] is here for follow up and re-evaluation of chronic medical conditions, medication management and review of any available recent lab and radiology data.  Preventive health is updated, specifically  Cancer screening and Immunization.   Questions or concerns regarding consultations or procedures which the PT has had in the interim are  addressed. The PT denies any adverse reactions to current medications since the last visit.  Denies polyuria, polydipsia, blurred vision , or hypoglycemic episodes. C/o left foot and heel tenderness x 2 weeks, no recent trauma , has ahd similar complaint in the past   ROS Denies recent fever or chills. Denies sinus pressure, nasal congestion, ear pain or sore throat. Denies chest congestion, productive cough or wheezing. Denies chest pains, palpitations and leg swelling Denies abdominal pain, nausea, vomiting,diarrhea or constipation.   Denies dysuria, frequency, hesitancy or incontinence. Denies headaches, seizures, numbness, or tingling. Denies depression, anxiety or insomnia. Denies skin break down or rash.   PE  BP 108/64 mmHg  Pulse 86  Resp 18  Ht 5\' 2"  (1.575 m)  Wt 288 lb (130.636 kg)  BMI 52.66 kg/m2  SpO2 98%  Patient alert and oriented and in no cardiopulmonary distress.  HEENT: No facial asymmetry, EOMI,   oropharynx pink and moist.  Neck supple no JVD, no mass.  Chest: Clear to auscultation bilaterally.  CVS: S1, S2 no murmurs, no S3.Regular rate.  ABD: Soft non tender.   Ext: No edema  MS: Adequate ROM spine, shoulders, hips and knees.Pain on direct pressure on left heel and instep, achilles tendon intact  Skin: Intact, no ulcerations or rash noted.  Psych: Good eye contact, normal affect. Memory intact not anxious or depressed  appearing.  CNS: CN 2-12 intact, power,  normal throughout.no focal deficits noted.   Assessment & Plan   Diabetes mellitus type 2 in obese Controlled, no change in medication [name] is reminded of the importance of commitment to daily physical activity for 30 minutes or more, as able and the need to limit carbohydrate intake to 30 to 60 grams per meal to help with blood sugar control.   The need to take medication as prescribed, test blood sugar as directed, and to call between visits if there is a concern that blood sugar is uncontrolled is also discussed.   [name] is reminded of the importance of daily foot exam, annual eye examination, and good blood sugar, blood pressure and cholesterol control.  Diabetic Labs Latest Ref Rng 07/28/2014 03/16/2014 03/08/2014 09/07/2013 05/04/2013  HbA1c <5.7 % 6.2(H) - 6.0(H) 6.3(H) 6.4(H)  Microalbumin <2.0 mg/dL - 0.5 - - -  Micro/Creat Ratio 0.0 - 30.0 mg/g - 3.9 - - -  Chol 0 - 200 mg/dL - - 137 - -  HDL >39 mg/dL - - 56 - -  Calc LDL 0 - 99 mg/dL - - 74 - -  Triglycerides <150 mg/dL - - 37 - -  Creatinine 0.50 - 1.10 mg/dL 0.59 - 0.66 0.73 0.63   BP/Weight 10/05/2014 07/05/2014 06/11/2014 03/16/2014 09/09/2013 09/02/2013 6/83/4196  Systolic BP 222 979 892 119 417 408 144  Diastolic BP 64 64 66 80 72 80 74  Wt. (Lbs) 288 296 288 283 269 289 287.4  BMI  52.66 54.13 52.66 51.75 49.19 52.85 52.55   Foot/eye exam completion dates Latest Ref Rng 07/05/2014 03/11/2014  Eye Exam No Retinopathy - No Retinopathy  Foot Form Completion - Done -         Obesity, morbid Improved Patient re-educated about  the importance of commitment to a  minimum of 150 minutes of exercise per week.  The importance of healthy food choices with portion control discussed. Encouraged to start a food diary, count calories and to consider  joining a support group. Sample diet sheets offered. Goals set by the patient for the next several months.   Weight /BMI  10/05/2014 07/05/2014 06/11/2014  WEIGHT 288 lb 296 lb 288 lb  HEIGHT 5\' 2"  5\' 2"  -  BMI 52.66 kg/m2 54.13 kg/m2 52.66 kg/m2    Current exercise per week 90 minutes.   Plantar fasciitis, left Short course of anti inflammatories prescribed, pt to call for podiatry eval if symptoms do not improve in next 10 days  Iron deficiency anemia Need for ongoing iron supplement evident, may even require parenteral iron, she will be more consistent with oral supplement to avert need for parenteral iron  Insomnia Improved, only uses  Prescribed med on avg 2 to 3 times per week, which is good, encouraged to continue to work on sleep hygiene and regular exercise to negate need entirely      Review of Systems     Objective:   Physical Exam        Assessment & Plan:

## 2014-10-05 NOTE — Patient Instructions (Addendum)
F/u in 2 months, call if you need me before  Non fasting Chem 7 and EGFR and HBA1c in 2 months,  Iron and ferritin  CONGRATS on 8 pound weight  loss, increase activity and be calorie diligent to continue the good work  Continue phentermine as before  Fascitis pain is being treated witth short course of anti inflammatories, do stretching exercise and use topical ice massages at night  Mammogram is needed we will call with appt

## 2014-10-06 ENCOUNTER — Other Ambulatory Visit: Payer: Self-pay | Admitting: Family Medicine

## 2014-10-06 DIAGNOSIS — Z1231 Encounter for screening mammogram for malignant neoplasm of breast: Secondary | ICD-10-CM

## 2014-10-07 ENCOUNTER — Telehealth: Payer: Self-pay | Admitting: Family Medicine

## 2014-10-07 NOTE — Telephone Encounter (Signed)
done

## 2014-10-18 ENCOUNTER — Ambulatory Visit (HOSPITAL_COMMUNITY)
Admission: RE | Admit: 2014-10-18 | Discharge: 2014-10-18 | Disposition: A | Discharge status: Home / Self Care | Payer: 59 | Source: Ambulatory Visit | Attending: Family Medicine | Admitting: Family Medicine

## 2014-10-18 DIAGNOSIS — Z1231 Encounter for screening mammogram for malignant neoplasm of breast: Secondary | ICD-10-CM | POA: Insufficient documentation

## 2014-10-25 ENCOUNTER — Encounter: Payer: Self-pay | Admitting: Family Medicine

## 2014-10-25 NOTE — Assessment & Plan Note (Signed)
Short course of anti inflammatories prescribed, pt to call for podiatry eval if symptoms do not improve in next 10 days

## 2014-10-25 NOTE — Assessment & Plan Note (Signed)
Need for ongoing iron supplement evident, may even require parenteral iron, she will be more consistent with oral supplement to avert need for parenteral iron

## 2014-10-25 NOTE — Assessment & Plan Note (Signed)
Improved Patient re-educated about  the importance of commitment to a  minimum of 150 minutes of exercise per week.  The importance of healthy food choices with portion control discussed. Encouraged to start a food diary, count calories and to consider  joining a support group. Sample diet sheets offered. Goals set by the patient for the next several months.   Weight /BMI 10/05/2014 07/05/2014 06/11/2014  WEIGHT 288 lb 296 lb 288 lb  HEIGHT 5\' 2"  5\' 2"  -  BMI 52.66 kg/m2 54.13 kg/m2 52.66 kg/m2    Current exercise per week 90 minutes.

## 2014-10-25 NOTE — Assessment & Plan Note (Signed)
Improved, only uses  Prescribed med on avg 2 to 3 times per week, which is good, encouraged to continue to work on sleep hygiene and regular exercise to negate need entirely

## 2014-10-25 NOTE — Assessment & Plan Note (Signed)
Controlled, no change in medication Ms. Scarfo is reminded of the importance of commitment to daily physical activity for 30 minutes or more, as able and the need to limit carbohydrate intake to 30 to 60 grams per meal to help with blood sugar control.   The need to take medication as prescribed, test blood sugar as directed, and to call between visits if there is a concern that blood sugar is uncontrolled is also discussed.   Ms. Eastwood is reminded of the importance of daily foot exam, annual eye examination, and good blood sugar, blood pressure and cholesterol control.  Diabetic Labs Latest Ref Rng 07/28/2014 03/16/2014 03/08/2014 09/07/2013 05/04/2013  HbA1c <5.7 % 6.2(H) - 6.0(H) 6.3(H) 6.4(H)  Microalbumin <2.0 mg/dL - 0.5 - - -  Micro/Creat Ratio 0.0 - 30.0 mg/g - 3.9 - - -  Chol 0 - 200 mg/dL - - 137 - -  HDL >39 mg/dL - - 56 - -  Calc LDL 0 - 99 mg/dL - - 74 - -  Triglycerides <150 mg/dL - - 37 - -  Creatinine 0.50 - 1.10 mg/dL 0.59 - 0.66 0.73 0.63   BP/Weight 10/05/2014 07/05/2014 06/11/2014 03/16/2014 09/09/2013 09/02/2013 5/74/7340  Systolic BP 370 964 383 818 403 754 360  Diastolic BP 64 64 66 80 72 80 74  Wt. (Lbs) 288 296 288 283 269 289 287.4  BMI 52.66 54.13 52.66 51.75 49.19 52.85 52.55   Foot/eye exam completion dates Latest Ref Rng 07/05/2014 03/11/2014  Eye Exam No Retinopathy - No Retinopathy  Foot Form Completion - Done -

## 2014-11-03 ENCOUNTER — Other Ambulatory Visit: Payer: Self-pay | Admitting: *Deleted

## 2014-11-04 ENCOUNTER — Encounter: Payer: Self-pay | Admitting: *Deleted

## 2014-11-04 NOTE — Patient Outreach (Signed)
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 She struggles with sweets. Takes her phentermine when she remembers it but that is not often. She has not seen Dr. Kaylyn Lim for phentermine follow up since May 2015. Says she was bothered back in March with left foot plantar fasciitis which is a chronic problem for her but is not bothered with it currently. When she has exacerbations of fasciitis, she is unable to exercise. States DM medication adherence is not an issue. She rarely checks her blood sugar and remains morbidly obese despite the phentermine but her weight has decreased since the last visit.    Objective:   Review of Systems  Constitutional: Negative.    Filed Vitals:   11/03/14 1604  BP: 94/60   Filed Weights   11/03/14 1604  Weight: 285 lb (129.275 kg)   Physical Exam  Constitutional: She is oriented to person, place, and time. She appears well-developed and well-nourished.  Neurological: She is alert and oriented to person, place, and time.  Skin: Skin is warm and dry.  Psychiatric: She has a normal mood and affect. Her behavior is normal. Thought content normal.    Current Medications:   Current Outpatient Prescriptions  Medication Sig Dispense Refill  . BYDUREON 2 MG SUSR INJECT 2MG  INTO THE SKIN ONCE WEEKLY 12 each 3  . ergocalciferol (VITAMIN D2) 50000 UNITS capsule Take 1 capsule (50,000 Units total) by mouth once a week. One capsule once weekly 12 capsule 1  . fluticasone (FLONASE) 50 MCG/ACT nasal spray Place 2 sprays into both nostrils daily. 48 g 3  . ibuprofen (ADVIL,MOTRIN) 800 MG tablet Take 1 tablet (800 mg total) by mouth 3 (three) times daily. 21  tablet 0  . INVOKANA 300 MG TABS tablet TAKE 1 TABLET BY MOUTH DAILY 90 tablet 3  . iron polysaccharides (NU-IRON) 150 MG capsule Take 1 capsule (150 mg total) by mouth daily. 90 capsule 3  . metFORMIN (GLUCOPHAGE) 1000 MG tablet TAKE 1 TABLET BY MOUTH TWICE DAILY WITH A MEAL 180 tablet 3  . omeprazole (PRILOSEC) 40 MG capsule TAKE 1 CAPSULE BY MOUTH DAILY 90 capsule PRN  . phentermine 37.5 MG capsule Take 1 capsule (37.5 mg total) by mouth every morning. 30 capsule 4  . predniSONE (STERAPRED UNI-PAK 21 TAB) 5 MG (21) TBPK tablet Take 1 tablet (5 mg total) by mouth as directed. Use as directed (Patient not taking: Reported on 11/03/2014) 21 tablet 0  . temazepam (RESTORIL) 15 MG capsule Take 1 capsule (15 mg total) by mouth at bedtime as needed for sleep. (Patient not taking: Reported on 11/03/2014) 30 capsule 0   No current facility-administered medications for this visit.    Functional Status:   In your present state of health, do you have any difficulty performing the following activities: 11/03/2014 10/05/2014  Hearing? N N  Vision? Y N  Difficulty concentrating or making decisions? N N  Walking or climbing stairs? N N  Dressing or bathing? N N  Doing errands, shopping? N N    Fall/Depression Screening:    PHQ 2/9 Scores 11/03/2014 07/05/2014 03/02/2013  PHQ - 2 Score 0 0 0  PHQ- 9 Score - 3 -  [facility] CM Care Plan Problem One        Patient Outreach from 11/03/2014 in [place] Problem One  Type II DM consistetnly meeting A1C of <7.0%   Care Plan for Problem One  Active   THN Long Term Goal (31-90 days)  Ongoing good control of Type II DM as evidenced by meeting target A1C at each check   THN Long Term Goal Start Date  11/03/14   Interventions for Problem One Long Term Goal  reviewed pathphysiology of Type II DM, reviewed pre and post meal target CBG, encouraged increased CBG checks to identify foods/drinks that elevate CBG above target, encouraged  increased  frequencly of exercise to improve insulin sensitivity, will arrange for Link To Wellness follow up  in 6 months       Assessment:   [employer] employee and Link to Wellness with Type II DM consistently meeting A1C target of <7.0% since 06/26/2010.  Plan:  RNCM to fax today's office visit note to Dr. Moshe Cipro. RNCM will meet every 6 months and as needed with patient per Link To Wellness program guidelines to assist with Type II DM self-management and assess patient's progress toward mutually set goals.   Barrington Ellison RN,CCM,CDE [place] Management Coordinator Link To Wellness Office Phone [phone] Office Fax [phone]

## 2014-12-06 ENCOUNTER — Ambulatory Visit: Payer: 59 | Admitting: Family Medicine

## 2015-03-02 ENCOUNTER — Ambulatory Visit: Payer: 59 | Admitting: *Deleted

## 2015-03-03 ENCOUNTER — Other Ambulatory Visit: Payer: Self-pay | Admitting: *Deleted

## 2015-03-03 NOTE — Patient Outreach (Signed)
E-mail received from patient stating she is sick and has a fever and cannot make her Link To Wellness appointment with her daughter [name] who is also a member of the program. This RNCM replied to the e-mail with options for other dates to reschedule the appointment before next year. Await Jakeia's response.

## 2015-03-28 ENCOUNTER — Other Ambulatory Visit: Payer: Self-pay | Admitting: *Deleted

## 2015-03-28 DIAGNOSIS — E119 Type 2 diabetes mellitus without complications: Secondary | ICD-10-CM

## 2015-03-28 LAB — POCT CBG (FASTING - GLUCOSE)-MANUAL ENTRY: GLUCOSE FASTING, POC: 84 mg/dL (ref 70–99)

## 2015-03-28 LAB — POCT GLYCOSYLATED HEMOGLOBIN (HGB A1C): HEMOGLOBIN A1C: 5.7

## 2015-03-29 ENCOUNTER — Encounter: Payer: Self-pay | Admitting: *Deleted

## 2015-03-29 NOTE — Patient Outreach (Signed)
[facility]) Care Management   03/28/15  [name] [birth year] [id number]  [name] is an 39 y.o. female who presents to the [place] Management office with her daughters [name] and [name] for routine Link To Wellness follow up for self management assistance with Type II DM.   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 She is requesting that POC Hgb A1Cs be checked for both herself and her daughter [name].   Objective:   Review of Systems  Constitutional: Negative.     Physical Exam  Constitutional: She is oriented to person, place, and time. She appears well-developed and well-nourished.  Respiratory: Effort normal.  Neurological: She is alert and oriented to person, place, and time.  Skin: Skin is warm and dry.  Psychiatric: She has a normal mood and affect. Her behavior is normal. Judgment and thought content normal.  POC Random CBG= 84 POC Hgb A1C= 5.7% (estimated average glucose= 117)  Current Medications:   Current Outpatient Prescriptions  Medication Sig Dispense Refill  . BYDUREON 2 MG SUSR INJECT 2MG  INTO THE SKIN ONCE WEEKLY 12 each 3  . ergocalciferol (VITAMIN D2) 50000 UNITS capsule Take 1 capsule (50,000 Units total) by mouth once a week. One capsule once weekly 12 capsule 1  . fluticasone (FLONASE) 50 MCG/ACT nasal spray Place 2 sprays into both nostrils daily. 48 g 3  . ibuprofen (ADVIL,MOTRIN) 800 MG tablet Take 1 tablet (800 mg total) by mouth 3 (three) times daily. 21 tablet 0  . INVOKANA 300 MG TABS tablet TAKE 1 TABLET BY MOUTH DAILY 90 tablet 3  . metFORMIN (GLUCOPHAGE) 1000 MG tablet TAKE 1 TABLET BY MOUTH TWICE DAILY WITH A MEAL 180 tablet 3  . omeprazole (PRILOSEC) 40 MG capsule TAKE 1 CAPSULE BY MOUTH DAILY 90 capsule PRN  . phentermine 37.5 MG capsule Take 1 capsule (37.5 mg total) by mouth every morning. 30 capsule 4   . iron polysaccharides (NU-IRON) 150 MG capsule Take 1 capsule (150 mg total) by mouth daily. (Patient not taking: Reported on 03/29/2015) 90 capsule 3  . predniSONE (STERAPRED UNI-PAK 21 TAB) 5 MG (21) TBPK tablet Take 1 tablet (5 mg total) by mouth as directed. Use as directed (Patient not taking: Reported on 11/03/2014) 21 tablet 0  . temazepam (RESTORIL) 15 MG capsule Take 1 capsule (15 mg total) by mouth at bedtime as needed for sleep. (Patient not taking: Reported on 11/03/2014) 30 capsule 0   No current facility-administered medications for this visit.    Functional Status:   In your present state of health, do you have any difficulty performing the following activities: 03/28/2015 11/03/2014  Hearing? N N  Vision? N Y  Difficulty concentrating or making decisions? N N  Walking or climbing stairs? - N  Dressing or bathing? N N  Doing errands, shopping? N N    Fall/Depression Screening:    PHQ 2/9 Scores 11/03/2014 07/05/2014 03/02/2013  PHQ - 2 Score 0 0 0  PHQ- 9 Score - 3 -    Assessment:   [employer] employee and Link To Wellness member with morbid obesity and Type II DM with ongoing good glycemic control as evidenced by today's POC Hgb A1C= 5.7%  Plan:  [facility] CM Care Plan Problem One        Most Recent Value   Care Plan Problem One  Type II DM  with good control as evidenced by consistently meeting Hgb A1C target of <7.0%   Role Documenting the Problem One  Care Management Lily for Problem One  Active   THN Long Term Goal (31-90 days)  Ongoing good control of Type II DM as evidenced by meeting target Hgb A1C of <7.0% at each check   THN Long Term Goal Start Date  03/28/15   Interventions for Problem One Long Term Goal  briefly reviewed pathphysiology of Type II DM, reviewed pre and post meal target CBG, encouraged increased CBG checks to identify foods/drinks that elevate CBG above target, encouraged  increased frequency of exercise to improve insulin  sensitivity, will arrange for Link To Wellness follow up  in 6 months      Barrington Ellison RN,CCM,CDE [place] Management Coordinator Link To Wellness Office Phone [phone] Office Fax [phone]

## 2015-03-30 ENCOUNTER — Other Ambulatory Visit: Payer: Self-pay | Admitting: *Deleted

## 2015-04-04 NOTE — Patient Outreach (Signed)
E-mail sent to patient asking if she received prior e-mail with request to schedule Kayla for Link To Wellness follow up to assist her with calculating mealtime insulin. Received reply e-mail from [place] stating she will check her calendar and send this RNCM some dates and times that they can meet. Await Brynli's e-mail response. Barrington Ellison RN,CCM,CDE [place] Management Coordinator Link To Wellness Office Phone [phone] Office Fax [phone]

## 2015-06-01 ENCOUNTER — Other Ambulatory Visit: Payer: Self-pay

## 2015-06-01 ENCOUNTER — Other Ambulatory Visit: Payer: Self-pay | Admitting: Family Medicine

## 2015-06-01 MED ORDER — METFORMIN HCL 1000 MG PO TABS: ORAL_TABLET | ORAL | Status: DC

## 2015-06-01 MED FILL — metFORMIN HCL 1000 MG TABS: 1000 | 90 days supply | Qty: 180 | Fill #0

## 2015-06-07 ENCOUNTER — Other Ambulatory Visit: Payer: Self-pay

## 2015-06-07 MED FILL — BYDUREON 2 MG VIAL: 2 | 84 days supply | Qty: 12 | Fill #0

## 2015-06-16 ENCOUNTER — Other Ambulatory Visit: Payer: Self-pay | Admitting: Family Medicine

## 2015-06-17 ENCOUNTER — Other Ambulatory Visit: Payer: Self-pay | Admitting: Family Medicine

## 2015-06-17 MED FILL — INVOKANA 300 MG TABLET: 300 | 90 days supply | Qty: 90 | Fill #0

## 2015-06-17 MED FILL — OMEPRAZOLE DR 40 MG CAPSULE: 40 | 90 days supply | Qty: 90 | Fill #0

## 2015-06-22 ENCOUNTER — Other Ambulatory Visit: Payer: Self-pay | Admitting: Family Medicine

## 2015-08-15 ENCOUNTER — Ambulatory Visit: Payer: Self-pay | Admitting: *Deleted

## 2015-08-25 MED FILL — BYDUREON 2 MG VIAL: 2 | 84 days supply | Qty: 12 | Fill #1

## 2015-09-07 MED FILL — metFORMIN HCL 1000 MG TABS: 1000 | 90 days supply | Qty: 180 | Fill #1

## 2015-09-12 ENCOUNTER — Other Ambulatory Visit: Payer: Self-pay | Admitting: *Deleted

## 2015-09-12 ENCOUNTER — Ambulatory Visit (INDEPENDENT_AMBULATORY_CARE_PROVIDER_SITE_OTHER): Payer: 59 | Admitting: Family Medicine

## 2015-09-12 ENCOUNTER — Encounter: Payer: Self-pay | Admitting: Family Medicine

## 2015-09-12 VITALS — BP 130/76 | HR 90 | Resp 18 | Ht 62.0 in | Wt 312.0 lb

## 2015-09-12 DIAGNOSIS — Z23 Encounter for immunization: Secondary | ICD-10-CM | POA: Diagnosis not present

## 2015-09-12 DIAGNOSIS — Z13220 Encounter for screening for lipoid disorders: Secondary | ICD-10-CM | POA: Diagnosis not present

## 2015-09-12 DIAGNOSIS — E669 Obesity, unspecified: Secondary | ICD-10-CM | POA: Diagnosis not present

## 2015-09-12 DIAGNOSIS — E1169 Type 2 diabetes mellitus with other specified complication: Secondary | ICD-10-CM

## 2015-09-12 DIAGNOSIS — D509 Iron deficiency anemia, unspecified: Secondary | ICD-10-CM | POA: Diagnosis not present

## 2015-09-12 DIAGNOSIS — E559 Vitamin D deficiency, unspecified: Secondary | ICD-10-CM

## 2015-09-12 DIAGNOSIS — E119 Type 2 diabetes mellitus without complications: Secondary | ICD-10-CM | POA: Diagnosis not present

## 2015-09-12 DIAGNOSIS — G4700 Insomnia, unspecified: Secondary | ICD-10-CM

## 2015-09-12 DIAGNOSIS — E538 Deficiency of other specified B group vitamins: Secondary | ICD-10-CM | POA: Diagnosis not present

## 2015-09-12 MED ORDER — PHENTERMINE HCL 37.5 MG PO TABS: 37.5000 mg | ORAL_TABLET | Freq: Every day | ORAL | Status: DC

## 2015-09-12 MED FILL — PHENTERMINE 37.5 MG TABLET: 37.5 | 60 days supply | Qty: 60 | Fill #0

## 2015-09-12 NOTE — Progress Notes (Signed)
Subjective:    Patient ID: [name], female    DOB: 1975/05/31, 40 y.o.   MRN: [id number]  HPI   [name]     MRN: [id number]      DOB: 01-27-1976   HPI [name] is here for follow up and re-evaluation of chronic medical conditions, medication management and review of any available recent lab and radiology data.  Preventive health is updated, specifically  Cancer screening and Immunization.   Questions or concerns regarding consultations or procedures which the PT has had in the interim are  addressed. The PT denies any adverse reactions to current medications since the last visit.  Denies polyuria, polydipsia, blurred vision , or hypoglycemic episodes. C/o increased personal stress, with weight gain  ROS Denies recent fever or chills. Denies sinus pressure, nasal congestion, ear pain or sore throat. Denies chest congestion, productive cough or wheezing. Denies chest pains, palpitations and leg swelling Denies abdominal pain, nausea, vomiting,diarrhea or constipation.   Denies dysuria, frequency, hesitancy or incontinence. Denies joint pain, swelling and limitation in mobility. Denies headaches, seizures, numbness, or tingling. 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   PE  BP 130/76 mmHg  Pulse 90  Resp 18  Ht 5\' 2"  (1.575 m)  Wt 312 lb (141.522 kg)  BMI 57.05 kg/m2  SpO2 98%  Patient alert and oriented and in no cardiopulmonary distress.  HEENT: No facial asymmetry, EOMI,   oropharynx pink and moist.  Neck supple no JVD, no mass.  Chest: Clear to auscultation bilaterally.  CVS: S1, S2 no murmurs, no S3.Regular rate.  ABD: Soft non tender.   Ext: No edema  MS: Adequate ROM spine, shoulders, hips and knees.  Skin: Intact, no ulcerations or rash noted.  Psych: Good eye contact, normal affect.  Memory intact not anxious or depressed appearing.  CNS: CN 2-12 intact, power,  normal throughout.no focal deficits noted.   Assessment & Plan  Diabetes mellitus type 2 in obese [name] is reminded of the importance of commitment to daily physical activity for 30 minutes or more, as able and the need to limit carbohydrate intake to 30 to 60 grams per meal to help with blood sugar control.   The need to take medication as prescribed, test blood sugar as directed, and to call between visits if there is a concern that blood sugar is uncontrolled is also discussed.   [name] is reminded of the importance of daily foot exam, annual eye examination, and good blood sugar, blood pressure and cholesterol control. deteriorated  Diabetic Labs Latest Ref Rng 09/12/2015 03/28/2015 07/28/2014 03/16/2014 03/08/2014  HbA1c <5.7 % 6.0(H) 5.7 6.2(H) - 6.0(H)  Microalbumin Not estab mg/dL 0.4 - - 0.5 -  Micro/Creat Ratio <30 mcg/mg creat 3 - - 3.9 -  Chol 125 - 200 mg/dL 150 - - - 137  HDL >=46 mg/dL 64 - - - 56  Calc LDL <130 mg/dL 74 - - - 74  Triglycerides <150 mg/dL 58 - - - 37  Creatinine 0.50 - 1.10 mg/dL 0.75 - 0.59 - 0.66   BP/Weight 09/12/2015 09/12/2015 11/03/2014 10/05/2014 07/05/2014 06/11/2014 [id number]  Systolic BP - [id number]  Diastolic BP - 76 60 64 64 66 80  Wt. (Lbs) - 312 285 288 296 288 283  BMI - 57.05 52.11 52.66 54.13 52.66 51.75   Foot/eye exam completion dates Latest Ref Rng 07/05/2014 03/11/2014  Eye Exam No Retinopathy - No Retinopathy  Foot Form Completion - Done -         Morbid obesity (HCC) Deteriorated.start phentermine, and improve stress management Patient re-educated about  the importance of commitment to a  minimum of 150 minutes of exercise per week.  The importance of healthy food choices with portion control discussed. Encouraged to start a food diary, count calories and to consider  joining a support group. Sample diet sheets offered. Goals  set by the patient for the next several months.   Weight /BMI 09/12/2015 09/12/2015 11/03/2014  WEIGHT - 312 lb 285 lb  HEIGHT - 5\' 2"  5\' 2"   BMI - 57.05 kg/m2 52.11 kg/m2    Current exercise per week 60 minutes.   Vitamin D deficiency Needs to commit to weekly vit D Updated lab needed at/ before next visit.   Insomnia deteriorated with increased stress Sleep hygiene reviewed and written information offered also. Prescription sent for  medication needed.      Review of Systems     Objective:   Physical Exam        Assessment & Plan:

## 2015-09-12 NOTE — Patient Outreach (Signed)
[facility]) Care Management   09/12/2015  [name] 16-Jul-1975 [id number]  [name] is an 40 y.o. female who presents to the [place] Management office for routine Link To Wellness follow up for self management assistance with Type II DM.  Subjective:  [name] states she just saw Dr. Moshe Cipro today and her weight was up (312 lbs) so she was advised to restart phentermine. She has no complaints and says she rarely checks her blood sugar but when she does it is normal. She says she had many labs drawn today including a Hgb A1C.  Objective:   Review of Systems  Constitutional: Negative.     Physical Exam  Constitutional: She is oriented to person, place, and time. She appears well-developed and well-nourished.  Respiratory: Effort normal.  Neurological: She is alert and oriented to person, place, and time.  Skin: Skin is warm and dry.  Psychiatric: She has a normal mood and affect. Her behavior is normal. Judgment and thought content normal.    Encounter Medications:   Outpatient Encounter Prescriptions as of 09/12/2015  Medication Sig Note  . BYDUREON 2 MG SUSR INJECT 2MG  INTO THE SKIN ONCE WEEKLY   . fluticasone (FLONASE) 50 MCG/ACT nasal spray Place 2 sprays into both nostrils daily. 03/29/2015: Takes prn  . ibuprofen (ADVIL,MOTRIN) 800 MG tablet Take 1 tablet (800 mg total) by mouth 3 (three) times daily.   . INVOKANA 300 MG TABS tablet TAKE 1 TABLET BY MOUTH DAILY   . metFORMIN (GLUCOPHAGE) 1000 MG tablet TAKE 1 TABLET BY MOUTH TWICE DAILY WITH A MEAL   . omeprazole (PRILOSEC) 40 MG capsule TAKE 1 CAPSULE BY MOUTH DAILY   . phentermine (ADIPEX-P) 37.5 MG tablet Take 1 tablet (37.5 mg total) by mouth daily before breakfast.   . temazepam (RESTORIL) 15 MG capsule Take 1 capsule (15 mg total) by mouth at bedtime as needed for sleep.   . ergocalciferol (VITAMIN D2) 50000 UNITS capsule Take 1 capsule (50,000 Units total) by  mouth once a week. One capsule once weekly (Patient not taking: Reported on 09/12/2015)   . iron polysaccharides (NU-IRON) 150 MG capsule Take 1 capsule (150 mg total) by mouth daily. (Patient not taking: Reported on 09/12/2015)    No facility-administered encounter medications on file as of 09/12/2015.    Functional Status:   In your present state of health, do you have any difficulty performing the following activities: 09/12/2015 03/28/2015  Hearing? N N  Vision? N N  Difficulty concentrating or making decisions? N N  Walking or climbing stairs? N -  Dressing or bathing? N N  Doing errands, shopping? N N    Fall/Depression Screening:    PHQ 2/9 Scores 09/12/2015 11/03/2014 07/05/2014 03/02/2013  PHQ - 2 Score 0 0 0 0  PHQ- 9 Score - - 3 -    Assessment:   [employer] employee and Link To Wellness member with morbid obesity and Type II DM with ongoing good glycemic control as evidenced by today's Hgb A1C=6.0% at Dr. Griffin Dakin office, lipid panel and iron studies normal, Vit D low, B12 level low normal.  Plan:   [facility] CM Care Plan Problem One        Most Recent Value   Care Plan Problem One  Morbid obesity and Type II DM with good control as evidenced by consistently meeting Hgb A1C target of <7.0%, weight increasing- to restart phentermine   Role Documenting the Problem One  Care Management Coordinator   Care Plan for Problem One  Active   THN Long Term Goal (31-90 days)  Ongoing good control of Type II DM as evidenced by meeting target Hgb A1C of <7.0% at each check and no weight gain or weight loss at next assessment   THN Long Term Goal Start Date  03/28/15   Interventions for Problem One Long Term Goal  briefly reviewed pathophysiology of Type II DM, reviewed pre and post meal target CBG, encouraged increased CBG checks to identify foods/drinks that elevate CBG above target, encouraged increased frequency of exercise to improve insulin sensitivity, reviewed nutritional counseling  benefit offered by [place] to help with weight loss, will arrange for Link To Wellness follow up  in 6 months      RNCM to fax today's office visit note to Dr. Moshe Cipro. RNCM will meet quarterly and as needed with patient per Link To Wellness program guidelines to assist with Type II DM and obesity self-management and assess patient's progress toward mutually set goals.  Barrington Ellison RN,CCM,CDE [place] Management Coordinator Link To Wellness Office Phone [phone] Office Fax [phone]

## 2015-09-12 NOTE — Patient Instructions (Signed)
F/u in 4 months, call if you need em sooner  Labs today  Foot exam is good  MAKE time for yourself and family  Pneumonia 23 today  Weight loss goal of 5 to 7 pounds per month, NEED to eart REGULARLY, and change choices  Please work on good  health habits so that your health will improve. 1. Commitment to daily physical activity for 30 to 60  minutes, if you are able to do this.  2. Commitment to wise food choices. Aim for half of your  food intake to be vegetable and fruit, one quarter starchy foods, and one quarter protein. Try to eat on a regular schedule  3 meals per day, snacking between meals should be limited to vegetables or fruits or small portions of nuts. 64 ounces of water per day is generally recommended, unless you have specific health conditions, like heart failure or kidney failure where you will need to limit fluid intake.  3. Commitment to sufficient and a  good quality of physical and mental rest daily, generally between 6 to 8 hours per day.  WITH PERSISTANCE AND PERSEVERANCE, THE IMPOSSIBLE , BECOMES THE NORM!  Thanks for choosing [facility], we consider it a privelige to serve you.

## 2015-09-13 ENCOUNTER — Encounter: Payer: Self-pay | Admitting: Family Medicine

## 2015-09-13 LAB — LIPID PANEL
CHOLESTEROL: 150 mg/dL (ref 125–200)
HDL: 64 mg/dL (ref >=46)
LDL Cholesterol: 74 mg/dL (ref <130)
Total CHOL/HDL Ratio: 2.3 Ratio (ref <=5.0)
Triglycerides: 58 mg/dL (ref <150)
VLDL: 12 mg/dL (ref <30)

## 2015-09-13 LAB — COMPLETE METABOLIC PANEL WITH GFR
ALBUMIN: 3.8 g/dL (ref 3.6–5.1)
ALK PHOS: 56 U/L (ref 33–115)
ALT: 11 U/L (ref 6–29)
AST: 13 U/L (ref 10–30)
BUN: 13 mg/dL (ref 7–25)
CALCIUM: 8.8 mg/dL (ref 8.6–10.2)
CHLORIDE: 104 mmol/L (ref 98–110)
CO2: 26 mmol/L (ref 20–31)
CREATININE: 0.75 mg/dL (ref 0.50–1.10)
GFR, Est African American: >89 mL/min (ref >=60)
GFR, Est Non African American: >89 mL/min (ref >=60)
Glucose, Bld: 105 mg/dL — ABNORMAL HIGH (ref 65–99)
Potassium: 4.1 mmol/L (ref 3.5–5.3)
Sodium: 138 mmol/L (ref 135–146)
TOTAL PROTEIN: 6.3 g/dL (ref 6.1–8.1)
Total Bilirubin: 0.4 mg/dL (ref 0.2–1.2)

## 2015-09-13 LAB — CBC
HCT: 40 % (ref 35.0–45.0)
Hemoglobin: 12.5 g/dL (ref 11.7–15.5)
MCH: 27.3 pg (ref 27.0–33.0)
MCHC: 31.3 g/dL — ABNORMAL LOW (ref 32.0–36.0)
MCV: 87.3 fL (ref 80.0–100.0)
MPV: 10 fL (ref 7.5–12.5)
PLATELETS: 271 10*3/uL (ref 140–400)
RBC: 4.58 MIL/uL (ref 3.80–5.10)
RDW: 14.2 % (ref 11.0–15.0)
WBC: 5.2 10*3/uL (ref 3.8–10.8)

## 2015-09-13 LAB — IRON AND TIBC
%SAT: 24 % (ref 11–50)
Iron: 64 ug/dL (ref 40–190)
TIBC: 268 ug/dL (ref 250–450)
UIBC: 204 ug/dL (ref 125–400)

## 2015-09-13 LAB — VITAMIN B12: Vitamin B-12: 214 pg/mL (ref 200–1100)

## 2015-09-13 LAB — FOLATE: FOLATE: 12.3 ng/mL (ref >5.4)

## 2015-09-13 LAB — MICROALBUMIN / CREATININE URINE RATIO
CREATININE, URINE: 119 mg/dL (ref 20–320)
MICROALB UR: 0.4 mg/dL
MICROALB/CREAT RATIO: 3 ug/mg{creat} (ref <30)

## 2015-09-13 LAB — VITAMIN D 25 HYDROXY (VIT D DEFICIENCY, FRACTURES): Vit D, 25-Hydroxy: 9 ng/mL — ABNORMAL LOW (ref 30–100)

## 2015-09-13 LAB — FERRITIN: Ferritin: 17 ng/mL (ref 10–232)

## 2015-09-13 LAB — HEMOGLOBIN A1C
HEMOGLOBIN A1C: 6 % — AB (ref <5.7)
Mean Plasma Glucose: 126 mg/dL

## 2015-09-13 LAB — TSH: TSH: 1.97 mIU/L

## 2015-09-18 NOTE — Assessment & Plan Note (Signed)
Deteriorated.start phentermine, and improve stress management Patient re-educated about  the importance of commitment to a  minimum of 150 minutes of exercise per week.  The importance of healthy food choices with portion control discussed. Encouraged to start a food diary, count calories and to consider  joining a support group. Sample diet sheets offered. Goals set by the patient for the next several months.   Weight /BMI 09/12/2015 09/12/2015 11/03/2014  WEIGHT - 312 lb 285 lb  HEIGHT - 5\' 2"  5\' 2"   BMI - 57.05 kg/m2 52.11 kg/m2    Current exercise per week 60 minutes.

## 2015-09-18 NOTE — Assessment & Plan Note (Signed)
Needs to commit to weekly vit D Updated lab needed at/ before next visit.

## 2015-09-18 NOTE — Assessment & Plan Note (Signed)
[name] is reminded of the importance of commitment to daily physical activity for 30 minutes or more, as able and the need to limit carbohydrate intake to 30 to 60 grams per meal to help with blood sugar control.   The need to take medication as prescribed, test blood sugar as directed, and to call between visits if there is a concern that blood sugar is uncontrolled is also discussed.   [name] is reminded of the importance of daily foot exam, annual eye examination, and good blood sugar, blood pressure and cholesterol control. deteriorated  Diabetic Labs Latest Ref Rng 09/12/2015 03/28/2015 07/28/2014 03/16/2014 03/08/2014  HbA1c <5.7 % 6.0(H) 5.7 6.2(H) - 6.0(H)  Microalbumin Not estab mg/dL 0.4 - - 0.5 -  Micro/Creat Ratio <30 mcg/mg creat 3 - - 3.9 -  Chol 125 - 200 mg/dL 150 - - - 137  HDL >=46 mg/dL 64 - - - 56  Calc LDL <130 mg/dL 74 - - - 74  Triglycerides <150 mg/dL 58 - - - 37  Creatinine 0.50 - 1.10 mg/dL 0.75 - 0.59 - 0.66   BP/Weight 09/12/2015 09/12/2015 11/03/2014 10/05/2014 07/05/2014 06/11/2014 [id number]  Systolic BP - [id number]  Diastolic BP - 76 60 64 64 66 80  Wt. (Lbs) - 312 285 288 296 288 283  BMI - 57.05 52.11 52.66 54.13 52.66 51.75   Foot/eye exam completion dates Latest Ref Rng 07/05/2014 03/11/2014  Eye Exam No Retinopathy - No Retinopathy  Foot Form Completion - Done -

## 2015-09-18 NOTE — Assessment & Plan Note (Signed)
deteriorated with increased stress Sleep hygiene reviewed and written information offered also. Prescription sent for  medication needed.

## 2015-09-20 MED FILL — INVOKANA 300 MG TABLET: 300 | 90 days supply | Qty: 90 | Fill #1

## 2015-09-20 MED FILL — OMEPRAZOLE DR 40 MG CAPSULE: 40 | 90 days supply | Qty: 90 | Fill #1

## 2015-10-01 ENCOUNTER — Telehealth: Payer: Self-pay | Admitting: Family Medicine

## 2015-10-01 NOTE — Telephone Encounter (Signed)
Pls call pt with her lab report I sent an e mail,nort readn she needs to take med

## 2015-10-03 NOTE — Telephone Encounter (Signed)
Pt aware.

## 2015-12-12 MED FILL — BYDUREON 2 MG VIAL: 2 | 84 days supply | Qty: 12 | Fill #2

## 2015-12-12 MED FILL — DICLOFENAC SODIUM 1% GEL: 1 | 10 days supply | Qty: 300 | Fill #1

## 2015-12-27 MED FILL — metFORMIN HCL 1000 MG TABS: 1000 | 90 days supply | Qty: 180 | Fill #2

## 2015-12-28 ENCOUNTER — Other Ambulatory Visit: Payer: Self-pay

## 2015-12-28 MED ORDER — OMEPRAZOLE 40 MG PO CPDR: 40.0000 mg | DELAYED_RELEASE_CAPSULE | Freq: Every day | ORAL | 1 refills | Status: DC

## 2015-12-28 MED ORDER — CANAGLIFLOZIN 300 MG PO TABS: 300.0000 mg | ORAL_TABLET | Freq: Every day | ORAL | 1 refills | Status: DC

## 2015-12-28 MED FILL — OMEPRAZOLE DR 40 MG CAPSULE: 40 | 90 days supply | Qty: 90 | Fill #0

## 2015-12-28 MED FILL — INVOKANA 300 MG TABLET: 300 | 90 days supply | Qty: 90 | Fill #0

## 2015-12-30 DIAGNOSIS — E669 Obesity, unspecified: Secondary | ICD-10-CM | POA: Diagnosis not present

## 2016-01-04 ENCOUNTER — Other Ambulatory Visit (HOSPITAL_COMMUNITY): Payer: Self-pay | Admitting: Surgery

## 2016-01-04 DIAGNOSIS — E669 Obesity, unspecified: Secondary | ICD-10-CM

## 2016-01-17 ENCOUNTER — Encounter: Payer: 59 | Attending: Surgery | Admitting: Skilled Nursing Facility1

## 2016-01-17 ENCOUNTER — Encounter: Payer: Self-pay | Admitting: Skilled Nursing Facility1

## 2016-01-17 DIAGNOSIS — Z713 Dietary counseling and surveillance: Secondary | ICD-10-CM | POA: Insufficient documentation

## 2016-01-17 NOTE — Progress Notes (Signed)
  Pre-Op Assessment Visit:  Pre-Operative Sleeve Gastrectomy Surgery  Medical Nutrition Therapy:  Appt start time: 8:10   End time: 9:00  Patient was seen on 01/17/2016 for Pre-Operative Nutrition Assessment. Assessment and letter of approval faxed to [facility] Surgery Bariatric Surgery Program coordinator on 01/17/2016.   Pts A1C 6.0.   Preferred Learning Style:   No preference indicated   Learning Readiness:   Ready  Handouts given during visit include:  Pre-Op Goals Bariatric Surgery Protein Shakes   During the appointment today the following Pre-Op Goals were reviewed with the patient: Maintain or lose weight as instructed by your surgeon Make healthy food choices Begin to limit portion sizes Limited concentrated sugars and fried foods Keep fat/sugar in the single digits per serving on   food labels Practice CHEWING your food  (aim for 30 chews per bite or until applesauce consistency) Practice not drinking 15 minutes before, during, and 30 minutes after each meal/snack Avoid all carbonated beverages  Avoid/limit caffeinated beverages  Avoid all sugar-sweetened beverages Consume 3 meals per day; eat every 3-5 hours Make a list of non-food related activities Aim for 64-100 ounces of FLUID daily  Aim for at least 60-80 grams of PROTEIN daily Look for a liquid protein source that contain ?15 g protein and ?5 g carbohydrate  (ex: shakes, drinks, shots)  Patient-Centered Goals: 10/10 specific/non-scale and confidence/importance scale 1-10  Demonstrated degree of understanding via:  Teach Back  Teaching Method Utilized:  Visual Auditory Hands on  Barriers to learning/adherence to lifestyle change: none identified  Patient to call the [facility] to enroll in Pre-Op and Post-Op Nutrition Education when surgery date is scheduled.

## 2016-01-19 ENCOUNTER — Other Ambulatory Visit: Payer: Self-pay

## 2016-01-19 ENCOUNTER — Ambulatory Visit (HOSPITAL_COMMUNITY)
Admission: RE | Admit: 2016-01-19 | Discharge: 2016-01-19 | Disposition: A | Discharge status: Home / Self Care | Payer: 59 | Source: Ambulatory Visit | Attending: Surgery | Admitting: Surgery

## 2016-01-19 ENCOUNTER — Encounter (HOSPITAL_COMMUNITY): Payer: Self-pay

## 2016-01-19 DIAGNOSIS — Z01818 Encounter for other preprocedural examination: Secondary | ICD-10-CM | POA: Insufficient documentation

## 2016-01-19 DIAGNOSIS — E669 Obesity, unspecified: Secondary | ICD-10-CM

## 2016-01-19 LAB — HEMOGLOBIN A1C: HEMOGLOBIN A1C: 5.9

## 2016-01-19 LAB — PROTIME-INR: INR: 0.9 (ref 0.9–1.1)

## 2016-01-19 LAB — LIPID PANEL: LDL Cholesterol: 83 mg/dL

## 2016-01-20 ENCOUNTER — Ambulatory Visit: Payer: 59 | Admitting: Family Medicine

## 2016-01-26 ENCOUNTER — Ambulatory Visit: Payer: 59 | Admitting: Family Medicine

## 2016-02-09 ENCOUNTER — Ambulatory Visit (INDEPENDENT_AMBULATORY_CARE_PROVIDER_SITE_OTHER): Payer: 59 | Admitting: Psychiatry

## 2016-02-13 ENCOUNTER — Ambulatory Visit (INDEPENDENT_AMBULATORY_CARE_PROVIDER_SITE_OTHER): Payer: 59 | Admitting: Family Medicine

## 2016-02-13 ENCOUNTER — Encounter: Payer: Self-pay | Admitting: Family Medicine

## 2016-02-13 VITALS — BP 114/68 | HR 73 | Ht 62.0 in | Wt 324.0 lb

## 2016-02-13 DIAGNOSIS — E669 Obesity, unspecified: Secondary | ICD-10-CM | POA: Diagnosis not present

## 2016-02-13 DIAGNOSIS — K219 Gastro-esophageal reflux disease without esophagitis: Secondary | ICD-10-CM

## 2016-02-13 DIAGNOSIS — E1169 Type 2 diabetes mellitus with other specified complication: Secondary | ICD-10-CM

## 2016-02-13 NOTE — Patient Instructions (Addendum)
F/u in 4 month, call if you needme before  ALL the bEST with upcoming surgery which will positively impact your health and wellbeing  PLEASE schedule mammogram  Will send you a message on your labs as soon as I am able to retrieve  It is important that you exercise regularly at least 30 minutes 5 times a week. If you develop chest pain, have severe difficulty breathing, or feel very tired, stop exercising immediately and seek medical attention   Please keep appt for eye exam Thank you  for choosing [facility]. We consider it a privelige to serve you.  Delivering excellent health care in a caring and  compassionate way is our goal.  Partnering with you,  so that together we can achieve this goal is our strategy.

## 2016-02-13 NOTE — Assessment & Plan Note (Signed)
Controlled, no change in medication  

## 2016-02-13 NOTE — Assessment & Plan Note (Signed)
Updated lab needed at/ before next visit. [name] is reminded of the importance of commitment to daily physical activity for 30 minutes or more, as able and the need to limit carbohydrate intake to 30 to 60 grams per meal to help with blood sugar control.   The need to take medication as prescribed, test blood sugar as directed, and to call between visits if there is a concern that blood sugar is uncontrolled is also discussed.   [name] is reminded of the importance of daily foot exam, annual eye examination, and good blood sugar, blood pressure and cholesterol control.  Diabetic Labs Latest Ref Rng & Units 09/12/2015 03/28/2015 07/28/2014 03/16/2014 03/08/2014  HbA1c <5.7 % 6.0(H) 5.7 6.2(H) - 6.0(H)  Microalbumin Not estab mg/dL 0.4 - - 0.5 -  Micro/Creat Ratio <30 mcg/mg creat 3 - - 3.9 -  Chol 125 - 200 mg/dL 150 - - - 137  HDL >=46 mg/dL 64 - - - 56  Calc LDL <130 mg/dL 74 - - - 74  Triglycerides <150 mg/dL 58 - - - 37  Creatinine 0.50 - 1.10 mg/dL 0.75 - 0.59 - 0.66   BP/Weight 02/13/2016 01/17/2016 09/12/2015 09/12/2015 11/03/2014 10/05/2014 [id number]  Systolic BP [id number] - - [id number]  Diastolic BP 68 - - 76 60 64 64  Wt. (Lbs) 324 324.9 - 312 285 288 296  BMI 59.26 59.42 - 57.05 52.11 52.66 54.13   Foot/eye exam completion dates Latest Ref Rng & Units 09/12/2015 07/05/2014  Eye Exam No Retinopathy - -  Foot Form Completion - Done Done

## 2016-02-13 NOTE — Progress Notes (Signed)
[name]     MRN: [id number]      DOB: 12/19/1975   HPI [name] is here for follow up and re-evaluation of chronic medical conditions, medication management and review of any available recent lab and radiology data.  Preventive health is updated, specifically  Cancer screening and Immunization.   Questions or concerns regarding consultations or procedures which the PT has had in the interim are  Addressed.currently enrolled for gastric bypass due to excess weight and regaining weight The PT denies any adverse reactions to current medications since the last visit.  There are no new concerns.  There are no specific complaints   ROS Denies recent fever or chills. Denies sinus pressure, nasal congestion, ear pain or sore throat. Denies chest congestion, productive cough or wheezing. Denies chest pains, palpitations and leg swelling Denies abdominal pain, nausea, vomiting,diarrhea or constipation.   Denies dysuria, frequency, hesitancy or incontinence. Denies joint pain, swelling and limitation in mobility. Denies headaches, seizures, numbness, or tingling. Denies depression, uncontrolled anxiety or insomnia. Denies skin break down or rash. Denies polyuria, polydipsia, blurred vision , or hypoglycemic episodes.   PE  BP 114/68   Pulse 73   Ht 5\' 2"  (1.575 m)   Wt (!) 324 lb (147 kg)   LMP  (LMP Unknown)   SpO2 97%   BMI 59.26 kg/m   Patient alert and oriented and in no cardiopulmonary distress.  HEENT: No facial asymmetry, EOMI,   oropharynx pink and moist.  Neck supple no JVD, no mass.  Chest: Clear to auscultation bilaterally.  CVS: S1, S2 no murmurs, no S3.Regular rate.  ABD: Soft non tender.   Ext: No edema  MS: Adequate ROM spine, shoulders, hips and knees.  Skin: Intact, no ulcerations or rash noted.  Psych: Good eye contact, normal affect. Memory intact not anxious or depressed appearing.  CNS: CN 2-12 intact, power,  normal throughout.no focal  deficits noted.   Assessment & Plan  Diabetes mellitus type 2 in obese Updated lab needed at/ before next visit. [name] is reminded of the importance of commitment to daily physical activity for 30 minutes or more, as able and the need to limit carbohydrate intake to 30 to 60 grams per meal to help with blood sugar control.   The need to take medication as prescribed, test blood sugar as directed, and to call between visits if there is a concern that blood sugar is uncontrolled is also discussed.   [name] is reminded of the importance of daily foot exam, annual eye examination, and good blood sugar, blood pressure and cholesterol control.  Diabetic Labs Latest Ref Rng & Units 09/12/2015 03/28/2015 07/28/2014 03/16/2014 03/08/2014  HbA1c <5.7 % 6.0(H) 5.7 6.2(H) - 6.0(H)  Microalbumin Not estab mg/dL 0.4 - - 0.5 -  Micro/Creat Ratio <30 mcg/mg creat 3 - - 3.9 -  Chol 125 - 200 mg/dL 150 - - - 137  HDL >=46 mg/dL 64 - - - 56  Calc LDL <130 mg/dL 74 - - - 74  Triglycerides <150 mg/dL 58 - - - 37  Creatinine 0.50 - 1.10 mg/dL 0.75 - 0.59 - 0.66   BP/Weight 02/13/2016 01/17/2016 09/12/2015 09/12/2015 11/03/2014 10/05/2014 [id number]  Systolic BP [id number] - - [id number]  Diastolic BP 68 - - 76 60 64 64  Wt. (Lbs) 324 324.9 - 312 285 288 296  BMI 59.26 59.42 - 57.05 52.11 52.66 54.13   Foot/eye exam completion dates Latest Ref Rng &  Units 09/12/2015 07/05/2014  Eye Exam No Retinopathy - -  Foot Form Completion - Done Done        Morbid obesity (HCC) Deteriorated. Patient re-educated about  the importance of commitment to a  minimum of 150 minutes of exercise per week.  The importance of healthy food choices with portion control discussed. Encouraged to start a food diary, count calories and to consider  joining a support group. Sample diet sheets offered. Goals set by the patient for the next several months.   Weight /BMI 02/13/2016 01/17/2016 09/12/2015  WEIGHT 324 lb 324 lb 14.4 oz  -  HEIGHT 5\' 2"  5\' 2"  -  BMI 59.26 kg/m2 59.42 kg/m2 -      GERD Controlled, no change in medication   ALLERGIC RHINITIS, SEASONAL Controlled, no change in medication

## 2016-02-13 NOTE — Assessment & Plan Note (Signed)
Deteriorated. Patient re-educated about  the importance of commitment to a  minimum of 150 minutes of exercise per week.  The importance of healthy food choices with portion control discussed. Encouraged to start a food diary, count calories and to consider  joining a support group. Sample diet sheets offered. Goals set by the patient for the next several months.   Weight /BMI 02/13/2016 01/17/2016 09/12/2015  WEIGHT 324 lb 324 lb 14.4 oz -  HEIGHT 5\' 2"  5\' 2"  -  BMI 59.26 kg/m2 59.42 kg/m2 -

## 2016-02-14 ENCOUNTER — Encounter: Payer: Self-pay | Admitting: Family Medicine

## 2016-02-23 ENCOUNTER — Ambulatory Visit (INDEPENDENT_AMBULATORY_CARE_PROVIDER_SITE_OTHER): Payer: 59 | Admitting: Psychiatry

## 2016-02-27 ENCOUNTER — Ambulatory Visit: Payer: Self-pay | Admitting: *Deleted

## 2016-02-29 ENCOUNTER — Ambulatory Visit: Payer: Self-pay | Admitting: *Deleted

## 2016-03-07 NOTE — Progress Notes (Signed)
Scheduling pre op- please place SURGICAL ORDERS IN EPIC  Thanks 

## 2016-03-08 DIAGNOSIS — E669 Obesity, unspecified: Secondary | ICD-10-CM | POA: Diagnosis not present

## 2016-03-08 MED FILL — ONDANSETRON ODT 4 MG TABLET: 4 | 5 days supply | Qty: 20 | Fill #0

## 2016-03-08 MED FILL — PANTOPRAZOLE SOD DR 40 MG T: 40 | 30 days supply | Qty: 30 | Fill #0

## 2016-03-12 MED FILL — BYDUREON 2 MG VIAL: 2 | 84 days supply | Qty: 12 | Fill #3

## 2016-03-16 DIAGNOSIS — H5213 Myopia, bilateral: Secondary | ICD-10-CM | POA: Diagnosis not present

## 2016-03-21 ENCOUNTER — Encounter: Payer: 59 | Attending: Surgery | Admitting: Dietician

## 2016-03-21 DIAGNOSIS — Z713 Dietary counseling and surveillance: Secondary | ICD-10-CM | POA: Insufficient documentation

## 2016-03-22 ENCOUNTER — Encounter: Payer: Self-pay | Admitting: Dietician

## 2016-03-22 NOTE — Progress Notes (Signed)
  Pre-Operative Nutrition Class:  Appt start time: 440   End time:  615  Patient was seen on 03/21/2016 for Pre-Operative Bariatric Surgery Education at the [facility].   Surgery date: 04/02/2016 Surgery type: Sleeve gastrectomy Start weight at [facility]: 325 lbs on 01/17/2016 Weight today: 316.8 lbs  TANITA  BODY COMP RESULTS  03/21/16   BMI (kg/m^2) 57.9   Fat Mass (lbs) 175.8   Fat Free Mass (lbs) 141   Total Body Water (lbs) 105.8   Samples given per MNT protocol. Patient educated on appropriate usage: Premier protein shake (vanilla - qty 1) Lot #: [id number] Exp: 01/2017  Celebrate Calcium Citrate chew (cherry - qty 1) Lot #: [id number] Exp: 06/2017  Bariatric Advantage Vitamins Multivitamin (mixed fruit - qty 1) Lot #: [id number] Exp: 02/2017  Unjury Protein Powder (chicken soup - qty 1) Lot #: [id number] Exp: 06/2017  The following the learning objectives were met by the patient during this course:  Identify Pre-Op Dietary Goals and will begin 2 weeks pre-operatively  Identify appropriate sources of fluids and proteins   State protein recommendations and appropriate sources pre and post-operatively  Identify Post-Operative Dietary Goals and will follow for 2 weeks post-operatively  Identify appropriate multivitamin and calcium sources  Describe the need for physical activity post-operatively and will follow MD recommendations  State when to call healthcare provider regarding medication questions or post-operative complications  Handouts given during class include:  Pre-Op Bariatric Surgery Diet Handout  Protein Shake Handout  Post-Op Bariatric Surgery Nutrition Handout  BELT Program Information Flyer  Support Group Information Flyer  [facility] Bariatric Supplements Price List  Follow-Up Plan: Patient will follow-up at [facility] 2 weeks post operatively for diet advancement per MD.

## 2016-03-23 NOTE — Progress Notes (Signed)
Surgery on 04/02/16.  preop on 03/28/16.  Need orders in EPIC.  Thank You.

## 2016-03-26 ENCOUNTER — Other Ambulatory Visit: Payer: Self-pay | Admitting: *Deleted

## 2016-03-26 MED FILL — INVOKANA 300 MG TABLET: 300 | 90 days supply | Qty: 90 | Fill #1

## 2016-03-26 NOTE — Patient Outreach (Signed)
[facility]) Care Management   03/26/2016  [name] December 28, 1975 [id number]  [name] is an 40 y.o. female who presents to the [place] Management office with her daughter [name] (who is also a member of the program) for routine Link To Wellness follow up for self management assistance with Type II DM and morbid obesity.  Subjective:  [name] states she will have gastric sleeve surgery on 04/02/16 and has met with the registered dietician and is following the presurgical meal plan. She has no complaints and says she rarely checks her blood sugar but when she does it is normal. She says she had many labs drawn on 9/28 including a Hgb A1C that was 5.9 %. She says she has enrolled in [employer] adjust[employer] disease management program that will replace Link To Wellness beginning in 2018.  Objective:   Review of Systems  Constitutional: Negative.     Physical Exam  Constitutional: She is oriented to person, place, and time. She appears well-developed and well-nourished.  Respiratory: Effort normal.  Neurological: She is alert and oriented to person, place, and time.  Skin: Skin is warm and dry.  Psychiatric: She has a normal mood and affect. Her behavior is normal. Judgment and thought content normal.    Encounter Medications:   Outpatient Encounter Prescriptions as of 09/12/2015  Medication Sig Note  . BYDUREON 2 MG SUSR INJECT 2MG INTO THE SKIN ONCE WEEKLY   . fluticasone (FLONASE) 50 MCG/ACT nasal spray Place 2 sprays into both nostrils daily. 03/29/2015: Takes prn  . ibuprofen (ADVIL,MOTRIN) 800 MG tablet Take 1 tablet (800 mg total) by mouth 3 (three) times daily.   . INVOKANA 300 MG TABS tablet TAKE 1 TABLET BY MOUTH DAILY   . metFORMIN (GLUCOPHAGE) 1000 MG tablet TAKE 1 TABLET BY MOUTH TWICE DAILY WITH A MEAL   . omeprazole (PRILOSEC) 40 MG capsule TAKE 1 CAPSULE BY MOUTH DAILY   . phentermine (ADIPEX-P) 37.5 MG  tablet Take 1 tablet (37.5 mg total) by mouth daily before breakfast.   . temazepam (RESTORIL) 15 MG capsule Take 1 capsule (15 mg total) by mouth at bedtime as needed for sleep.   . ergocalciferol (VITAMIN D2) 50000 UNITS capsule Take 1 capsule (50,000 Units total) by mouth once a week. One capsule once weekly (Patient not taking: Reported on 09/12/2015)   . iron polysaccharides (NU-IRON) 150 MG capsule Take 1 capsule (150 mg total) by mouth daily. (Patient not taking: Reported on 09/12/2015)    No facility-administered encounter medications on file as of 09/12/2015.    Functional Status:   In your present state of health, do you have any difficulty performing the following activities: 09/12/2015 03/28/2015  Hearing? N N  Vision? N N  Difficulty concentrating or making decisions? N N  Walking or climbing stairs? N -  Dressing or bathing? N N  Doing errands, shopping? N N  Some recent data might be hidden    Fall/Depression Screening:    PHQ 2/9 Scores 03/22/2016 09/12/2015 11/03/2014 07/05/2014 03/02/2013  PHQ - 2 Score 0 0 0 0 0  PHQ- 9 Score - - - 3 -    Assessment:   [employer] employee and Link To Wellness member with morbid obesity and Type II DM with ongoing good glycemic control as evidenced by  Hgb A1C 5.9% on 01/19/16 and with morbid obesity with current body mass index = 57.94  Plan:   [place]  Problem One        Most Recent Value   Care Plan Problem One  Morbid obesity and Type II DM with good control as evidenced by consistently meeting Hgb A1C target of <7.0%, morbidly obese- to have gastric sleeve surgery on 04/02/16   Role Documenting the Problem One  Care Management [place] for Problem One  Active   THN Long Term Goal (31-90 days)  Remission of Type II DM as evidenced by meeting target Hgb A1C of <5.7% at next post -op assessment, uneventful post op recovery with weight loss as expected and no hospital readmission prior to 30 days post discharge    [place] Term Goal Start Date  03/26/16    Interventions for Problem One Long Term Goal  Discussed upcoming gastric sleeve surgery, advised her this RNCM will call her for post op transition of care, reviewed changes to [place] that will impact [place] ([place]) and [employer]) and advised her to speak with the prescribing providers for options that are covered under the disease management program at no cost,  provided handout with free medication list for the disease management program in 2018, discussed [place] and provided information brochure, ensured that Juletta has enrolled in [place], advised her disease management follow up will occur via [place] beginning in January 2018     RNCM to fax today's office visit note to Dr. Moshe Cipro.  Barrington Ellison RN,CCM,CDE [place] Management Coordinator Link To Wellness Office Phone [phone] Office Fax [phone]

## 2016-03-27 ENCOUNTER — Ambulatory Visit: Payer: Self-pay | Admitting: Surgery

## 2016-03-27 NOTE — Patient Instructions (Signed)
[name]  03/27/2016   Your procedure is scheduled on: 04/02/16    Report to [facility] Main  Entrance take [facility]  elevators to 3rd floor to  [place] at    0900 AM.  Call this number if you have problems the morning of surgery [phone]   Remember: ONLY 1 PERSON MAY GO WITH YOU TO SHORT STAY TO GET  READY MORNING OF YOUR SURGERY.  Do not eat food or drink liquids :After Midnight.     Take these medicines the morning of surgery with A SIP OF WATER: Flonase if needed, Prilosec  DO NOT TAKE ANY DIABETIC MEDICATIONS DAY OF YOUR SURGERY                               You may not have any metal on your body including hair pins and              piercings  Do not wear jewelry, make-up, lotions, powders or perfumes, deodorant             Do not wear nail polish.  Do not shave  48 hours prior to surgery.                Do not bring valuables to the hospital. [place].  Contacts, dentures or bridgework may not be worn into surgery.  Leave suitcase in the car. After surgery it may be brought to your room.       How to Manage Your Diabetes Before and After Surgery  Why is it important to control my blood sugar before and after surgery? . Improving blood sugar levels before and after surgery helps healing and can limit problems. . A way of improving blood sugar control is eating a healthy diet by: o  Eating less sugar and carbohydrates o  Increasing activity/exercise o  Talking with your doctor about reaching your blood sugar goals . High blood sugars (greater than 180 mg/dL) can raise your risk of infections and slow your recovery, so you will need to focus on controlling your diabetes during the weeks before surgery. . Make sure that the doctor who takes care of your diabetes knows about your planned surgery including the date and location.  How do I manage my blood sugar before surgery? . Check  your blood sugar at least 4 times a day, starting 2 days before surgery, to make sure that the level is not too high or low. o Check your blood sugar the morning of your surgery when you wake up and every 2 hours until you get to the Short Stay unit. . If your blood sugar is less than 70 mg/dL, you will need to treat for low blood sugar: o Do not take insulin. o Treat a low blood sugar (less than 70 mg/dL) with  cup of clear juice (cranberry or apple), 4 glucose tablets, OR glucose gel. o Recheck blood sugar in 15 minutes after treatment (to make sure it is greater than 70 mg/dL). If your blood sugar is not greater than 70 mg/dL on recheck, call [phone] for further instructions. . Report your blood sugar to the short stay nurse when you get to Short Stay.  . If you  are admitted to the hospital after surgery: o Your blood sugar will be checked by the staff and you will probably be given insulin after surgery (instead of oral diabetes medicines) to make sure you have good blood sugar levels. o The goal for blood sugar control after surgery is 80-180 mg/dL.   WHAT DO I DO ABOUT MY DIABETES MEDICATION?  Marland Kitchen Do not take oral diabetes medicines (pills) the morning of surgery.  Patient Signature:  Date:   Nurse Signature:  Date:   Reviewed and Endorsed by [facility] Patient Education Committee, August 2015  Special Instructions: N/A              Please read over the following fact sheets you were given: _____________________________________________________________________             [facility] - Preparing for Surgery Before surgery, you can play an important role.  Because skin is not sterile, your skin needs to be as free of germs as possible.  You can reduce the number of germs on your skin by washing with CHG (chlorahexidine gluconate) soap before surgery.  CHG is an antiseptic cleaner which kills germs and bonds with the skin to continue killing germs even after washing. Please DO  NOT use if you have an allergy to CHG or antibacterial soaps.  If your skin becomes reddened/irritated stop using the CHG and inform your nurse when you arrive at Short Stay. Do not shave (including legs and underarms) for at least 48 hours prior to the first CHG shower.  You may shave your face/neck. Please follow these instructions carefully:  1.  Shower with CHG Soap the night before surgery and the  morning of Surgery.  2.  If you choose to wash your hair, wash your hair first as usual with your  normal  shampoo.  3.  After you shampoo, rinse your hair and body thoroughly to remove the  shampoo.                           4.  Use CHG as you would any other liquid soap.  You can apply chg directly  to the skin and wash                       Gently with a scrungie or clean washcloth.  5.  Apply the CHG Soap to your body ONLY FROM THE NECK DOWN.   Do not use on face/ open                           Wound or open sores. Avoid contact with eyes, ears mouth and genitals (private parts).                       Wash face,  Genitals (private parts) with your normal soap.             6.  Wash thoroughly, paying special attention to the area where your surgery  will be performed.  7.  Thoroughly rinse your body with warm water from the neck down.  8.  DO NOT shower/wash with your normal soap after using and rinsing off  the CHG Soap.                9.  Pat yourself dry with a clean towel.  10.  Wear clean pajamas.            11.  Place clean sheets on your bed the night of your first shower and do not  sleep with pets. Day of Surgery : Do not apply any lotions/deodorants the morning of surgery.  Please wear clean clothes to the hospital/surgery center.  FAILURE TO FOLLOW THESE INSTRUCTIONS MAY RESULT IN THE CANCELLATION OF YOUR SURGERY PATIENT SIGNATURE_________________________________  NURSE  SIGNATURE__________________________________  ________________________________________________________________________

## 2016-03-28 ENCOUNTER — Encounter (HOSPITAL_COMMUNITY)
Admission: RE | Admit: 2016-03-28 | Discharge: 2016-03-28 | Disposition: A | Discharge status: Home / Self Care | Payer: 59 | Source: Ambulatory Visit | Attending: Surgery | Admitting: Surgery

## 2016-03-28 ENCOUNTER — Encounter (HOSPITAL_COMMUNITY): Payer: Self-pay

## 2016-03-28 DIAGNOSIS — E669 Obesity, unspecified: Secondary | ICD-10-CM | POA: Diagnosis not present

## 2016-03-28 DIAGNOSIS — Z01818 Encounter for other preprocedural examination: Secondary | ICD-10-CM | POA: Insufficient documentation

## 2016-03-28 HISTORY — DX: Nausea with vomiting, unspecified: Z98.890

## 2016-03-28 HISTORY — DX: Nausea with vomiting, unspecified: R11.2

## 2016-03-28 HISTORY — DX: Unspecified osteoarthritis, unspecified site: M19.90

## 2016-03-28 HISTORY — DX: Other specified postprocedural states: Z98.890

## 2016-03-28 LAB — COMPREHENSIVE METABOLIC PANEL WITH GFR
ALT: 12 U/L — ABNORMAL LOW (ref 14–54)
AST: 14 U/L — ABNORMAL LOW (ref 15–41)
Albumin: 4.2 g/dL (ref 3.5–5.0)
Alkaline Phosphatase: 57 U/L (ref 38–126)
Anion gap: 7 (ref 5–15)
BUN: 16 mg/dL (ref 6–20)
CO2: 25 mmol/L (ref 22–32)
Calcium: 9.1 mg/dL (ref 8.9–10.3)
Chloride: 107 mmol/L (ref 101–111)
Creatinine, Ser: 0.7 mg/dL (ref 0.44–1.00)
GFR calc Af Amer: >60 mL/min
GFR calc non Af Amer: >60 mL/min
Glucose, Bld: 106 mg/dL — ABNORMAL HIGH (ref 65–99)
Potassium: 4 mmol/L (ref 3.5–5.1)
Sodium: 139 mmol/L (ref 135–145)
Total Bilirubin: 0.4 mg/dL (ref 0.3–1.2)
Total Protein: 7.5 g/dL (ref 6.5–8.1)

## 2016-03-28 LAB — CBC WITH DIFFERENTIAL/PLATELET
Basophils Absolute: 0 10*3/uL (ref 0.0–0.1)
Basophils Relative: 1 %
Eosinophils Absolute: 0.1 10*3/uL (ref 0.0–0.7)
Eosinophils Relative: 2 %
HCT: 41 % (ref 36.0–46.0)
Hemoglobin: 12.9 g/dL (ref 12.0–15.0)
Lymphocytes Relative: 29 %
Lymphs Abs: 1.6 10*3/uL (ref 0.7–4.0)
MCH: 27.4 pg (ref 26.0–34.0)
MCHC: 31.5 g/dL (ref 30.0–36.0)
MCV: 87.2 fL (ref 78.0–100.0)
Monocytes Absolute: 0.4 10*3/uL (ref 0.1–1.0)
Monocytes Relative: 7 %
Neutro Abs: 3.5 10*3/uL (ref 1.7–7.7)
Neutrophils Relative %: 63 %
Platelets: 300 10*3/uL (ref 150–400)
RBC: 4.7 MIL/uL (ref 3.87–5.11)
RDW: 14 % (ref 11.5–15.5)
WBC: 5.6 10*3/uL (ref 4.0–10.5)

## 2016-03-28 LAB — GLUCOSE, CAPILLARY: Glucose-Capillary: 95 mg/dL (ref 65–99)

## 2016-03-28 NOTE — Progress Notes (Signed)
EGK and CXR- 01/19/16 on chart

## 2016-03-29 LAB — HEMOGLOBIN A1C
HEMOGLOBIN A1C: 5.8 % — AB (ref 4.8–5.6)
MEAN PLASMA GLUCOSE: 120 mg/dL

## 2016-04-02 ENCOUNTER — Inpatient Hospital Stay (HOSPITAL_COMMUNITY): Payer: 59 | Admitting: Anesthesiology

## 2016-04-02 ENCOUNTER — Encounter (HOSPITAL_COMMUNITY): Payer: Self-pay | Admitting: *Deleted

## 2016-04-02 ENCOUNTER — Encounter (HOSPITAL_COMMUNITY)
Admission: RE | Disposition: A | Discharge status: Home / Self Care | Payer: Self-pay | Source: Ambulatory Visit | Attending: Surgery

## 2016-04-02 ENCOUNTER — Inpatient Hospital Stay (HOSPITAL_COMMUNITY)
Admission: RE | Admit: 2016-04-02 | Discharge: 2016-04-04 | DRG: 621 | Disposition: A | Discharge status: Home / Self Care | Payer: 59 | Source: Ambulatory Visit | Attending: Surgery | Admitting: Surgery

## 2016-04-02 DIAGNOSIS — E119 Type 2 diabetes mellitus without complications: Secondary | ICD-10-CM | POA: Diagnosis present

## 2016-04-02 DIAGNOSIS — Z7984 Long term (current) use of oral hypoglycemic drugs: Secondary | ICD-10-CM

## 2016-04-02 DIAGNOSIS — Z79899 Other long term (current) drug therapy: Secondary | ICD-10-CM | POA: Diagnosis not present

## 2016-04-02 DIAGNOSIS — K2950 Unspecified chronic gastritis without bleeding: Secondary | ICD-10-CM | POA: Diagnosis not present

## 2016-04-02 DIAGNOSIS — Z9884 Bariatric surgery status: Secondary | ICD-10-CM

## 2016-04-02 DIAGNOSIS — K219 Gastro-esophageal reflux disease without esophagitis: Secondary | ICD-10-CM | POA: Diagnosis not present

## 2016-04-02 DIAGNOSIS — M1990 Unspecified osteoarthritis, unspecified site: Secondary | ICD-10-CM | POA: Diagnosis not present

## 2016-04-02 DIAGNOSIS — Z6841 Body Mass Index (BMI) 40.0 and over, adult: Secondary | ICD-10-CM | POA: Diagnosis not present

## 2016-04-02 HISTORY — PX: LAPAROSCOPIC GASTRIC SLEEVE RESECTION: SHX5895

## 2016-04-02 HISTORY — DX: Bariatric surgery status: Z98.84

## 2016-04-02 LAB — CREATININE, SERUM
CREATININE: 0.74 mg/dL (ref 0.44–1.00)
GFR calc Af Amer: >60 mL/min (ref >60)
GFR calc non Af Amer: >60 mL/min (ref >60)

## 2016-04-02 LAB — CBC
HCT: 41.2 % (ref 36.0–46.0)
Hemoglobin: 13.6 g/dL (ref 12.0–15.0)
MCH: 28.1 pg (ref 26.0–34.0)
MCHC: 33 g/dL (ref 30.0–36.0)
MCV: 85.1 fL (ref 78.0–100.0)
PLATELETS: 258 10*3/uL (ref 150–400)
RBC: 4.84 MIL/uL (ref 3.87–5.11)
RDW: 13.8 % (ref 11.5–15.5)
WBC: 12.6 10*3/uL — ABNORMAL HIGH (ref 4.0–10.5)

## 2016-04-02 LAB — HEMOGLOBIN AND HEMATOCRIT, BLOOD
HCT: 41 % (ref 36.0–46.0)
Hemoglobin: 12.9 g/dL (ref 12.0–15.0)

## 2016-04-02 LAB — GLUCOSE, CAPILLARY
GLUCOSE-CAPILLARY: 89 mg/dL (ref 65–99)
Glucose-Capillary: 132 mg/dL — ABNORMAL HIGH (ref 65–99)
Glucose-Capillary: 113 mg/dL — ABNORMAL HIGH (ref 65–99)
Glucose-Capillary: 113 mg/dL — ABNORMAL HIGH (ref 65–99)

## 2016-04-02 LAB — PREGNANCY, URINE: Preg Test, Ur: NEGATIVE

## 2016-04-02 SURGERY — GASTRECTOMY, SLEEVE, LAPAROSCOPIC
Anesthesia: General

## 2016-04-02 MED ORDER — SUCCINYLCHOLINE CHLORIDE 200 MG/10ML IV SOSY
PREFILLED_SYRINGE | INTRAVENOUS | Status: DC | PRN
  Administered 2016-04-02: 160 mg via INTRAVENOUS

## 2016-04-02 MED ORDER — ACETAMINOPHEN 10 MG/ML IV SOLN
INTRAVENOUS | Status: DC | PRN
  Administered 2016-04-02: 1000 mg via INTRAVENOUS

## 2016-04-02 MED ORDER — DEXAMETHASONE SODIUM PHOSPHATE 10 MG/ML IJ SOLN
INTRAMUSCULAR | Status: AC
  Filled 2016-04-02: qty 1

## 2016-04-02 MED ORDER — LACTATED RINGERS IR SOLN
Status: DC | PRN
  Administered 2016-04-02: 3000 mL

## 2016-04-02 MED ORDER — ACETAMINOPHEN 160 MG/5ML PO SOLN: 650.0000 mg | ORAL | Status: DC | PRN

## 2016-04-02 MED ORDER — SUGAMMADEX SODIUM 500 MG/5ML IV SOLN
INTRAVENOUS | Status: AC
  Filled 2016-04-02: qty 5

## 2016-04-02 MED ORDER — OXYCODONE HCL 5 MG/5ML PO SOLN
5.0000 mg | ORAL | Status: DC | PRN
  Administered 2016-04-03 (×2): 5 mg via ORAL
  Filled 2016-04-02 (×2): qty 5

## 2016-04-02 MED ORDER — PROMETHAZINE HCL 25 MG/ML IJ SOLN
INTRAMUSCULAR | Status: AC
  Administered 2016-04-02: 6.25 mg via INTRAVENOUS
  Filled 2016-04-02: qty 1

## 2016-04-02 MED ORDER — ROCURONIUM BROMIDE 10 MG/ML (PF) SYRINGE
PREFILLED_SYRINGE | INTRAVENOUS | Status: DC | PRN
  Administered 2016-04-02: 10 mg via INTRAVENOUS
  Administered 2016-04-02: 50 mg via INTRAVENOUS

## 2016-04-02 MED ORDER — SUFENTANIL CITRATE 50 MCG/ML IV SOLN
INTRAVENOUS | Status: DC | PRN
  Administered 2016-04-02: 20 ug via INTRAVENOUS
  Administered 2016-04-02: 10 ug via INTRAVENOUS

## 2016-04-02 MED ORDER — PREMIER PROTEIN SHAKE
2.0000 [oz_av] | ORAL | Status: DC
  Administered 2016-04-04: 2 [oz_av] via ORAL

## 2016-04-02 MED ORDER — DEXAMETHASONE SODIUM PHOSPHATE 10 MG/ML IJ SOLN
INTRAMUSCULAR | Status: DC | PRN
  Administered 2016-04-02: 10 mg via INTRAVENOUS

## 2016-04-02 MED ORDER — EPHEDRINE 5 MG/ML INJ
INTRAVENOUS | Status: AC
  Filled 2016-04-02: qty 10

## 2016-04-02 MED ORDER — BUPIVACAINE LIPOSOME 1.3 % IJ SUSP
20.0000 mL | Freq: Once | INTRAMUSCULAR | Status: AC
Start: 2016-04-02 — End: 2016-04-02
  Administered 2016-04-02: 20 mL
  Filled 2016-04-02: qty 20

## 2016-04-02 MED ORDER — HYDROMORPHONE HCL 1 MG/ML IJ SOLN
INTRAMUSCULAR | Status: AC
  Administered 2016-04-02: 0.5 mg via INTRAVENOUS
  Filled 2016-04-02: qty 0.5

## 2016-04-02 MED ORDER — LIDOCAINE 2% (20 MG/ML) 5 ML SYRINGE
INTRAMUSCULAR | Status: AC
  Filled 2016-04-02: qty 5

## 2016-04-02 MED ORDER — SUCCINYLCHOLINE CHLORIDE 200 MG/10ML IV SOSY
PREFILLED_SYRINGE | INTRAVENOUS | Status: AC
  Filled 2016-04-02: qty 10

## 2016-04-02 MED ORDER — HEPARIN SODIUM (PORCINE) 5000 UNIT/ML IJ SOLN
5000.0000 [IU] | Freq: Three times a day (TID) | INTRAMUSCULAR | Status: DC
  Administered 2016-04-02 – 2016-04-04 (×6): 5000 [IU] via SUBCUTANEOUS
  Filled 2016-04-02 (×6): qty 1

## 2016-04-02 MED ORDER — LACTATED RINGERS IV SOLN
INTRAVENOUS | Status: DC
  Administered 2016-04-02: 1000 mL via INTRAVENOUS
  Administered 2016-04-02: 13:00:00 via INTRAVENOUS

## 2016-04-02 MED ORDER — PROMETHAZINE HCL 25 MG/ML IJ SOLN
6.2500 mg | INTRAMUSCULAR | Status: DC | PRN
  Administered 2016-04-02: 6.25 mg via INTRAVENOUS

## 2016-04-02 MED ORDER — SUFENTANIL CITRATE 50 MCG/ML IV SOLN
INTRAVENOUS | Status: AC
  Filled 2016-04-02: qty 1

## 2016-04-02 MED ORDER — KCL IN DEXTROSE-NACL 20-5-0.45 MEQ/L-%-% IV SOLN
INTRAVENOUS | Status: DC
  Administered 2016-04-02: 17:00:00 via INTRAVENOUS
  Administered 2016-04-02: 100 mL/h via INTRAVENOUS
  Administered 2016-04-03 – 2016-04-04 (×2): via INTRAVENOUS
  Filled 2016-04-02 (×5): qty 1000

## 2016-04-02 MED ORDER — SCOPOLAMINE 1 MG/3DAYS TD PT72
MEDICATED_PATCH | TRANSDERMAL | Status: AC
  Filled 2016-04-02: qty 1

## 2016-04-02 MED ORDER — ACETAMINOPHEN 10 MG/ML IV SOLN
INTRAVENOUS | Status: AC
  Filled 2016-04-02: qty 100

## 2016-04-02 MED ORDER — CEFOTETAN DISODIUM-DEXTROSE 2-2.08 GM-% IV SOLR
2.0000 g | INTRAVENOUS | Status: AC
  Administered 2016-04-02: 2 g via INTRAVENOUS

## 2016-04-02 MED ORDER — SUGAMMADEX SODIUM 200 MG/2ML IV SOLN
INTRAVENOUS | Status: DC | PRN
  Administered 2016-04-02: 300 mg via INTRAVENOUS

## 2016-04-02 MED ORDER — MORPHINE SULFATE (PF) 2 MG/ML IV SOLN
2.0000 mg | INTRAVENOUS | Status: DC | PRN
  Administered 2016-04-02 – 2016-04-03 (×4): 2 mg via INTRAVENOUS
  Filled 2016-04-02 (×4): qty 1

## 2016-04-02 MED ORDER — HYDROMORPHONE HCL 1 MG/ML IJ SOLN
0.2500 mg | INTRAMUSCULAR | Status: DC | PRN
  Administered 2016-04-02 (×4): 0.5 mg via INTRAVENOUS

## 2016-04-02 MED ORDER — MIDAZOLAM HCL 2 MG/2ML IJ SOLN
INTRAMUSCULAR | Status: DC | PRN
  Administered 2016-04-02: 2 mg via INTRAVENOUS

## 2016-04-02 MED ORDER — SODIUM CHLORIDE 0.9 % IJ SOLN
INTRAMUSCULAR | Status: AC
  Filled 2016-04-02: qty 10

## 2016-04-02 MED ORDER — MIDAZOLAM HCL 2 MG/2ML IJ SOLN
INTRAMUSCULAR | Status: AC
  Filled 2016-04-02: qty 2

## 2016-04-02 MED ORDER — ROCURONIUM BROMIDE 50 MG/5ML IV SOSY
PREFILLED_SYRINGE | INTRAVENOUS | Status: AC
  Filled 2016-04-02: qty 5

## 2016-04-02 MED ORDER — 0.9 % SODIUM CHLORIDE (POUR BTL) OPTIME
TOPICAL | Status: DC | PRN
  Administered 2016-04-02: 1000 mL

## 2016-04-02 MED ORDER — ONDANSETRON HCL 4 MG/2ML IJ SOLN
INTRAMUSCULAR | Status: DC | PRN
  Administered 2016-04-02: 4 mg via INTRAVENOUS

## 2016-04-02 MED ORDER — APREPITANT 80 MG PO CAPS
80.0000 mg | ORAL_CAPSULE | ORAL | Status: AC
  Administered 2016-04-02: 80 mg via ORAL
  Filled 2016-04-02: qty 1

## 2016-04-02 MED ORDER — EPHEDRINE SULFATE 50 MG/ML IJ SOLN
INTRAMUSCULAR | Status: DC | PRN
  Administered 2016-04-02: 10 mg via INTRAVENOUS

## 2016-04-02 MED ORDER — ONDANSETRON HCL 4 MG/2ML IJ SOLN
INTRAMUSCULAR | Status: AC
  Filled 2016-04-02: qty 2

## 2016-04-02 MED ORDER — HEPARIN SODIUM (PORCINE) 5000 UNIT/ML IJ SOLN
5000.0000 [IU] | INTRAMUSCULAR | Status: AC
  Administered 2016-04-02: 5000 [IU] via SUBCUTANEOUS
  Filled 2016-04-02: qty 1

## 2016-04-02 MED ORDER — PROPOFOL 10 MG/ML IV BOLUS
INTRAVENOUS | Status: AC
  Filled 2016-04-02: qty 20

## 2016-04-02 MED ORDER — SODIUM CHLORIDE 0.9 % IJ SOLN
INTRAMUSCULAR | Status: DC | PRN
  Administered 2016-04-02: 10 mL

## 2016-04-02 MED ORDER — CEFOTETAN DISODIUM-DEXTROSE 2-2.08 GM-% IV SOLR
INTRAVENOUS | Status: AC
  Filled 2016-04-02: qty 50

## 2016-04-02 MED ORDER — PROPOFOL 10 MG/ML IV BOLUS
INTRAVENOUS | Status: DC | PRN
  Administered 2016-04-02: 200 mg via INTRAVENOUS

## 2016-04-02 MED ORDER — LIDOCAINE 2% (20 MG/ML) 5 ML SYRINGE
INTRAMUSCULAR | Status: DC | PRN
  Administered 2016-04-02: 100 mg via INTRAVENOUS

## 2016-04-02 MED ORDER — ONDANSETRON HCL 4 MG/2ML IJ SOLN
4.0000 mg | INTRAMUSCULAR | Status: DC | PRN
  Administered 2016-04-03 (×2): 4 mg via INTRAVENOUS
  Filled 2016-04-02 (×2): qty 2

## 2016-04-02 MED ORDER — PANTOPRAZOLE SODIUM 40 MG IV SOLR
40.0000 mg | Freq: Every day | INTRAVENOUS | Status: DC
  Administered 2016-04-02 – 2016-04-03 (×2): 40 mg via INTRAVENOUS
  Filled 2016-04-02 (×2): qty 40

## 2016-04-02 MED ORDER — ACETAMINOPHEN 160 MG/5ML PO SOLN: 325.0000 mg | ORAL | Status: DC | PRN

## 2016-04-02 MED ORDER — SCOPOLAMINE 1 MG/3DAYS TD PT72
1.0000 | MEDICATED_PATCH | Freq: Once | TRANSDERMAL | Status: DC
  Administered 2016-04-02: 1.5 mg via TRANSDERMAL

## 2016-04-02 SURGICAL SUPPLY — 65 items
APPLICATOR COTTON TIP 6IN STRL (MISCELLANEOUS) IMPLANT
APPLIER CLIP 5 13 M/L LIGAMAX5 (MISCELLANEOUS)
APPLIER CLIP ROT 10 11.4 M/L (STAPLE)
APPLIER CLIP ROT 13.4 12 LRG (CLIP)
BLADE SURG 15 STRL LF DISP TIS (BLADE) ×1 IMPLANT
BLADE SURG 15 STRL SS (BLADE) ×2
CABLE HIGH FREQUENCY MONO STRZ (ELECTRODE) ×3 IMPLANT
CLIP APPLIE 5 13 M/L LIGAMAX5 (MISCELLANEOUS) IMPLANT
CLIP APPLIE ROT 10 11.4 M/L (STAPLE) IMPLANT
CLIP APPLIE ROT 13.4 12 LRG (CLIP) IMPLANT
COVER SURGICAL LIGHT HANDLE (MISCELLANEOUS) ×3 IMPLANT
DERMABOND ADVANCED (GAUZE/BANDAGES/DRESSINGS)
DERMABOND ADVANCED .7 DNX12 (GAUZE/BANDAGES/DRESSINGS) IMPLANT
DEVICE SUT QUICK LOAD TK 5 (STAPLE) IMPLANT
DEVICE SUT TI-KNOT TK 5X26 (MISCELLANEOUS) IMPLANT
DEVICE SUTURE ENDOST 10MM (ENDOMECHANICALS) IMPLANT
DEVICE TI KNOT TK5 (MISCELLANEOUS)
DEVICE TROCAR PUNCTURE CLOSURE (ENDOMECHANICALS) ×3 IMPLANT
DISSECTOR BLUNT TIP ENDO 5MM (MISCELLANEOUS) IMPLANT
ELECT REM PT RETURN 9FT ADLT (ELECTROSURGICAL) ×3
ELECTRODE REM PT RTRN 9FT ADLT (ELECTROSURGICAL) ×1 IMPLANT
GAUZE SPONGE 4X4 12PLY STRL (GAUZE/BANDAGES/DRESSINGS) IMPLANT
GLOVE BIOGEL M 8.0 STRL (GLOVE) ×3 IMPLANT
GOWN STRL REUS W/TWL XL LVL3 (GOWN DISPOSABLE) ×12 IMPLANT
HANDLE STAPLE EGIA 4 XL (STAPLE) ×3 IMPLANT
HOVERMATT SINGLE USE (MISCELLANEOUS) ×3 IMPLANT
IRRIG SUCT STRYKERFLOW 2 WTIP (MISCELLANEOUS) ×3
IRRIGATION SUCT STRKRFLW 2 WTP (MISCELLANEOUS) ×1 IMPLANT
KIT BASIN OR (CUSTOM PROCEDURE TRAY) ×3 IMPLANT
MARKER SKIN DUAL TIP RULER LAB (MISCELLANEOUS) ×3 IMPLANT
NEEDLE SPNL 22GX3.5 QUINCKE BK (NEEDLE) ×3 IMPLANT
PACK UNIVERSAL I (CUSTOM PROCEDURE TRAY) ×3 IMPLANT
POUCH SPECIMEN RETRIEVAL 10MM (ENDOMECHANICALS) IMPLANT
QUICK LOAD TK 5 (STAPLE)
RELOAD TRI 45 ART MED THCK BLK (STAPLE) ×3 IMPLANT
RELOAD TRI 45 ART MED THCK PUR (STAPLE) IMPLANT
RELOAD TRI 60 ART MED THCK BLK (STAPLE) ×3 IMPLANT
RELOAD TRI 60 ART MED THCK PUR (STAPLE) ×9 IMPLANT
SCISSORS LAP 5X45 EPIX DISP (ENDOMECHANICALS) IMPLANT
SEALANT SURGICAL APPL DUAL CAN (MISCELLANEOUS) IMPLANT
SHEARS HARMONIC ACE PLUS 45CM (MISCELLANEOUS) ×3 IMPLANT
SLEEVE ADV FIXATION 5X100MM (TROCAR) ×6 IMPLANT
SLEEVE GASTRECTOMY 36FR VISIGI (MISCELLANEOUS) ×3 IMPLANT
SOLUTION ANTI FOG 6CC (MISCELLANEOUS) ×3 IMPLANT
SPONGE LAP 18X18 X RAY DECT (DISPOSABLE) ×3 IMPLANT
STAPLER VISISTAT 35W (STAPLE) ×3 IMPLANT
SUT SURGIDAC NAB ES-9 0 48 120 (SUTURE) IMPLANT
SUT VIC AB 4-0 SH 18 (SUTURE) ×3 IMPLANT
SUT VICRYL 0 TIES 12 18 (SUTURE) ×3 IMPLANT
SYR 10ML ECCENTRIC (SYRINGE) ×3 IMPLANT
SYR 20CC LL (SYRINGE) ×3 IMPLANT
SYR 50ML LL SCALE MARK (SYRINGE) ×3 IMPLANT
SYSTEM WECK SHIELD CLOSURE (TROCAR) IMPLANT
TOWEL OR 17X26 10 PK STRL BLUE (TOWEL DISPOSABLE) ×6 IMPLANT
TOWEL OR NON WOVEN STRL DISP B (DISPOSABLE) ×3 IMPLANT
TRAY FOLEY W/METER SILVER 16FR (SET/KITS/TRAYS/PACK) IMPLANT
TROCAR ADV FIXATION 12X100MM (TROCAR) ×3 IMPLANT
TROCAR ADV FIXATION 5X100MM (TROCAR) ×3 IMPLANT
TROCAR BLADELESS 15MM (ENDOMECHANICALS) ×3 IMPLANT
TROCAR BLADELESS OPT 5 100 (ENDOMECHANICALS) ×3 IMPLANT
TUBE CALIBRATION LAPBAND (TUBING) IMPLANT
TUBING CONNECTING 10 (TUBING) ×2 IMPLANT
TUBING CONNECTING 10' (TUBING) ×1
TUBING ENDO SMARTCAP (MISCELLANEOUS) ×3 IMPLANT
TUBING INSUF HEATED (TUBING) ×3 IMPLANT

## 2016-04-02 NOTE — Op Note (Signed)
Surgeon: Kaylyn Lim, MD, FACS  Asst:  Alphonsa Overall, MD,FACS  Anes:  General endotracheal  Procedure: Laparoscopic sleeve gastrectomy and upper endoscopy  Diagnosis: Morbid obesity  Complications: none  EBL:   minimal cc  Description of Procedure:  The patient was take to OR 1 and given general anesthesia.  The abdomen was prepped with PCMX and draped sterilely.  A timeout was performed.  Access to the abdomen was achieved with a 5 mm Optiview through the left upper quadrant without difficulty.  Following insufflation, the state of the abdomen was found to be free of adhesions.  A calibration tube was passed into the stomach and the test was negative with 10 cc of air.  This tube was withdrawn and  the ViSiGi 36Fr tube was inserted to deflate the stomach and was pulled back into the esophagus.    The pylorus was identified and we measured 5 cm back and marked the antrum.  At that point we began dissection to take down the greater curvature of the stomach using the Harmonic scalpel.  This dissection was taken all the way up to the left crus.  Posterior attachments of the stomach were also taken down.    The ViSiGi tube was then passed into the antrum and suction applied so that it was snug along the lessor curvature.  The "crow's foot" or incisura was identified.  The sleeve gastrectomy was begun using the [employer] stapler beginning with a 4.5 cm black load with TRS;  This was followed by a 6 cm black with trs and then multiple purple loads with TRS .  When the sleeve was complete the tube was taken off suction and insufflated briefly.  The tube was withdrawn.  Upper endoscopy was then performed by Dr. Lucia Gaskins and no bleeding or bubbles were seen.     The specimen was extracted through the 15 trocar site.  Wounds were infiltrated with Exparel and closed with 4-0 Monocryl.    Matt B. Hassell Done, [place], [facility], [place]

## 2016-04-02 NOTE — H&P (Signed)
[name] 03/08/2016 11:58 AM Location: [facility] Patient #: [id number] DOB: Nov 30, 1975 Married / Language: English / Race: Black or African American Female   History of Present Illness Rodman Key B. Hassell Done MD; 03/08/2016 12:06 PM) Patient words: 40 year old high BMI patient for sleeve gastrectomy.  Informed consent has been obtained and she is ready to proceed.      Preoperative visit with at today's weight 316 BMI of 60. Preop for a sleeve gastrectomy on December 11. She completed her dietary instruction.  Her gallbladder ultrasound was negative for gallstones. We will give her pain meds. I will need to update her history and physical in Epic and get her orders in.  She works at [employer].     Other Problems Malachi Bonds, CMA; 03/08/2016 11:59 AM) Diabetes Mellitus  Gastroesophageal Reflux Disease   Past Surgical History Malachi Bonds, CMA; 03/08/2016 11:59 AM) Cesarean Section - Multiple   Diagnostic Studies History Malachi Bonds, CMA; 03/08/2016 11:59 AM) Colonoscopy  never Mammogram  1-3 years ago Pap Smear  1-5 years ago  Allergies Malachi Bonds, CMA; 03/08/2016 12:00 PM) No Known Drug Allergies 12/30/2015  Medication History (Chemira Jones, CMA; 03/08/2016 12:00 PM) Bydureon (2MG  For Susp ER, Subcutaneous) Active. Invokana (300MG  Tablet, Oral) Active. MetFORMIN HCl (1000MG  Tablet, Oral) Active. Omeprazole (40MG  Capsule DR, Oral) Active. Fluticasone Propionate (50MCG/ACT Suspension, Nasal) Active. Iron (325 (65 Fe)MG Tablet, Oral) Active. Medications Reconciled  Pregnancy / Birth History Malachi Bonds, CMA; 03/08/2016 11:59 AM) Age at menarche  62 years. Contraceptive History  Intrauterine device. Gravida  3 Irregular periods  Length (months) of breastfeeding  3-6 Maternal age  28-25 Para  2    Review of Systems Malachi Bonds CMA; 03/08/2016 11:59 AM) General Not Present- Appetite Loss, Chills, Fatigue,  Fever, Night Sweats, Weight Gain and Weight Loss. Skin Not Present- Change in Wart/Mole, Dryness, Hives, Jaundice, New Lesions, Non-Healing Wounds, Rash and Ulcer. HEENT Present- Seasonal Allergies. Not Present- Earache, Hearing Loss, Hoarseness, Nose Bleed, Oral Ulcers, Ringing in the Ears, Sinus Pain, Sore Throat, Visual Disturbances, Wears glasses/contact lenses and Yellow Eyes. Respiratory Not Present- Bloody sputum, Chronic Cough, Difficulty Breathing, Snoring and Wheezing. Breast Not Present- Breast Mass, Breast Pain, Nipple Discharge and Skin Changes. Cardiovascular Not Present- Chest Pain, Difficulty Breathing Lying Down, Leg Cramps, Palpitations, Rapid Heart Rate, Shortness of Breath and Swelling of Extremities. Gastrointestinal Present- Indigestion. Not Present- Abdominal Pain, Bloating, Bloody Stool, Change in Bowel Habits, Chronic diarrhea, Constipation, Difficulty Swallowing, Excessive gas, Gets full quickly at meals, Hemorrhoids, Nausea, Rectal Pain and Vomiting. Female Genitourinary Not Present- Frequency, Nocturia, Painful Urination, Pelvic Pain and Urgency. Musculoskeletal Present- Joint Pain. Not Present- Back Pain, Joint Stiffness, Muscle Pain, Muscle Weakness and Swelling of Extremities. Neurological Not Present- Decreased Memory, Fainting, Headaches, Numbness, Seizures, Tingling, Tremor, Trouble walking and Weakness. Psychiatric Not Present- Anxiety, Bipolar, Change in Sleep Pattern, Depression, Fearful and Frequent crying. Endocrine Not Present- Cold Intolerance, Excessive Hunger, Hair Changes, Heat Intolerance, Hot flashes and New Diabetes. Hematology Not Present- Blood Thinners, Easy Bruising, Excessive bleeding, Gland problems, HIV and Persistent Infections.  Vitals  Weight: 316.8 lb Height: 60.25in Body Surface Area: 2.28 m Body Mass Index: 61.36 kg/m  Pulse: 86 (Regular)  BP: 120/84 (Sitting, Left Arm, Standard)   Physical Exam (Ercilia Bettinger B. Hassell Done MD;  03/08/2016 12:08 PM) General [id number] NAD HEENT unremarkable Note: Heart SR without murmurs (EKG changes are chronic) Chest clear to auscultation. Abdomen nontender EXT FROM Neuro alert and oriented x 3  Assessment & Plan Rodman Key B. Hassell Done MD; 03/08/2016 12:09 PM) OBESITY (E66.9) BMI 60  For sleeve gastrectomy

## 2016-04-02 NOTE — Anesthesia Procedure Notes (Signed)
Procedure Name: Intubation Date/Time: 04/02/2016 11:47 AM Performed by: Danley Danker L Patient Re-evaluated:Patient Re-evaluated prior to inductionOxygen Delivery Method: Circle system utilized Preoxygenation: Pre-oxygenation with 100% oxygen Intubation Type: IV induction Ventilation: Mask ventilation without difficulty and Oral airway inserted - appropriate to patient size Laryngoscope Size: Sabra Heck and 2 Grade View: Grade I Tube type: Oral Tube size: 7.5 mm Number of attempts: 1 Airway Equipment and Method: Stylet Placement Confirmation: ETT inserted through vocal cords under direct vision,  positive ETCO2 and breath sounds checked- equal and bilateral Secured at: 21 cm Tube secured with: Tape Dental Injury: Teeth and Oropharynx as per pre-operative assessment

## 2016-04-02 NOTE — Transfer of Care (Signed)
Immediate Anesthesia Transfer of Care Note  Patient: [name]  Procedure(s) Performed: Procedure(s): LAPAROSCOPIC GASTRIC SLEEVE RESECTION, UPPER ENDO (N/A)  Patient Location: PACU  Anesthesia Type:General  Level of Consciousness: awake  Airway & Oxygen Therapy: Patient Spontanous Breathing and Patient connected to face mask oxygen  Post-op Assessment: Report given to RN and Post -op Vital signs reviewed and stable  Post vital signs: Reviewed and stable  Last Vitals:  Vitals:   04/02/16 0918  BP: (!) 106/49  Pulse: 71  Resp: 18  Temp: 36.7 C    Last Pain:  Vitals:   04/02/16 0934  TempSrc:   PainSc: 0-No pain      Patients Stated Pain Goal: 4 ([id number])  Complications: No apparent anesthesia complications

## 2016-04-02 NOTE — Discharge Instructions (Addendum)
° ° ° °GASTRIC BYPASS/SLEEVE ° Home Care Instructions ° ° These instructions are to help you care for yourself when you go home. ° °Call: If you have any problems. °• Call [phone] and ask for the surgeon on call °• If you need immediate assistance come to the ER at [place]. Tell the ER staff you are a new post-op gastric bypass or gastric sleeve patient  °Signs and symptoms to report: • Severe  vomiting or nausea °o If you cannot handle clear liquids for longer than 1 day, call your surgeon °• Abdominal pain which does not get better after taking your pain medication °• Fever greater than 100.4°  F and chills °• Heart rate over 100 beats a minute °• Trouble breathing °• Chest pain °• Redness,  swelling, drainage, or foul odor at incision (surgical) sites °• If your incisions open or pull apart °• Swelling or pain in calf (lower leg) °• Diarrhea (Loose bowel movements that happen often), frequent watery, uncontrolled bowel movements °• Constipation, (no bowel movements for 3 days) if this happens: °o Take Milk of Magnesia, 2 tablespoons by mouth, 3 times a day for 2 days if needed °o Stop taking Milk of Magnesia once you have had a bowel movement °o Call your doctor if constipation continues °Or °o Take Miralax  (instead of Milk of Magnesia) following the label instructions °o Stop taking Miralax once you have had a bowel movement °o Call your doctor if constipation continues °• Anything you think is “abnormal for you” °  °Normal side effects after surgery: • Unable to sleep at night or unable to concentrate °• Irritability °• Being tearful (crying) or depressed ° °These are common complaints, possibly related to your anesthesia, stress of surgery, and change in lifestyle, that usually go away a few weeks after surgery. If these feelings continue, call your medical doctor.  °Wound Care: You may have surgical glue, steri-strips, or staples over your incisions after surgery °• Surgical glue: Looks like clear  film over your incisions and will wear off a little at a time °• Steri-strips: Adhesive strips of tape over your incisions. You may notice a yellowish color on skin under the steri-strips. This is used to make the steri-strips stick better. Do not pull the steri-strips off - let them fall off °• Staples: Staples may be removed before you leave the hospital °o If you go home with staples, call [facility] for an appointment with your surgeon’s nurse to have staples removed 10 days after surgery, [phone] °• Showering: You may shower two (2) days after your surgery unless your surgeon tells you differently °o Wash gently around incisions with warm soapy water, rinse well, and gently pat dry °o If you have a drain (tube from your incision), you may need someone to hold this while you shower °o No tub baths until staples are removed and incisions are healed °  °Medications: • Medications should be liquid or crushed if larger than the size of a dime °• Extended release pills (medication that releases a little bit at a time through the  day) should not be crushed °• Depending on the size and number of medications you take, you may need to space (take a few throughout the day)/change the time you take your medications so that you do not over-fill your pouch (smaller stomach) °• Make sure you follow-up with you primary care physician to make medication changes needed during rapid weight loss and life -style changes °•   If you have diabetes, follow up with your doctor that orders your diabetes medication(s) within one week after surgery and check your blood sugar regularly ° °• Do not drive while taking narcotics (pain medications) ° °• Do not take acetaminophen (Tylenol) and Roxicet or Lortab Elixir at the same time since these pain medications contain acetaminophen °  °Diet:  °First 2 Weeks You will see the nutritionist about two (2) weeks after your surgery. The nutritionist will increase the types of  foods you can eat if you are handling liquids well: °• If you have severe vomiting or nausea and cannot handle clear liquids lasting longer than 1 day call your surgeon °Protein Shake °• Drink at least 2 ounces of shake 5-6 times per day °• Each serving of protein shakes (usually 8-12 ounces) should have a minimum of: °o 15 grams of protein °o And no more than 5 grams of carbohydrate °• Goal for protein each day: °o Men = 80 grams per day °o Women = 60 grams per day °  ° • Protein powder may be added to fluids such as non-fat milk or Lactaid milk or Soy milk (limit to 35 grams added protein powder per serving) ° °Hydration °• Slowly increase the amount of water and other clear liquids as tolerated (See Acceptable Fluids) °• Slowly increase the amount of protein shake as tolerated °• Sip fluids slowly and throughout the day °• May use sugar substitutes in small amounts (no more than 6-8 packets per day; i.e. Splenda) ° °Fluid Goal °• The first goal is to drink at least 8 ounces of protein shake/drink per day (or as directed by the nutritionist); some examples of protein shakes are Syntrax Nectar, Adkins Advantage, EAS Edge HP, and Unjury. - See handout from pre-op Bariatric Education Class: °o Slowly increase the amount of protein shake you drink as tolerated °o You may find it easier to slowly sip shakes throughout the day °o It is important to get your proteins in first °• Your fluid goal is to drink 64-100 ounces of fluid daily °o It may take a few weeks to build up to this  °• 32 oz. (or more) should be clear liquids °And °• 32 oz. (or more) should be full liquids (see below for examples) °• Liquids should not contain sugar, caffeine, or carbonation ° °Clear Liquids: °• Water of Sugar-free flavored water (i.e. Fruit H²O, Propel) °• Decaffeinated coffee or tea (sugar-free) °• Crystal lite, Wyler’s Lite, Minute Maid Lite °• Sugar-free Jell-O °• Bouillon or broth °• Sugar-free Popsicle:    - Less than 20 calories  each; Limit 1 per day ° °Full Liquids: °                  Protein Shakes/Drinks + 2 choices per day of other full liquids °• Full liquids must be: °o No More Than 12 grams of Carbs per serving °o No More Than 3 grams of Fat per serving °• Strained low-fat cream soup °• Non-Fat milk °• Fat-free Lactaid Milk °• Sugar-free yogurt (Dannon Lite & Fit, Greek yogurt) ° °  °Vitamins and Minerals • Start 1 day after surgery unless otherwise directed by your surgeon °• 2 Chewable Multivitamin / Multimineral Supplement with iron (i.e. Centrum for Adults) °• Vitamin B-12, 350-500 micrograms sub-lingual (place tablet under the tongue) each day °• Chewable Calcium Citrate with Vitamin D-3 °(Example: 3 Chewable Calcium  Plus 600 with Vitamin D-3) °o Take 500 mg three (3) times a day for   a total of 1500 mg each day °o Do not take all 3 doses of calcium at one time as it may cause constipation, and you can only absorb 500 mg at a time °o Do not mix multivitamins containing iron with calcium supplements;  take 2 hours apart °o Do not substitute Tums (calcium carbonate) for your calcium °• Menstruating women and those at risk for anemia ( a blood disease that causes weakness) may need extra iron °o Talk to your doctor to see if you need more iron °• If you need extra iron: Total daily Iron recommendation (including Vitamins) is 50 to 100 mg Iron/day °• Do not stop taking or change any vitamins or minerals until you talk to your nutritionist or surgeon °• Your nutritionist and/or surgeon must approve all vitamin and mineral supplements °  °Activity and Exercise: It is important to continue walking at home. Limit your physical activity as instructed by your doctor. During this time, use these guidelines: °• Do not lift anything greater than ten  (10) pounds for at least two (2) weeks °• Do not go back to work or drive until your surgeon says you can °• You may have sex when you feel comfortable °o It is VERY important for female  patients to use a reliable birth control method; fertility often increase after surgery °o Do not get pregnant for at least 18 months °• Start exercising as soon as your doctor tells you that you can °o Make sure your doctor approves any physical activity °• Start with a simple walking program °• Walk 5-15 minutes each day, 7 days per week °• Slowly increase until you are walking 30-45 minutes per day °• Consider joining our BELT program. [phone] or email [email or web address] °  °Special Instructions Things to remember: °• Free counseling is available for you and your family through collaboration between [place] and [employer]. Please call [phone] and leave a message °• Use your CPAP when sleeping if this applies to you °• Consider buying a medical alert bracelet that says you had lap-band surgery °  °  You will likely have your first fill (fluid added to your band) 6 - 8 weeks after surgery °• [facility] has a free Bariatric Surgery Support Group that meets monthly, the 3rd Thursday, 6pm. [facility] Classrooms. You can see classes online at [email or web address] °• It is very important to keep all follow up appointments with your surgeon, nutritionist, primary care physician, and behavioral health practitioner °o After the first year, please follow up with your bariatric surgeon and nutritionist at least once a year in order to maintain best weight loss results °      °             [facility]:  [phone] ° °             [facility]: [phone] ° °             Bariatric Nurse Coordinator: [phone]  °Gastric Bypass/Sleeve Home Care Instructions  Rev. 05/2012    ° °                                                    Reviewed and Endorsed °                                                     by [facility] Patient Education Committee, Jan, 2014         Aprepitant Discharge Instructions  On the day of surgery you  were given the medication aprepitant. This medication interacts with hormonal forms of birth control (oral contraceptives and injected or implanted birth control) and may make them ineffective. IF YOU USE ANY HORMONAL FORM OF BIRTH CONTROL, YOU MUST USE AN ADDITIONAL BARRIER BIRTH CONTROL METHOD FOR ONE MONTH after receiving aprepitant or there is a chance you could become pregnant.

## 2016-04-02 NOTE — Interval H&P Note (Signed)
History and Physical Interval Note:  04/02/2016 10:55 AM  [name]  has presented today for surgery, with the diagnosis of Morbid Obesity, DM, GERD  The various methods of treatment have been discussed with the patient and family. After consideration of risks, benefits and other options for treatment, the patient has consented to  Procedure(s): LAPAROSCOPIC GASTRIC SLEEVE RESECTION, UPPER ENDO (N/A) as a surgical intervention .  The patient's history has been reviewed, patient examined, no change in status, stable for surgery.  I have reviewed the patient's chart and labs.  Questions were answered to the patient's satisfaction.     Mei Suits B

## 2016-04-02 NOTE — Op Note (Signed)
Name:  [name] MRN: [id number] Date of Surgery: 04/02/2016  Preop Diagnosis:  Morbid Obesity  Postop Diagnosis:  Morbid Obesity (Weight - 316, BMI - 60.2), S/P Gastric Sleeve  Procedure:  Upper endoscopy  (Intraoperative)  Surgeon:  Alphonsa Overall, M.D.  Anesthesia:  GET  Indications for procedure: [name] is a 40 y.o. female whose primary care physician is Tula Nakayama, MD and has completed a Gastric Sleeve today by Dr. Hassell Done.  I am doing an intraoperative upper endoscopy to evaluate the gastric pouch.  Operative Note: The patient is under general anesthesia.  Dr. Hassell Done is laparoscoping the patient while I do an upper endoscopy to evaluate the stomach pouch.  With the patient intubated, I passed the Olympus upper endoscope without difficulty down the esophagus.  The esophagus was unremarkable.  The esophago-gastric junction was at 38 cm.    The mucosa of the stomach looked viable and the staple line was intact without bleeding.  I advanced to the pylorus, but did not go through it.  While I insufflated the stomach pouch with air, Dr. Hassell Done  flooded the upper abdomen with saline to put the gastric pouch under saline.  There was no bubbling or evidence of a leak.  There was no evidence of narrowing of the pouch and the gastric sleeve looked tubular.  The scope was then withdrawn.  The esophagus was unremarkable and the patient tolerated the endoscopy without difficulty.  Alphonsa Overall, MD, [facility] Surgery Pager: [phone] Office phone:  [phone]

## 2016-04-02 NOTE — Anesthesia Preprocedure Evaluation (Addendum)
Anesthesia Evaluation  Patient identified by MRN, date of birth, ID band Patient awake    Reviewed: Allergy & Precautions, NPO status , Patient's Chart, lab work & pertinent test results  History of Anesthesia Complications (+) PONV  Airway Mallampati: II  TM Distance: >3 FB Neck ROM: Full    Dental  (+) Dental Advisory Given   Pulmonary neg pulmonary ROS,    breath sounds clear to auscultation       Cardiovascular negative cardio ROS   Rhythm:Regular Rate:Normal     Neuro/Psych negative neurological ROS     GI/Hepatic Neg liver ROS, GERD  ,  Endo/Other  diabetes, Type 2, Oral Hypoglycemic AgentsMorbid obesity  Renal/GU negative Renal ROS     Musculoskeletal  (+) Arthritis ,   Abdominal   Peds  Hematology negative hematology ROS (+)   Anesthesia Other Findings   Reproductive/Obstetrics                            Lab Results  Component Value Date   WBC 5.6 03/28/2016   HGB 12.9 03/28/2016   HCT 41.0 03/28/2016   MCV 87.2 03/28/2016   PLT 300 03/28/2016   Lab Results  Component Value Date   CREATININE 0.70 03/28/2016   BUN 16 03/28/2016   NA 139 03/28/2016   K 4.0 03/28/2016   CL 107 03/28/2016   CO2 25 03/28/2016    Anesthesia Physical Anesthesia Plan  ASA: III  Anesthesia Plan: General   Post-op Pain Management:    Induction: Intravenous  Airway Management Planned: Oral ETT  Additional Equipment:   Intra-op Plan:   Post-operative Plan: Extubation in OR  Informed Consent: I have reviewed the patients History and Physical, chart, labs and discussed the procedure including the risks, benefits and alternatives for the proposed anesthesia with the patient or authorized representative who has indicated his/her understanding and acceptance.   Dental advisory given  Plan Discussed with: CRNA  Anesthesia Plan Comments:         Anesthesia Quick  Evaluation

## 2016-04-02 NOTE — Anesthesia Postprocedure Evaluation (Signed)
Anesthesia Post Note  Patient: [name]  Procedure(s) Performed: Procedure(s) (LRB): LAPAROSCOPIC GASTRIC SLEEVE RESECTION, UPPER ENDO (N/A)  Patient location during evaluation: PACU Anesthesia Type: General Level of consciousness: awake and alert Pain management: pain level controlled Vital Signs Assessment: post-procedure vital signs reviewed and stable Respiratory status: spontaneous breathing, nonlabored ventilation, respiratory function stable and patient connected to nasal cannula oxygen Cardiovascular status: blood pressure returned to baseline and stable Postop Assessment: no signs of nausea or vomiting Anesthetic complications: no    Last Vitals:  Vitals:   04/02/16 1500 04/02/16 1527  BP: (!) 138/91 124/90  Pulse: 85 91  Resp: 20 18  Temp: 36.5 C 36.5 C    Last Pain:  Vitals:   04/02/16 1500  TempSrc:   PainSc: 4                  Tiajuana Amass

## 2016-04-03 LAB — CBC WITH DIFFERENTIAL/PLATELET
Basophils Absolute: 0 10*3/uL (ref 0.0–0.1)
Basophils Relative: 0 %
Eosinophils Absolute: 0 10*3/uL (ref 0.0–0.7)
Eosinophils Relative: 0 %
HCT: 40.1 % (ref 36.0–46.0)
Hemoglobin: 13.1 g/dL (ref 12.0–15.0)
LYMPHS ABS: 0.7 10*3/uL (ref 0.7–4.0)
LYMPHS PCT: 8 %
MCH: 27.9 pg (ref 26.0–34.0)
MCHC: 32.7 g/dL (ref 30.0–36.0)
MCV: 85.3 fL (ref 78.0–100.0)
MONO ABS: 0.2 10*3/uL (ref 0.1–1.0)
MONOS PCT: 2 %
Neutro Abs: 7.8 10*3/uL — ABNORMAL HIGH (ref 1.7–7.7)
Neutrophils Relative %: 90 %
PLATELETS: 275 10*3/uL (ref 150–400)
RBC: 4.7 MIL/uL (ref 3.87–5.11)
RDW: 13.7 % (ref 11.5–15.5)
WBC: 8.7 10*3/uL (ref 4.0–10.5)

## 2016-04-03 LAB — HEMOGLOBIN AND HEMATOCRIT, BLOOD
HCT: 38.9 % (ref 36.0–46.0)
Hemoglobin: 12.4 g/dL (ref 12.0–15.0)

## 2016-04-03 NOTE — Consult Note (Signed)
   [facility] CM Inpatient Consult   04/03/2016  REILEIGH HABER 1975/06/27 [id number]   Came to visit Mrs. Toney Rakes on behalf of [employer] to [facility] Care Management program for [facility] employees. She is active with [facility] program for DM management. Went to bedside to discuss ongoing follow up [facility] to [employer]. Contact information left at bedside. Will make [facility] RN Coordinator aware of bedside encounter.    Marthenia Rolling, MSN-Ed, RN,BSN [facility] Liaison [phone]

## 2016-04-03 NOTE — Plan of Care (Signed)
Problem: Food- and Nutrition-Related Knowledge Deficit (NB-1.1) Goal: Nutrition education Formal process to instruct or train a patient/client in a skill or to impart knowledge to help patients/clients voluntarily manage or modify food choices and eating behavior to maintain or improve health. Outcome: Completed/Met Date Met: 04/03/16 Nutrition Education Note  Received consult for diet education per DROP protocol.   Discussed 2 week post op diet with pt. Emphasized that liquids must be non carbonated, non caffeinated, and sugar free. Fluid goals discussed. Reviewed progression of diet to include soft proteins at 7-10 days post-op. Pt to follow up with outpatient bariatric RD for further diet progression after 2 weeks. Multivitamins and minerals also reviewed. Teach back method used, pt expressed understanding, expect good compliance.   Diet: First 2 Weeks  You will see the dietitian about two (2) weeks after your surgery. The dietitian will increase the types of foods you can eat if you are handling liquids well:  If you have severe vomiting or nausea and cannot handle clear liquids lasting longer than 1 day, call your surgeon  Protein Shake  Drink at least 2 ounces of shake 5-6 times per day  Each serving of protein shakes (usually 8 - 12 ounces) should have a minimum of:  15 grams of protein  And no more than 5 grams of carbohydrate  Goal for protein each day:  Men = 80 grams per day  Women = 60 grams per day  Protein powder may be added to fluids such as non-fat milk or Lactaid milk or Soy milk (limit to 35 grams added protein powder per serving)   Hydration  Slowly increase the amount of water and other clear liquids as tolerated (See Acceptable Fluids)  Slowly increase the amount of protein shake as tolerated  Sip fluids slowly and throughout the day  May use sugar substitutes in small amounts (no more than 6 - 8 packets per day; i.e. Splenda)   Fluid Goal  The first goal is to  drink at least 8 ounces of protein shake/drink per day (or as directed by the nutritionist); some examples of protein shakes are Syntrax Nectar, Adkins Advantage, EAS Edge HP, and Unjury. See handout from pre-op Bariatric Education Class:  Slowly increase the amount of protein shake you drink as tolerated  You may find it easier to slowly sip shakes throughout the day  It is important to get your proteins in first  Your fluid goal is to drink 64 - 100 ounces of fluid daily  It may take a few weeks to build up to this  32 oz (or more) should be clear liquids  And  32 oz (or more) should be full liquids (see below for examples)  Liquids should not contain sugar, caffeine, or carbonation   Clear Liquids:  Water or Sugar-free flavored water (i.e. Fruit H2O, Propel)  Decaffeinated coffee or tea (sugar-free)  Crystal Lite, Wyler's Lite, Minute Maid Lite  Sugar-free Jell-O  Bouillon or broth  Sugar-free Popsicle: *Less than 20 calories each; Limit 1 per day   Full Liquids:  Protein Shakes/Drinks + 2 choices per day of other full liquids  Full liquids must be:  No More Than 12 grams of Carbs per serving  No More Than 3 grams of Fat per serving  Strained low-fat cream soup  Non-Fat milk  Fat-free Lactaid Milk  Sugar-free yogurt (Dannon Lite & Fit, Greek yogurt)     Justyce Baby, MS, RD, LDN Pager: [phone] After Hours Pager: [phone]    

## 2016-04-04 LAB — CBC WITH DIFFERENTIAL/PLATELET
Basophils Absolute: 0 10*3/uL (ref 0.0–0.1)
Basophils Relative: 0 %
Eosinophils Absolute: 0 10*3/uL (ref 0.0–0.7)
Eosinophils Relative: 0 %
HEMATOCRIT: 38.5 % (ref 36.0–46.0)
Hemoglobin: 12.2 g/dL (ref 12.0–15.0)
LYMPHS PCT: 28 %
Lymphs Abs: 2.7 10*3/uL (ref 0.7–4.0)
MCH: 27.4 pg (ref 26.0–34.0)
MCHC: 31.7 g/dL (ref 30.0–36.0)
MCV: 86.5 fL (ref 78.0–100.0)
MONO ABS: 0.6 10*3/uL (ref 0.1–1.0)
MONOS PCT: 6 %
NEUTROS ABS: 6.2 10*3/uL (ref 1.7–7.7)
Neutrophils Relative %: 66 %
Platelets: 291 10*3/uL (ref 150–400)
RBC: 4.45 MIL/uL (ref 3.87–5.11)
RDW: 14 % (ref 11.5–15.5)
WBC: 9.6 10*3/uL (ref 4.0–10.5)

## 2016-04-04 MED FILL — oxyCODONE HCL 5 MG/5ML SOLN: 5 | 4 days supply | Qty: 200 | Fill #0

## 2016-04-04 NOTE — Progress Notes (Signed)
Patient ID: [name], female   DOB: 1976/02/03, 40 y.o.   MRN: [id number] [facility] Surgery Progress Note:   2 Days Post-Op  Subjective: Mental status is clear.   Objective: Vital signs in last 24 hours: Temp:  [98.2 F (36.8 C)-98.9 F (37.2 C)] 98.4 F (36.9 C) (12/13 0602) Pulse Rate:  [64-91] 91 (12/13 0602) Resp:  [16-18] 17 (12/13 0602) BP: (106-122)/(61-87) 110/87 (12/13 0602) SpO2:  [96 %-100 %] 98 % (12/13 0602)  Intake/Output from previous day: 12/12 0701 - 12/13 0700 In: 2738.3 [P.O.:360; I.V.:2378.3] Out: 2670 [Urine:2670] Intake/Output this shift: Total I/O In: 20 [P.O.:20] Out: 500 [Urine:500]  Physical Exam: Work of breathing is not labored.  Minimal pain  Lab Results:  Results for orders placed or performed during the hospital encounter of 04/02/16 (from the past 48 hour(s))  Glucose, capillary     Status: Abnormal   Collection Time: 04/02/16  1:26 PM  Result Value Ref Range   Glucose-Capillary 132 (H) 65 - 99 mg/dL   Comment 1 Document in Chart   Hemoglobin and hematocrit, blood     Status: None   Collection Time: 04/02/16  2:14 PM  Result Value Ref Range   Hemoglobin 12.9 12.0 - 15.0 g/dL   HCT 41.0 36.0 - 46.0 %  Glucose, capillary     Status: Abnormal   Collection Time: 04/02/16  4:10 PM  Result Value Ref Range   Glucose-Capillary 113 (H) 65 - 99 mg/dL  CBC     Status: Abnormal   Collection Time: 04/02/16  4:28 PM  Result Value Ref Range   WBC 12.6 (H) 4.0 - 10.5 K/uL   RBC 4.84 3.87 - 5.11 MIL/uL   Hemoglobin 13.6 12.0 - 15.0 g/dL   HCT 41.2 36.0 - 46.0 %   MCV 85.1 78.0 - 100.0 fL   MCH 28.1 26.0 - 34.0 pg   MCHC 33.0 30.0 - 36.0 g/dL   RDW 13.8 11.5 - 15.5 %   Platelets 258 150 - 400 K/uL  Creatinine, serum     Status: None   Collection Time: 04/02/16  4:28 PM  Result Value Ref Range   Creatinine, Ser 0.74 0.44 - 1.00 mg/dL   GFR calc non Af Amer >60 >60 mL/min   GFR calc Af Amer >60 >60 mL/min    Comment: (NOTE) The eGFR  has been calculated using the CKD EPI equation. This calculation has not been validated in all clinical situations. eGFR's persistently <60 mL/min signify possible Chronic Kidney Disease.   Glucose, capillary     Status: Abnormal   Collection Time: 04/02/16  7:55 PM  Result Value Ref Range   Glucose-Capillary 113 (H) 65 - 99 mg/dL  CBC WITH DIFFERENTIAL     Status: Abnormal   Collection Time: 04/03/16  6:32 AM  Result Value Ref Range   WBC 8.7 4.0 - 10.5 K/uL   RBC 4.70 3.87 - 5.11 MIL/uL   Hemoglobin 13.1 12.0 - 15.0 g/dL   HCT 40.1 36.0 - 46.0 %   MCV 85.3 78.0 - 100.0 fL   MCH 27.9 26.0 - 34.0 pg   MCHC 32.7 30.0 - 36.0 g/dL   RDW 13.7 11.5 - 15.5 %   Platelets 275 150 - 400 K/uL   Neutrophils Relative % 90 %   Neutro Abs 7.8 (H) 1.7 - 7.7 K/uL   Lymphocytes Relative 8 %   Lymphs Abs 0.7 0.7 - 4.0 K/uL   Monocytes Relative 2 %  Monocytes Absolute 0.2 0.1 - 1.0 K/uL   Eosinophils Relative 0 %   Eosinophils Absolute 0.0 0.0 - 0.7 K/uL   Basophils Relative 0 %   Basophils Absolute 0.0 0.0 - 0.1 K/uL  Hemoglobin and hematocrit, blood     Status: None   Collection Time: 04/03/16  4:13 PM  Result Value Ref Range   Hemoglobin 12.4 12.0 - 15.0 g/dL   HCT 38.9 36.0 - 46.0 %  CBC with Differential     Status: None   Collection Time: 04/04/16  4:52 AM  Result Value Ref Range   WBC 9.6 4.0 - 10.5 K/uL   RBC 4.45 3.87 - 5.11 MIL/uL   Hemoglobin 12.2 12.0 - 15.0 g/dL   HCT 38.5 36.0 - 46.0 %   MCV 86.5 78.0 - 100.0 fL   MCH 27.4 26.0 - 34.0 pg   MCHC 31.7 30.0 - 36.0 g/dL   RDW 14.0 11.5 - 15.5 %   Platelets 291 150 - 400 K/uL   Neutrophils Relative % 66 %   Neutro Abs 6.2 1.7 - 7.7 K/uL   Lymphocytes Relative 28 %   Lymphs Abs 2.7 0.7 - 4.0 K/uL   Monocytes Relative 6 %   Monocytes Absolute 0.6 0.1 - 1.0 K/uL   Eosinophils Relative 0 %   Eosinophils Absolute 0.0 0.0 - 0.7 K/uL   Basophils Relative 0 %   Basophils Absolute 0.0 0.0 - 0.1 K/uL    Radiology/Results: No  results found.  Anti-infectives: Anti-infectives    Start     Dose/Rate Route Frequency Ordered Stop   04/02/16 0916  cefoTEtan in Dextrose 5% (CEFOTAN) IVPB 2 g     2 g Intravenous On call to O.R. 04/02/16 0916 04/02/16 1150      Assessment/Plan: Problem List: Patient Active Problem List   Diagnosis Date Noted  . 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  . Morbid obesity ([place]) 09/12/2015  . Vitamin B 12 deficiency 11/22/2011  . Vitamin D deficiency 11/22/2011  . B12 deficiency anemia 06/04/2011  . FATIGUE 11/28/2009  . Diabetes mellitus type 2 in obese 09/03/2007  . Iron deficiency anemia 09/03/2007  . ALLERGIC RHINITIS, SEASONAL 09/03/2007  . GERD 09/03/2007    Doing well;  Hopeful discharge today 2 Days Post-Op    LOS: 2 days   Matt B. Hassell Done, MD, [facility] [phone] beeper [phone]  04/04/2016 10:40 AM

## 2016-04-04 NOTE — Progress Notes (Signed)
Patient alert and oriented, pain is controlled. Patient is tolerating fluids, advanced to protein shake today, patient is tolerating well.  Reviewed Gastric sleeve discharge instructions with patient and patient is able to articulate understanding.  Provided information on BELT program, Support Group and [facility] outpatient pharmacy. All questions answered, will continue to monitor.  

## 2016-04-04 NOTE — Discharge Summary (Signed)
Physician Discharge Summary  Patient ID: [name] MRN: [id number] DOB/AGE: 1975/07/11 40 y.o.  Admit date: 04/02/2016 Discharge date: 04/04/2016  Admission Diagnoses:  Morbid obesity  Discharge Diagnoses:  same  Principal Problem:   S/P laparoscopic sleeve gastrectomy Dec 2017 Active Problems:   Morbid obesity [facility])   Surgery:  Sleeve gastrectomy  Discharged Condition: improved  Hospital Course:   Had surgery on Monday. Begun on clear liquids and advanced  Ready for discharge on Wednesday.  Consults: none  Significant Diagnostic Studies: none    Discharge Exam: Blood pressure 110/87, pulse 91, temperature 98.4 F (36.9 C), temperature source Oral, resp. rate 17, height 5\' 2"  (1.575 m), weight (!) 142.2 kg (313 lb 8 oz), last menstrual period 03/21/2016, SpO2 98 %. Incisions OK  Disposition:   Discharge Instructions    Ambulate hourly while awake    Complete by:  As directed    Call MD for:  difficulty breathing, headache or visual disturbances    Complete by:  As directed    Call MD for:  persistant dizziness or light-headedness    Complete by:  As directed    Call MD for:  persistant nausea and vomiting    Complete by:  As directed    Call MD for:  redness, tenderness, or signs of infection (pain, swelling, redness, odor or green/yellow discharge around incision site)    Complete by:  As directed    Call MD for:  severe uncontrolled pain    Complete by:  As directed    Call MD for:  temperature >101 F    Complete by:  As directed    Diet bariatric full liquid    Complete by:  As directed    Incentive spirometry    Complete by:  As directed    Perform hourly while awake       Medication List    TAKE these medications   BYDUREON 2 MG Susr Generic drug:  Exenatide ER INJECT 2MG  INTO THE SKIN ONCE WEEKLY What changed:  See the new instructions.   canagliflozin 300 MG Tabs tablet Commonly known as:  INVOKANA Take 1 tablet (300 mg total) by mouth  daily.   ergocalciferol 50000 units capsule Commonly known as:  VITAMIN D2 Take 1 capsule (50,000 Units total) by mouth once a week. One capsule once weekly What changed:  when to take this  additional instructions   fluticasone 50 MCG/ACT nasal spray Commonly known as:  FLONASE Place 2 sprays into both nostrils daily. What changed:  when to take this  reasons to take this   iron polysaccharides 150 MG capsule Commonly known as:  NU-IRON Take 1 capsule (150 mg total) by mouth daily. What changed:  when to take this   metFORMIN 1000 MG tablet Commonly known as:  GLUCOPHAGE TAKE 1 TABLET BY MOUTH TWICE DAILY WITH A MEAL   omeprazole 40 MG capsule Commonly known as:  PRILOSEC Take 1 capsule (40 mg total) by mouth daily.   phentermine 37.5 MG tablet Commonly known as:  ADIPEX-P Take 1 tablet (37.5 mg total) by mouth daily before breakfast.   vitamin B-12 100 MCG tablet Commonly known as:  CYANOCOBALAMIN Take 100 mcg by mouth daily.      Follow-up Information    Pedro Earls, MD Follow up.   Specialty:  General Surgery Contact information: [address] [place] [address] [phone]           Signed: Pedro Earls 04/04/2016,  10:42 AM

## 2016-04-05 ENCOUNTER — Other Ambulatory Visit: Payer: Self-pay | Admitting: *Deleted

## 2016-04-05 ENCOUNTER — Ambulatory Visit: Payer: Self-pay | Admitting: *Deleted

## 2016-04-05 NOTE — Patient Outreach (Signed)
Reached Jazline via mobile phone number and completed transition of care call. [place] had laprascopic gastric sleeve surgery on 12/11 at [facility] and was discharged on 12/13. No problems identified. See transition of care template for details.    Barrington Ellison RN,CCM,CDE [place] Management Coordinator [facility] Office Phone [phone] Office Fax [phone]

## 2016-04-06 ENCOUNTER — Other Ambulatory Visit: Payer: Self-pay | Admitting: *Deleted

## 2016-04-12 ENCOUNTER — Other Ambulatory Visit: Payer: Self-pay | Admitting: *Deleted

## 2016-04-12 NOTE — Patient Outreach (Signed)
Spoke with [name] to assess ongoing recovery from gastric sleeve surgery on 04/02/16. She states she is doing better as fas as tolerating liquids and says she is able to almost meet her daily fluid intake goal. She says her pain is minimal and she has not required the prescription pain medication for several days. She says her incision stab wound are healing well. She is voiding without problems. She reports her fasting blood sugars as 89, 96 and post meal at 126.  She says she will see Dr. Hassell Done in follow up on 04/27/16 and meet with the registered dietician on 05/18/17.  [name] , this RNCM will call her again next week to assess her ongoing recovery from gastric sleeve surgery.  Jackie Ellison RN,CCM,CDE [place] Management Coordinator [facility] Office Phone [phone] Office Fax [phone]

## 2016-04-17 ENCOUNTER — Encounter: Payer: Self-pay | Admitting: Skilled Nursing Facility1

## 2016-04-17 ENCOUNTER — Encounter: Payer: 59 | Attending: Surgery | Admitting: Skilled Nursing Facility1

## 2016-04-17 DIAGNOSIS — Z713 Dietary counseling and surveillance: Secondary | ICD-10-CM | POA: Diagnosis not present

## 2016-04-17 DIAGNOSIS — E669 Obesity, unspecified: Secondary | ICD-10-CM

## 2016-04-17 DIAGNOSIS — E1169 Type 2 diabetes mellitus with other specified complication: Secondary | ICD-10-CM

## 2016-04-17 NOTE — Progress Notes (Signed)
Bariatric Class:  Appt start time: 1530 end time:  1630.  2 Week Post-Operative Nutrition Class  Patient was seen on 04/17/2016 for Post-[facility]ative [facility]   Surgery date: 04/02/2016 Surgery type: Sleeve gastrectomy Start weight at [facility]: 325 lbs on 01/17/2016 Weight today: 298.2 Wt change (pre-op to today): 18.6  TANITA  BODY COMP RESULTS  03/21/16 04/17/2016   BMI (kg/m^2) 57.9 54.5   Fat Mass (lbs) 175.8 168.4   Fat Free Mass (lbs) 141 129.8   Total Body Water (lbs) 105.8 97.2    The following the learning objectives were met by the patient during this course:  Identifies Phase 3A (Soft, High Proteins) Dietary Goals and will begin from 2 weeks post-operatively to 2 months post-operatively  Identifies appropriate sources of fluids and proteins   States protein recommendations and appropriate sources post-operatively  Identifies the need for appropriate texture modifications, mastication, and bite sizes when consuming solids  Identifies appropriate multivitamin and calcium sources post-operatively  Describes the need for physical activity post-operatively and will follow MD recommendations  States when to call healthcare provider regarding medication questions or post-operative complications  Handouts given during class include:  Phase 3A: Soft, High Protein Diet Handout  Follow-Up Plan: Patient will follow-up at [facility] in 6 weeks for 2 month post-op nutrition visit for diet advancement per MD.

## 2016-04-19 ENCOUNTER — Ambulatory Visit: Payer: Self-pay | Admitting: *Deleted

## 2016-04-20 ENCOUNTER — Other Ambulatory Visit: Payer: Self-pay | Admitting: *Deleted

## 2016-04-20 NOTE — Patient Outreach (Signed)
Spoke with [employer] via phone. She continues to recover uneventfully from her laparoscopic gastric sleeve surgery on 04/02/16. Says she is meeting her daily  liquid intake goal and she has no complaints  Will plan to call next week for final transition of care call. Barrington Ellison RN,CCM,CDE [place] Management Coordinator [facility] Office Phone [phone] Office Fax [phone]

## 2016-04-24 ENCOUNTER — Telehealth (HOSPITAL_COMMUNITY): Payer: Self-pay

## 2016-05-14 ENCOUNTER — Ambulatory Visit (INDEPENDENT_AMBULATORY_CARE_PROVIDER_SITE_OTHER): Payer: 59

## 2016-05-14 ENCOUNTER — Ambulatory Visit (INDEPENDENT_AMBULATORY_CARE_PROVIDER_SITE_OTHER): Payer: 59 | Admitting: Orthopedic Surgery

## 2016-05-14 VITALS — BP 118/74 | HR 75 | Ht 62.5 in | Wt 295.0 lb

## 2016-05-14 DIAGNOSIS — M25511 Pain in right shoulder: Secondary | ICD-10-CM

## 2016-05-14 DIAGNOSIS — M7541 Impingement syndrome of right shoulder: Secondary | ICD-10-CM | POA: Diagnosis not present

## 2016-05-14 NOTE — Patient Instructions (Addendum)
Secondary Shoulder Impingement Syndrome Shoulder impingement syndrome is a condition that causes pain when connective tissues (tendons) surrounding the shoulder joint become pinched. These tendons are part of the group of muscles and tissues that help to stabilize the shoulder (rotator cuff). There are two types of impingement syndrome: primary and secondary. Secondary impingement syndrome occurs when movement of the shoulder joint is abnormal. This can happen if there is too much movement (laxity), too little movement (stiffness), or abnormal movement. What are the causes? This condition may be caused by:  Shoulder blade muscles that are weak or uncoordinated (scapular dyskinesis).  Glenohumeral instability. This is too much movement of the upper arm bone (humerus). This can result from:  Having loose joints.  An injury that happened during repeated overhead arm movements, such as throwing.  A hard, direct hit (blow) to the shoulder. This is rare. What increases the risk? You may be more likely to develop this condition if you have injured your shoulder in the past or if you are an athlete who participates in:  Sports that involve throwing, such as baseball.  Tennis.  Swimming.  Volleyball. What are the signs or symptoms? The main symptom of this condition is pain on the front or side of the shoulder. Pain may:  Get worse when lifting or raising the arm.  Get worse at night.  Wake you up from sleeping.  Feel sharp when the shoulder is moved, and then fade to an ache. Other signs and symptoms may include:  Tenderness.  Stiffness.  Inability to raise the arm above shoulder level or behind the body.  Weakness. How is this diagnosed? This condition may be diagnosed based on:  Your symptoms.  Your medical history.  A physical exam.  Imaging tests, such as:  X-rays.  MRI.  Ultrasound. How is this treated? Treatment for this condition may include:  Resting  your shoulder and avoiding all activities that cause pain or put stress on the shoulder.  Icing your shoulder.  NSAIDs to help reduce pain and swelling.  One or more injections of medicines to numb the area and reduce inflammation.  Physical therapy.  Surgery. This may be needed if nonsurgical treatments do not help. Surgery may involve stabilizing your shoulder and repairing your rotator cuff, as needed. Follow these instructions at home: Managing pain, stiffness, and swelling  If directed, put ice on the injured area.  Put ice in a plastic bag.  Place a towel between your skin and the bag.  Leave the ice on for 20 minutes, 2-3 times a day. Activity  Rest and return to your normal activities as told by your health care provider. Ask your health care provider what activities are safe for you.  Do exercises as told by your health care provider. General instructions  Do not use any tobacco products, including cigarettes, chewing tobacco, or e-cigarettes. Tobacco can delay healing. If you need help quitting, ask your health care provider.  Ask your health care provider when it is safe for you to drive.  Take over-the-counter and prescription medicines only as told by your health care provider.  Keep all follow-up visits as told by your health care provider. This is important. How is this prevented?  Give your body time to rest between periods of activity.  Maintain physical fitness, including strength in your shoulder muscles and back muscles. Contact a health care provider if:  Your symptoms have not improved after 2-3 months of treatment.  Your symptoms are getting worse.  This information is not intended to replace advice given to you by your health care provider. Make sure you discuss any questions you have with your health care provider. Document Released: 04/09/2005 Document Revised: 12/15/2015 Document Reviewed: 03/12/2015 Elsevier Interactive Patient Education   2017 [employer]  Shoulder Range of Motion Exercises Shoulder range of motion (ROM) exercises are designed to keep the shoulder moving freely. They are often recommended for people who have shoulder pain. Phase 1 exercises When you are able, do this exercise 5-6 days per week, or as told by your health care provider. Work toward doing 2 sets of 10 swings. Pendulum Exercise  How To Do This Exercise Lying Down 1. Lie face-down on a bed with your abdomen close to the side of the bed. 2. Let your arm hang over the side of the bed. 3. Relax your shoulder, arm, and hand. 4. Slowly and gently swing your arm forward and back. Do not use your neck muscles to swing your arm. They should be relaxed. If you are struggling to swing your arm, have someone gently swing it for you. When you do this exercise for the first time, swing your arm at a 15 degree angle for 15 seconds, or swing your arm 10 times. As pain lessens over time, increase the angle of the swing to 30-45 degrees. 5. Repeat steps 1-4 with the other arm. How To Do This Exercise While Standing 1. Stand next to a sturdy chair or table and hold on to it with your hand. 1. Bend forward at the waist. 2. Bend your knees slightly. 3. Relax your other arm and let it hang limp. 4. Relax the shoulder blade of the arm that is hanging and let it drop. 5. While keeping your shoulder relaxed, use body motion to swing your arm in small circles. The first time you do this exercise, swing your arm for about 30 seconds or 10 times. When you do it next time, swing your arm for a little longer. 6. Stand up tall and relax. 7. Repeat steps 1-7, this time changing the direction of the circles. 2. Repeat steps 1-8 with the other arm. Phase 2 exercises Do these exercises 3-4 times per day on 5-6 days per week or as told by your health care provider. Work toward holding the stretch for 20 seconds. Stretching Exercise 1 1. Lift your arm straight out in front of  you. 2. Bend your arm 90 degrees at the elbow (right angle) so your forearm goes across your body and looks like the letter "L." 3. Use your other arm to gently pull the elbow forward and across your body. 4. Repeat steps 1-3 with the other arm. Stretching Exercise 2  You will need a towel or rope for this exercise. 1. Bend one arm behind your back with the palm facing outward. 2. Hold a towel with your other hand. 3. Reach the arm that holds the towel above your head, and bend that arm at the elbow. Your wrist should be behind your neck. 4. Use your free hand to grab the free end of the towel. 5. With the higher hand, gently pull the towel up behind you. 6. With the lower hand, pull the towel down behind you. 7. Repeat steps 1-6 with the other arm. Phase 3 exercises Do each of these exercises at four different times of day (sessions) every day or as told by your health care provider. To begin with, repeat each exercise 5 times (repetitions). Work  toward doing 3 sets of 12 repetitions or as told by your health care provider. Strengthening Exercise 1  You will need a light weight for this activity. As you grow stronger, you may use a heavier weight. 1. Standing with a weight in your hand, lift your arm straight out to the side until it is at the same height as your shoulder. 2. Bend your arm at 90 degrees so that your fingers are pointing to the ceiling. 3. Slowly raise your hand until your arm is straight up in the air. 4. Repeat steps 1-3 with the other arm. Strengthening Exercise 2  You will need a light weight for this activity. As you grow stronger, you may use a heavier weight. 1. Standing with a weight in your hand, gradually move your straight arm in an arc, starting at your side, then out in front of you, then straight up over your head. 2. Gradually move your other arm in an arc, starting at your side, then out in front of you, then straight up over your head. 3. Repeat steps 1-2  with the other arm. Strengthening Exercise 3  You will need an elastic band for this activity. As you grow stronger, gradually increase the size of the bands or increase the number of bands that you use at one time. 1. While standing, hold an elastic band in one hand and raise that arm up in the air. 2. With your other hand, pull down the band until that hand is by your side. 3. Repeat steps 1-2 with the other arm. This information is not intended to replace advice given to you by your health care provider. Make sure you discuss any questions you have with your health care provider. Document Released: 01/06/2003 Document Revised: 12/04/2015 Document Reviewed: 04/05/2014 Elsevier Interactive Patient Education  2017 [place].  Secondary Shoulder Impingement Syndrome Shoulder impingement syndrome is a condition that causes pain when connective tissues (tendons) surrounding the shoulder joint become pinched. These tendons are part of the group of muscles and tissues that help to stabilize the shoulder (rotator cuff). There are two types of impingement syndrome: primary and secondary. Secondary impingement syndrome occurs when movement of the shoulder joint is abnormal. This can happen if there is too much movement (laxity), too little movement (stiffness), or abnormal movement. What are the causes? This condition may be caused by:  Shoulder blade muscles that are weak or uncoordinated (scapular dyskinesis).  Glenohumeral instability. This is too much movement of the upper arm bone (humerus). This can result from:  Having loose joints.  An injury that happened during repeated overhead arm movements, such as throwing.  A hard, direct hit (blow) to the shoulder. This is rare. What increases the risk? You may be more likely to develop this condition if you have injured your shoulder in the past or if you are an athlete who participates in:  Sports that involve throwing, such as  baseball.  Tennis.  Swimming.  Volleyball. What are the signs or symptoms? The main symptom of this condition is pain on the front or side of the shoulder. Pain may:  Get worse when lifting or raising the arm.  Get worse at night.  Wake you up from sleeping.  Feel sharp when the shoulder is moved, and then fade to an ache. Other signs and symptoms may include:  Tenderness.  Stiffness.  Inability to raise the arm above shoulder level or behind the body.  Weakness. How is this diagnosed? This condition  may be diagnosed based on:  Your symptoms.  Your medical history.  A physical exam.  Imaging tests, such as:  X-rays.  MRI.  Ultrasound. How is this treated? Treatment for this condition may include:  Resting your shoulder and avoiding all activities that cause pain or put stress on the shoulder.  Icing your shoulder.  NSAIDs to help reduce pain and swelling.  One or more injections of medicines to numb the area and reduce inflammation.  Physical therapy.  Surgery. This may be needed if nonsurgical treatments do not help. Surgery may involve stabilizing your shoulder and repairing your rotator cuff, as needed. Follow these instructions at home: Managing pain, stiffness, and swelling  If directed, put ice on the injured area.  Put ice in a plastic bag.  Place a towel between your skin and the bag.  Leave the ice on for 20 minutes, 2-3 times a day. Activity  Rest and return to your normal activities as told by your health care provider. Ask your health care provider what activities are safe for you.  Do exercises as told by your health care provider. General instructions  Do not use any tobacco products, including cigarettes, chewing tobacco, or e-cigarettes. Tobacco can delay healing. If you need help quitting, ask your health care provider.  Ask your health care provider when it is safe for you to drive.  Take over-the-counter and prescription  medicines only as told by your health care provider.  Keep all follow-up visits as told by your health care provider. This is important. How is this prevented?  Give your body time to rest between periods of activity.  Maintain physical fitness, including strength in your shoulder muscles and back muscles. Contact a health care provider if:  Your symptoms have not improved after 2-3 months of treatment.  Your symptoms are getting worse. This information is not intended to replace advice given to you by your health care provider. Make sure you discuss any questions you have with your health care provider. Document Released: 04/09/2005 Document Revised: 12/15/2015 Document Reviewed: 03/12/2015 Elsevier Interactive Patient Education  2017 [employer]  Shoulder Range of Motion Exercises Shoulder range of motion (ROM) exercises are designed to keep the shoulder moving freely. They are often recommended for people who have shoulder pain. Phase 1 exercises When you are able, do this exercise 5-6 days per week, or as told by your health care provider. Work toward doing 2 sets of 10 swings. Pendulum Exercise  How To Do This Exercise Lying Down 6. Lie face-down on a bed with your abdomen close to the side of the bed. 7. Let your arm hang over the side of the bed. 8. Relax your shoulder, arm, and hand. 9. Slowly and gently swing your arm forward and back. Do not use your neck muscles to swing your arm. They should be relaxed. If you are struggling to swing your arm, have someone gently swing it for you. When you do this exercise for the first time, swing your arm at a 15 degree angle for 15 seconds, or swing your arm 10 times. As pain lessens over time, increase the angle of the swing to 30-45 degrees. 10. Repeat steps 1-4 with the other arm. How To Do This Exercise While Standing 3. Stand next to a sturdy chair or table and hold on to it with your hand. 1. Bend forward at the waist. 2. Bend  your knees slightly. 3. Relax your other arm and let it hang limp.  4. Relax the shoulder blade of the arm that is hanging and let it drop. 5. While keeping your shoulder relaxed, use body motion to swing your arm in small circles. The first time you do this exercise, swing your arm for about 30 seconds or 10 times. When you do it next time, swing your arm for a little longer. 6. Stand up tall and relax. 7. Repeat steps 1-7, this time changing the direction of the circles. 4. Repeat steps 1-8 with the other arm. Phase 2 exercises Do these exercises 3-4 times per day on 5-6 days per week or as told by your health care provider. Work toward holding the stretch for 20 seconds. Stretching Exercise 1 5. Lift your arm straight out in front of you. 6. Bend your arm 90 degrees at the elbow (right angle) so your forearm goes across your body and looks like the letter "L." 7. Use your other arm to gently pull the elbow forward and across your body. 8. Repeat steps 1-3 with the other arm. Stretching Exercise 2  You will need a towel or rope for this exercise. 8. Bend one arm behind your back with the palm facing outward. 9. Hold a towel with your other hand. 10. Reach the arm that holds the towel above your head, and bend that arm at the elbow. Your wrist should be behind your neck. 11. Use your free hand to grab the free end of the towel. 12. With the higher hand, gently pull the towel up behind you. 13. With the lower hand, pull the towel down behind you. 14. Repeat steps 1-6 with the other arm. Phase 3 exercises Do each of these exercises at four different times of day (sessions) every day or as told by your health care provider. To begin with, repeat each exercise 5 times (repetitions). Work toward doing 3 sets of 12 repetitions or as told by your health care provider. Strengthening Exercise 1  You will need a light weight for this activity. As you grow stronger, you may use a heavier  weight. 5. Standing with a weight in your hand, lift your arm straight out to the side until it is at the same height as your shoulder. 6. Bend your arm at 90 degrees so that your fingers are pointing to the ceiling. 7. Slowly raise your hand until your arm is straight up in the air. 8. Repeat steps 1-3 with the other arm. Strengthening Exercise 2  You will need a light weight for this activity. As you grow stronger, you may use a heavier weight. 4. Standing with a weight in your hand, gradually move your straight arm in an arc, starting at your side, then out in front of you, then straight up over your head. 5. Gradually move your other arm in an arc, starting at your side, then out in front of you, then straight up over your head. 6. Repeat steps 1-2 with the other arm. Strengthening Exercise 3  You will need an elastic band for this activity. As you grow stronger, gradually increase the size of the bands or increase the number of bands that you use at one time. 4. While standing, hold an elastic band in one hand and raise that arm up in the air. 5. With your other hand, pull down the band until that hand is by your side. 6. Repeat steps 1-2 with the other arm. This information is not intended to replace advice given to you by your health care  provider. Make sure you discuss any questions you have with your health care provider. Document Released: 01/06/2003 Document Revised: 12/04/2015 Document Reviewed: 04/05/2014 Elsevier Interactive Patient Education  2017 [employer].

## 2016-05-14 NOTE — Progress Notes (Signed)
Patient ID: EVIANNA SPOO, female   DOB: [birth year], 41 y.o.   MRN: [id number]  Chief Complaint  Patient presents with  . Shoulder Pain    Right shoulder pain, no injury.    HPI [name] is a 41 y.o. female.  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  Neurological: Negative for numbness.    Past Medical History:  Diagnosis Date  . Allergy   . Anemia   . Arthritis   . Diabetes mellitus    type II   . GERD (gastroesophageal reflux disease)   . PONV (postoperative nausea and vomiting)     Past Surgical History:  Procedure Laterality Date  . CARDIAC CATHETERIZATION    . CESAREAN SECTION     x 2  . 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 Hypertension Mother   . COPD Father   . Diabetes Father   . Heart disease Father   . Hypertension Father   . Kidney disease Father   . Stroke Father     Social History Social History  Substance Use Topics  . Smoking status: Never Smoker  . Smokeless tobacco: Never Used  . Alcohol use Yes     Comment: occassional    No Known Allergies  Current Outpatient Prescriptions  Medication Sig Dispense Refill  . BYDUREON 2 MG SUSR INJECT 2MG  INTO THE SKIN ONCE WEEKLY (Patient taking differently: INJECT 2MG  INTO THE SKIN ONCE A WEEK ON SUNDAYS) 12 each 3  . canagliflozin (INVOKANA) 300 MG TABS tablet Take 1 tablet (300 mg total) by mouth daily. 90 tablet 1  . ergocalciferol (VITAMIN D2) 50000 UNITS capsule Take 1 capsule (50,000  Units total) by mouth once a week. One capsule once weekly (Patient taking differently: Take 50,000 Units by mouth every Sunday. One capsule once weekly) 12 capsule 1  . fluticasone (FLONASE) 50 MCG/ACT nasal spray Place 2 sprays into both nostrils daily. (Patient taking differently: Place 2 sprays into both nostrils daily as needed (for allergies/congestion). ) 48 g 3  . iron polysaccharides (NU-IRON) 150 MG capsule Take 1 capsule (150 mg total) by mouth daily. (Patient taking differently: Take 150 mg by mouth every Monday, Wednesday, and Friday. ) 90 capsule 3  . metFORMIN (GLUCOPHAGE) 1000 MG tablet TAKE 1 TABLET BY MOUTH TWICE DAILY WITH A MEAL 180 tablet 3  . omeprazole (PRILOSEC) 40 MG capsule Take 1 capsule (40 mg total) by mouth daily. 90 capsule 1  . phentermine (ADIPEX-P) 37.5 MG tablet Take 1 tablet (37.5 mg total) by mouth daily before breakfast. 60 tablet 1  . vitamin B-12 (CYANOCOBALAMIN) 100 MCG tablet Take 100 mcg by mouth daily.     No current facility-administered medications for this visit.        Physical Exam BP 118/74   Pulse 75   Ht 5' 2.5" (1.588 m)   Wt 295 lb (133.8 kg)   BMI 53.10 kg/m  Physical Exam  Constitutional: She is oriented to person, place, and  time. She appears well-developed and well-nourished. No distress.  Cardiovascular: Normal rate and intact distal pulses.   Neurological: She is alert and oriented to person, place, and time. She has normal reflexes. She exhibits normal muscle tone. Coordination normal.  Skin: Skin is warm and dry. No rash noted. She is not diaphoretic. No erythema. No pallor.  Psychiatric: She has a normal mood and affect. Her behavior is normal. Judgment and thought content normal.   Ambulatory status normal with no assistive devices Right Shoulder Exam   Tenderness  The patient is experiencing tenderness in the acromion.  Range of Motion  Active Abduction: normal  Passive Abduction: normal  Extension: normal  Forward  Flexion: abnormal  External Rotation: normal  Internal Rotation 0 degrees:  L5 abnormal   Muscle Strength  Abduction: 5/5  Internal Rotation: 5/5  External Rotation: 5/5  Supraspinatus: 5/5  Subscapularis: 5/5  Biceps: 5/5   Tests  Apprehension: negative Cross Arm: negative Drop Arm: negative Impingement: positive Sulcus: absent  Other  Erythema: absent Scars: absent Sensation: normal Pulse: present   Left Shoulder Exam  Left shoulder exam is normal.  Tenderness  The patient is experiencing no tenderness.     Range of Motion  The patient has normal left shoulder ROM.  Muscle Strength  The patient has normal left shoulder strength.  Tests  Apprehension: negative  Other  Erythema: absent Sensation: normal Pulse: present        Data Reviewed  Imaging of the RIGHTshoulder are independently reviewed and I interpreted these as NORMAL WITH TYPE 1 ACROMION   Assessment  Encounter Diagnosis  Name Primary?  . Pain in joint of right shoulder Yes      Plan  HEP MOTRIN  Procedure note the subacromial injection shoulder RIGHT  Verbal consent was obtained to inject the  RIGHT   Shoulder  Timeout was completed to confirm the injection site is a subacromial space of the  RIGHT  shoulder   Medication used Depo-Medrol 40 mg and lidocaine 1% 3 cc  Anesthesia was provided by ethyl chloride  The injection was performed in the RIGHT  posterior subacromial space. After pinning the skin with alcohol and anesthetized the skin with ethyl chloride the subacromial space was injected using a 20-gauge needle. There were no complications  Sterile dressing was applied.

## 2016-05-15 ENCOUNTER — Ambulatory Visit (INDEPENDENT_AMBULATORY_CARE_PROVIDER_SITE_OTHER): Payer: 59 | Admitting: Family Medicine

## 2016-05-15 ENCOUNTER — Encounter: Payer: Self-pay | Admitting: Family Medicine

## 2016-05-15 VITALS — BP 118/78 | HR 59 | Resp 16 | Ht 60.0 in | Wt 300.0 lb

## 2016-05-15 DIAGNOSIS — E1169 Type 2 diabetes mellitus with other specified complication: Secondary | ICD-10-CM | POA: Diagnosis not present

## 2016-05-15 DIAGNOSIS — D509 Iron deficiency anemia, unspecified: Secondary | ICD-10-CM

## 2016-05-15 DIAGNOSIS — D519 Vitamin B12 deficiency anemia, unspecified: Secondary | ICD-10-CM

## 2016-05-15 DIAGNOSIS — E559 Vitamin D deficiency, unspecified: Secondary | ICD-10-CM

## 2016-05-15 DIAGNOSIS — E669 Obesity, unspecified: Secondary | ICD-10-CM

## 2016-05-15 DIAGNOSIS — E538 Deficiency of other specified B group vitamins: Secondary | ICD-10-CM

## 2016-05-15 DIAGNOSIS — K219 Gastro-esophageal reflux disease without esophagitis: Secondary | ICD-10-CM

## 2016-05-15 NOTE — Patient Instructions (Signed)
F/u in 3 month, call if you need me sooner  ONLY metformin 500 mg once or twice daily for blood sugar ( break 1000 mg tab in half)  teset once every day  Goal for fasting blood sugar ranges from 80 to 120 and 2 hours after any meal or at bedtime should be between 130 to 170.  It is important that you exercise regularly at least 30 minutes 5 times a week. If you develop chest pain, have severe difficulty breathing, or feel very tired, stop exercising immediately and seek medical attention    Non fast cbc, iron B12, HBA1c vit D cmp and eGFR in 3 months  Thankful surgery success, keep I on track !!!  Thank you  for choosing [facility]. We consider it a privelige to serve you.  Delivering excellent health care in a caring and  compassionate way is our goal.  Partnering with you,  so that together we can achieve this goal is our strategy.

## 2016-05-16 NOTE — Assessment & Plan Note (Signed)
Normal blood sugar pre gastric bypass on prior meds, has been using reduced medication due to blood sugar lows post op. Metformin only. [name] is reminded of the importance of commitment to daily physical activity for 30 minutes or more, as able and the need to limit carbohydrate intake to 30 to 60 grams per meal to help with blood sugar control.   The need to take medication as prescribed, test blood sugar as directed, and to call between visits if there is a concern that blood sugar is uncontrolled is also discussed.   [name] is reminded of the importance of daily foot exam, annual eye examination, and good blood sugar, blood pressure and cholesterol control.  Diabetic Labs Latest Ref Rng & Units 04/02/2016 03/28/2016 01/19/2016 09/12/2015 03/28/2015  HbA1c 4.8 - 5.6 % - 5.8(H) 5.9 6.0(H) 5.7  Microalbumin Not estab mg/dL - - - 0.4 -  Micro/Creat Ratio <30 mcg/mg creat - - - 3 -  Chol 125 - 200 mg/dL - - - 150 -  HDL >=46 mg/dL - - - 64 -  Calc LDL mg/dL - - 83 74 -  Triglycerides <150 mg/dL - - - 58 -  Creatinine 0.44 - 1.00 mg/dL 0.74 0.70 - 0.75 -   BP/Weight 05/15/2016 05/14/2016 04/17/2016 04/04/2016 04/02/2016 03/28/2016 [id number]  Systolic BP 123456 123456 - 123456 - [id number] -  Diastolic BP 78 74 - 64 - 49 -  Wt. (Lbs) 300 295 298.2 - 313.5 310 316.8  BMI 58.59 53.1 54.54 - 57.34 56.7 57.94   Foot/eye exam completion dates Latest Ref Rng & Units 09/12/2015 07/05/2014  Eye Exam No Retinopathy - -  Foot Form Completion - Done Done      Updated lab needed at/ before next visit.

## 2016-05-16 NOTE — Progress Notes (Signed)
[name]     MRN: [id number]      DOB: Jun 08, 1975   HPI [name] is here for follow up and re-evaluation of chronic medical conditions, medication management and review of any available recent lab and radiology data.  Preventive health is updated, specifically  Cancer screening and Immunization.   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  Right shoulder pain wiith reduced mobility being managed by orthopedics Needs to commit to more regular exercise  ROS Denies recent fever or chills. Denies sinus pressure, nasal congestion, ear pain or sore throat. Denies chest congestion, productive cough or wheezing. Denies chest pains, palpitations and leg swelling Denies abdominal pain, nausea, vomiting,diarrhea or constipation.   Denies dysuria, frequency, hesitancy or incontinence.  Denies headaches, seizures, numbness, or tingling. Denies depression, anxiety or insomnia. Denies skin break down or rash.   PE  BP 118/78   Pulse (!) 59   Resp 16   Ht 5' (1.524 m)   Wt 300 lb (136.1 kg)   SpO2 100%   BMI 58.59 kg/m   Patient alert and oriented and in no cardiopulmonary distress.  HEENT: No facial asymmetry, EOMI,   oropharynx pink and moist.  Neck supple no JVD, no mass.  Chest: Clear to auscultation bilaterally.  CVS: S1, S2 no murmurs, no S3.Regular rate.  ABD: Soft non tender.   Ext: No edema  MS: Adequate ROM spine, , hips and knees.decreased in right shoulder  Skin: Intact, no ulcerations or rash noted.  Psych: Good eye contact, normal affect. Memory intact not anxious or depressed appearing.  CNS: CN 2-12 intact, power,  normal throughout.no focal deficits noted.   Assessment & Plan  Diabetes  mellitus type 2 in obese Normal blood sugar pre gastric bypass on prior meds, has been using reduced medication due to blood sugar lows post op. Metformin only. [name] is reminded of the importance of commitment to daily physical activity for 30 minutes or more, as able and the need to limit carbohydrate intake to 30 to 60 grams per meal to help with blood sugar control.   The need to take medication as prescribed, test blood sugar as directed, and to call between visits if there is a concern that blood sugar is uncontrolled is also discussed.   [name] is reminded of the importance of daily foot exam, annual eye examination, and good blood sugar, blood pressure and cholesterol control.  Diabetic Labs Latest Ref Rng & Units 04/02/2016 03/28/2016 01/19/2016 09/12/2015 03/28/2015  HbA1c 4.8 - 5.6 % - 5.8(H) 5.9 6.0(H) 5.7  Microalbumin Not estab mg/dL - - - 0.4 -  Micro/Creat Ratio <30 mcg/mg creat - - - 3 -  Chol 125 - 200 mg/dL - - - 150 -  HDL >=46 mg/dL - - - 64 -  Calc LDL mg/dL - - 83 74 -  Triglycerides <150 mg/dL - - - 58 -  Creatinine 0.44 - 1.00 mg/dL 0.74 0.70 - 0.75 -   BP/Weight 05/15/2016 05/14/2016 04/17/2016 04/04/2016 04/02/2016 03/28/2016 [id number]  Systolic BP 123456 123456 - 123456 - [id number] -  Diastolic BP 78 74 - 64 - 49 -  Wt. (Lbs) 300 295 298.2 - 313.5 310 316.8  BMI 58.59 53.1 54.54 - 57.34  56.7 57.94   Foot/eye exam completion dates Latest Ref Rng & Units 09/12/2015 07/05/2014  Eye Exam No Retinopathy - -  Foot Form Completion - Done Done      Updated lab needed at/ before next visit.   GERD Controlled, no change in medication   Morbid obesity ([place]) improved Patient re-educated about  the importance of commitment to a  minimum of 150 minutes of exercise per week.  The importance of healthy food choices with portion control discussed. Encouraged to start a food diary, count calories and to consider  joining a support group. Sample diet sheets offered. Goals set  by the patient for the next several months.   Weight /BMI 05/15/2016 05/14/2016 04/17/2016  WEIGHT 300 lb 295 lb 298 lb 3.2 oz  HEIGHT 5\' 0"  5' 2.5" 5\' 2"   BMI 58.59 kg/m2 53.1 kg/m2 54.54 kg/m2      Vitamin D deficiency Updated lab needed at/ before next visit.   Vitamin B 12 deficiency Updated lab needed at/ before next visit.   Iron deficiency anemia Updated lab needed at/ before next visit.   FATIGUE Commitment to daily exercise encouraged  Allergic rhinitis Controlled, no change in medication

## 2016-05-16 NOTE — Assessment & Plan Note (Signed)
Updated lab needed at/ before next visit.   

## 2016-05-16 NOTE — Assessment & Plan Note (Signed)
Controlled, no change in medication  

## 2016-05-16 NOTE — Assessment & Plan Note (Signed)
Commitment to daily exercise encouraged

## 2016-05-16 NOTE — Assessment & Plan Note (Signed)
improved Patient re-educated about  the importance of commitment to a  minimum of 150 minutes of exercise per week.  The importance of healthy food choices with portion control discussed. Encouraged to start a food diary, count calories and to consider  joining a support group. Sample diet sheets offered. Goals set by the patient for the next several months.   Weight /BMI 05/15/2016 05/14/2016 04/17/2016  WEIGHT 300 lb 295 lb 298 lb 3.2 oz  HEIGHT 5\' 0"  5' 2.5" 5\' 2"   BMI 58.59 kg/m2 53.1 kg/m2 54.54 kg/m2

## 2016-05-22 MED FILL — metFORMIN HCL 1000 MG TABS: 1000 | 90 days supply | Qty: 180 | Fill #3

## 2016-05-31 ENCOUNTER — Encounter: Payer: Self-pay | Admitting: Skilled Nursing Facility1

## 2016-05-31 ENCOUNTER — Encounter: Payer: 59 | Attending: Surgery | Admitting: Skilled Nursing Facility1

## 2016-05-31 DIAGNOSIS — E1169 Type 2 diabetes mellitus with other specified complication: Secondary | ICD-10-CM

## 2016-05-31 DIAGNOSIS — Z713 Dietary counseling and surveillance: Secondary | ICD-10-CM | POA: Insufficient documentation

## 2016-05-31 DIAGNOSIS — E669 Obesity, unspecified: Secondary | ICD-10-CM

## 2016-05-31 NOTE — Patient Instructions (Addendum)
-  When you get your multi try capsules   -Keep working on getting your calcium and multivitamin every day  -Bites the size of a dime, chew until applesauce consistency, moisten your meats  -Focus on getting your protein from solid foods and wean off the protein using them as plan B  -Always eat the protein first   -[employer] Protein Chips  -Keep up the physical activity it is great!!

## 2016-05-31 NOTE — Progress Notes (Signed)
  Follow-up visit:  8 Weeks Post-Operative Sleeve Gastrectomy Surgery  Medical Nutrition Therapy:  Appt start time: 8:50 end time:  9:21  Primary concerns today: Post-operative Bariatric Surgery Nutrition Management. Pt states meat bothers her stomach: cramping feeling. Pt states she has tried broccoli and tolerated it. Pt states she is currently only taking metformin 2 times a day. Pt states she experienced a low of 64 with symptoms: headache and shaky. Pt states he will start with a personal trainer soon.  Surgery date: 04/02/2016 Surgery type: Sleeve gastrectomy Start weight at [facility]: 325 lbs on 01/17/2016 Weight today: 286.6 Wt change:  11.6  TANITA  BODY COMP RESULTS  03/21/16 04/17/2016 05/31/2016   BMI (kg/m^2) 57.9 54.5 52.4   Fat Mass (lbs) 175.8 168.4 155.6   Fat Free Mass (lbs) 141 129.8 131   Total Body Water (lbs) 105.8 97.2 97.6    Preferred Learning Style:   No preference indicated   Learning Readiness:   Change in progress  24-hr recall: B (AM): protein shake 30 grams pro) or 1 egg Snk (AM):  L (PM): grilled chicken or fish or hamburger patty 1-2 ounces 7-14 grams protien Snk (PM): sugar free jello D (PM): same as lunch OR protein shake Snk (PM):   Fluid intake: 32 ounces water, 22 ounces protein shake: 54 ounces  Estimated total protein intake: 74 grams of protein to 58  Medications: See List  Supplementation: Most of the time: calcium and multi  CBG monitoring: 2 times a day Average CBG per patient: fasting 115, 80, 130: averaging 90-110 Last patient reported A1c: 5.7  Using straws: YES Drinking while eating: NO Hair loss: NO Carbonated beverages: YES; gingerale N/V/D/C: YES; when drank gingerale, NO, NO, NO  Recent physical activity:  Hip hop zumba: 3 days a week 45 minutes   Progress Towards Goal(s):  In progress.  Handouts given during visit include:  NS veggies + protien   Nutritional Diagnosis:  [place]-3.3 Overweight/obesity related to  past poor dietary habits and physical inactivity as evidenced by patient w/ recent sleeve gastrectomy surgery following dietary guidelines for continued weight loss.    Intervention:  Nutrition counseling. Dietitian educated the pt on advancing her diet to include non-starchy vegetables  -When you get your multi try capsules  -Keep working on getting your calcium and multivitamin every day -Bites the size of a dime, chew until applesauce consistency, moisten your meats -Focus on getting your protein from solid foods and wean off the protein using them as plan B -Always eat the protein first  -[employer] Protein Chips -Keep up the physical activity it is great!!  Teaching Method Utilized:  Visual Auditory Hands on  Barriers to learning/adherence to lifestyle change: none identified   Demonstrated degree of understanding via:  Teach Back   Monitoring/Evaluation:  Dietary intake, exercise, lap band fills, and body weight.

## 2016-07-05 MED FILL — OMEPRAZOLE DR 40 MG CAPSULE: 40 | 90 days supply | Qty: 90 | Fill #1

## 2016-08-01 ENCOUNTER — Encounter: Payer: 59 | Attending: Surgery | Admitting: Skilled Nursing Facility1

## 2016-08-01 ENCOUNTER — Encounter: Payer: Self-pay | Admitting: Skilled Nursing Facility1

## 2016-08-01 DIAGNOSIS — E119 Type 2 diabetes mellitus without complications: Secondary | ICD-10-CM

## 2016-08-01 DIAGNOSIS — Z713 Dietary counseling and surveillance: Secondary | ICD-10-CM | POA: Diagnosis not present

## 2016-08-01 NOTE — Progress Notes (Signed)
  Follow-up visit:  8 Weeks Post-Operative Sleeve Gastrectomy Surgery  Medical Nutrition Therapy:  Appt start time: 8:50 end time:  9:21  Primary concerns today: Post-operative Bariatric Surgery Nutrition Management.  Pt arrived seeming reserved and not saying much. Pt states she is okay with her weight loss so far and sees the difference in the inches she has lost.  Surgery date: 04/02/2016 Surgery type: Sleeve gastrectomy Start weight at [facility]: 325 lbs on 01/17/2016 Weight today: 284.6 Wt change:  2lbs  TANITA  BODY COMP RESULTS  03/21/16 04/17/2016 05/31/2016 08/01/2016   BMI (kg/m^2) 57.9 54.5 52.4    Fat Mass (lbs) 175.8 168.4 155.6 164.8   Fat Free Mass (lbs) 141 129.8 131 119.8   Total Body Water (lbs) 105.8 97.2 97.6 89.8    Preferred Learning Style:   No preference indicated   Learning Readiness:   Change in progress  24-hr recall: B (AM): protein shake (30 grams pro) or 1 egg and 1 small piece of sausage Snk (AM): fruit L (PM): grilled chicken or fish or hamburger patty 1-2 ounces 7-14 grams protein with a vegetable Snk (PM): sugar free jello----cheese and crackers D (PM): same as lunch OR fried chicken wings Snk (PM): gold fish or potato chips  Fluid intake: 50 ounces water,  juices 16 ounces Estimated total protein intake: 74 grams of protein to 58  Medications: See List  Supplementation: A struggle to remember to take it  CBG monitoring: 1 times a day Average CBG per patient: fasting 95-120 Last patient reported A1c: 5.7  Using straws: YES Drinking while eating: NO Hair loss: NO Carbonated beverages: NO N/V/D/C: YES; when drank gingerale, sometimes diarrhea with fried foods Having you been chewing well: yes Chewing/swallowing difficulties: no Changes in vision: no Changes to mood/headaches: no Hair loss/Cahnges to skin/Changes to nails: no Any difficulty focusing or concentrating: no Sweating: no Dizziness/Lightheaded: no  Palpitations:  no Abdominal Pain: no Recent physical activity:  Hip hop zumba: 3 days a week 45 minutes started with personal trainer 2 times a week   Progress Towards Goal(s):  In progress.  Handouts given during visit include:  NS veggies + protien   Nutritional Diagnosis:  [place]-3.3 Overweight/obesity related to past poor dietary habits and physical inactivity as evidenced by patient w/ recent sleeve gastrectomy surgery following dietary guidelines for continued weight loss.    Intervention:  Nutrition counseling. Dietitian educated the pt on advancing her diet to include non-starchy vegetables  Goals: -When you eat chicken wings take the skin off -Try to stear clear of juice: the sugar free, calorie free drinks are fine -Try tums for your calcium -Try a specific multivitamin capsule  -[employer] Protein Chips -Dry roasted edamame  Teaching Method Utilized:  Visual Auditory Hands on  Barriers to learning/adherence to lifestyle change: none identified   Demonstrated degree of understanding via:  Teach Back   Monitoring/Evaluation:  Dietary intake, exercise, lap band fills, and body weight.

## 2016-08-01 NOTE — Patient Instructions (Addendum)
-  When you eat chicken wings take the skin off  -Try to stear clear of juice: the sugar free, calorie free drinks are fine  -Try tums for your calcium  -Try a specific multivitamin capsule   -[employer] Protein Chips  -Dry roasted edamame

## 2016-08-10 DIAGNOSIS — E1169 Type 2 diabetes mellitus with other specified complication: Secondary | ICD-10-CM | POA: Diagnosis not present

## 2016-08-10 DIAGNOSIS — E669 Obesity, unspecified: Secondary | ICD-10-CM | POA: Diagnosis not present

## 2016-08-10 DIAGNOSIS — E559 Vitamin D deficiency, unspecified: Secondary | ICD-10-CM | POA: Diagnosis not present

## 2016-08-10 DIAGNOSIS — D519 Vitamin B12 deficiency anemia, unspecified: Secondary | ICD-10-CM | POA: Diagnosis not present

## 2016-08-10 LAB — COMPLETE METABOLIC PANEL WITH GFR
ALT: 5 U/L — ABNORMAL LOW (ref 6–29)
AST: 9 U/L — AB (ref 10–30)
Albumin: 3.8 g/dL (ref 3.6–5.1)
Alkaline Phosphatase: 54 U/L (ref 33–115)
BUN: 10 mg/dL (ref 7–25)
CHLORIDE: 106 mmol/L (ref 98–110)
CO2: 26 mmol/L (ref 20–31)
Calcium: 9.1 mg/dL (ref 8.6–10.2)
Creat: 0.71 mg/dL (ref 0.50–1.10)
GFR, Est African American: >89 mL/min (ref >=60)
GLUCOSE: 92 mg/dL (ref 65–99)
POTASSIUM: 4.1 mmol/L (ref 3.5–5.3)
SODIUM: 139 mmol/L (ref 135–146)
Total Bilirubin: 0.4 mg/dL (ref 0.2–1.2)
Total Protein: 6.3 g/dL (ref 6.1–8.1)

## 2016-08-10 LAB — VITAMIN B12: Vitamin B-12: 269 pg/mL (ref 200–1100)

## 2016-08-10 LAB — CBC
HEMATOCRIT: 37.8 % (ref 35.0–45.0)
HEMOGLOBIN: 12 g/dL (ref 11.7–15.5)
MCH: 28.8 pg (ref 27.0–33.0)
MCHC: 31.7 g/dL — AB (ref 32.0–36.0)
MCV: 90.6 fL (ref 80.0–100.0)
MPV: 11 fL (ref 7.5–12.5)
Platelets: 250 10*3/uL (ref 140–400)
RBC: 4.17 MIL/uL (ref 3.80–5.10)
RDW: 13.7 % (ref 11.0–15.0)
WBC: 5.3 10*3/uL (ref 3.8–10.8)

## 2016-08-10 LAB — IRON: IRON: 46 ug/dL (ref 40–190)

## 2016-08-11 LAB — VITAMIN D 25 HYDROXY (VIT D DEFICIENCY, FRACTURES): VIT D 25 HYDROXY: 16 ng/mL — AB (ref 30–100)

## 2016-08-11 LAB — HEMOGLOBIN A1C
HEMOGLOBIN A1C: 5.5 % (ref <5.7)
MEAN PLASMA GLUCOSE: 111 mg/dL

## 2016-08-15 ENCOUNTER — Ambulatory Visit (INDEPENDENT_AMBULATORY_CARE_PROVIDER_SITE_OTHER): Payer: 59 | Admitting: Family Medicine

## 2016-08-15 ENCOUNTER — Encounter: Payer: Self-pay | Admitting: Family Medicine

## 2016-08-15 VITALS — BP 118/78 | HR 87 | Resp 16 | Ht 62.0 in | Wt 280.1 lb

## 2016-08-15 DIAGNOSIS — D509 Iron deficiency anemia, unspecified: Secondary | ICD-10-CM | POA: Diagnosis not present

## 2016-08-15 DIAGNOSIS — E669 Obesity, unspecified: Secondary | ICD-10-CM | POA: Diagnosis not present

## 2016-08-15 DIAGNOSIS — G8929 Other chronic pain: Secondary | ICD-10-CM | POA: Diagnosis not present

## 2016-08-15 DIAGNOSIS — E1169 Type 2 diabetes mellitus with other specified complication: Secondary | ICD-10-CM

## 2016-08-15 DIAGNOSIS — E559 Vitamin D deficiency, unspecified: Secondary | ICD-10-CM | POA: Diagnosis not present

## 2016-08-15 DIAGNOSIS — M25511 Pain in right shoulder: Secondary | ICD-10-CM

## 2016-08-15 MED ORDER — METFORMIN HCL ER 500 MG PO TB24: 500.0000 mg | ORAL_TABLET | Freq: Every day | ORAL | 1 refills | Status: DC

## 2016-08-15 MED ORDER — PANTOPRAZOLE SODIUM 40 MG PO TBEC: 40.0000 mg | DELAYED_RELEASE_TABLET | Freq: Every day | ORAL | 3 refills | Status: DC

## 2016-08-15 MED ORDER — VITAMIN D3 1.25 MG (50000 UT) PO CAPS: 50000.0000 [IU] | ORAL_CAPSULE | ORAL | 1 refills | Status: DC

## 2016-08-15 MED FILL — PANTOPRAZOLE SOD DR 40 MG T: 40 | 90 days supply | Qty: 90 | Fill #0

## 2016-08-15 MED FILL — METFORMIN HCL ER 500 MG TAB: 500 | 90 days supply | Qty: 90 | Fill #0

## 2016-08-15 MED FILL — VIT D3-50 50,000 UNITS CAPS: 1.25 MG | 84 days supply | Qty: 12 | Fill #0

## 2016-08-15 NOTE — Patient Instructions (Addendum)
f/u in 4 month, call if you need me before  Reduce metformin to 500 mg one daily  Weight loss goal of 20 pounds  It is important that you exercise regularly at least 40 minutes5 times a week. If you develop chest pain, have severe difficulty breathing, or feel very tired, stop exercising immediately and seek medical attention    Fasting lipid, chem 7 and EGFr, hBA1Cand tSH in 4 month  You are referred for MRI of right shoulder due to continued pain rated at an 8 and limiting activity also awakening you  Start once weekly vit D3 for 6 months, then oTC once daily vit D3 2000 IU   Once daily testing supplies will be sent  Thank you  for choosing [facility]. We consider it a privelige to serve you.  Delivering excellent health care in a caring and  compassionate way is our goal.  Partnering with you,  so that together we can achieve this goal is our strategy.

## 2016-08-15 NOTE — Assessment & Plan Note (Addendum)
6 month history, no improvement , debilitating rated from from 8 to 10, X ray normal, pain limits activity  and awakens patient will get MRI

## 2016-08-17 NOTE — Progress Notes (Signed)
[name]     MRN: [id number]      DOB: Jan 01, 1976   HPI [name] is here for follow up and re-evaluation of chronic medical conditions, medication management and review of any available recent lab and radiology data.  Preventive health is updated, specifically  Cancer screening and Immunization.   Questions or concerns regarding consultations or procedures which the PT has had in the interim are  addressed. The PT denies any adverse reactions to current medications since the last visit.  Still c/o right shoulder pain rated at an 8 to 10 following in home exercises 4 months after ortho eval, pain limits shoulder movement and awakens her , wants help Involved in regular exercise program and wellness program doing very well with weight loss and improved health , needs test supplies to remain in program, often takes one not two metformin daily, no HBa1C is normal!  ROS Denies recent fever or chills. Denies sinus pressure, nasal congestion, ear pain or sore throat. Denies chest congestion, productive cough or wheezing. Denies chest pains, palpitations and leg swelling Denies abdominal pain, nausea, vomiting,diarrhea or constipation.   Denies dysuria, frequency, hesitancy or incontinence. . Denies headaches, seizures, numbness, or tingling. Denies depression, anxiety or insomnia. Denies skin break down or rash.   PE  BP 118/78   Pulse 87   Resp 16   Ht 5\' 2"  (1.575 m)   Wt 280 lb 1.9 oz (127.1 kg)   SpO2 98%   BMI 51.23 kg/m   Patient alert and oriented and in no cardiopulmonary distress.  HEENT: No facial asymmetry, EOMI,   oropharynx pink and moist.  Neck supple no JVD, no mass.  Chest: Clear to auscultation bilaterally.  CVS: S1, S2 no murmurs, no S3.Regular rate.  ABD: Soft non tender.   Ext: No edema  MS: Adequate ROM spine,, hips and knees.decreased rOM right shoulder  Skin: Intact, no ulcerations or rash noted.  Psych: Good eye contact, normal affect.  Memory intact not anxious or depressed appearing.  CNS: CN 2-12 intact, power,  normal throughout.no focal deficits noted.   Assessment & Plan  Chronic right shoulder pain 6 month history, no improvement , debilitating rated from from 8 to 10, X ray normal, pain limits activity  and awakens patient will get MRI   Diabetes mellitus type 2 in obese [name] is reminded of the importance of commitment to daily physical activity for 30 minutes or more, as able and the need to limit carbohydrate intake to 30 to 60 grams per meal to help with blood sugar control.   The need to take medication as prescribed, test blood sugar as directed, and to call between visits if there is a concern that blood sugar is uncontrolled is also discussed.   [name] is reminded of the importance of daily foot exam, annual eye examination, and good blood sugar, blood pressure and cholesterol control.  Diabetic Labs Latest Ref Rng & Units 08/10/2016 04/02/2016 03/28/2016 01/19/2016 09/12/2015  HbA1c <5.7 % 5.5 - 5.8(H) 5.9 6.0(H)  Microalbumin Not estab mg/dL - - - - 0.4  Micro/Creat Ratio <30 mcg/mg creat - - - - 3  Chol 125 - 200 mg/dL - - - - 150  HDL >=46 mg/dL - - - - 64  Calc LDL mg/dL - - - 83 74  Triglycerides <150 mg/dL - - - - 58  Creatinine 0.50 - 1.10 mg/dL 0.71 0.74 0.70 - 0.75   BP/Weight 08/15/2016 08/01/2016 05/31/2016 05/15/2016  05/14/2016 04/17/2016 77/93/9030  Systolic BP 092 - - 330 076 - 226  Diastolic BP 78 - - 78 74 - 64  Wt. (Lbs) 280.12 284.6 286.6 300 295 298.2 -  BMI 51.23 52.05 52.42 58.59 53.1 54.54 -   Foot/eye exam completion dates Latest Ref Rng & Units 09/12/2015 07/05/2014  Eye Exam No Retinopathy - -  Foot Form Completion - Done Done   Improved, reduce to once daily medication, still continue once daily testing, currently in wellness program, f/u in  3 months    Morbid obesity ([place]) Improved, 20 pound weight loss set for next 4 months, doing extremely well Patient  re-educated about  the importance of commitment to a  minimum of 150 minutes of exercise per week.  The importance of healthy food choices with portion control discussed. Encouraged to start a food diary, count calories and to consider  joining a support group. Sample diet sheets offered. Goals set by the patient for the next several months.   Weight /BMI 08/15/2016 08/01/2016 05/31/2016  WEIGHT 280 lb 1.9 oz 284 lb 9.6 oz 286 lb 9.6 oz  HEIGHT 5\' 2"  5\' 2"  5\' 2"   BMI 51.23 kg/m2 52.05 kg/m2 52.42 kg/m2      Vitamin D deficiency Not at goal, needs to commit to supplement  rept testing in the future

## 2016-08-17 NOTE — Assessment & Plan Note (Signed)
Not at goal, needs to commit to supplement  rept testing in the future

## 2016-08-17 NOTE — Assessment & Plan Note (Signed)
Improved, 20 pound weight loss set for next 4 months, doing extremely well Patient re-educated about  the importance of commitment to a  minimum of 150 minutes of exercise per week.  The importance of healthy food choices with portion control discussed. Encouraged to start a food diary, count calories and to consider  joining a support group. Sample diet sheets offered. Goals set by the patient for the next several months.   Weight /BMI 08/15/2016 08/01/2016 05/31/2016  WEIGHT 280 lb 1.9 oz 284 lb 9.6 oz 286 lb 9.6 oz  HEIGHT 5\' 2"  5\' 2"  5\' 2"   BMI 51.23 kg/m2 52.05 kg/m2 52.42 kg/m2

## 2016-08-17 NOTE — Assessment & Plan Note (Signed)
[name] is reminded of the importance of commitment to daily physical activity for 30 minutes or more, as able and the need to limit carbohydrate intake to 30 to 60 grams per meal to help with blood sugar control.   The need to take medication as prescribed, test blood sugar as directed, and to call between visits if there is a concern that blood sugar is uncontrolled is also discussed.   [name] is reminded of the importance of daily foot exam, annual eye examination, and good blood sugar, blood pressure and cholesterol control.  Diabetic Labs Latest Ref Rng & Units 08/10/2016 04/02/2016 03/28/2016 01/19/2016 09/12/2015  HbA1c <5.7 % 5.5 - 5.8(H) 5.9 6.0(H)  Microalbumin Not estab mg/dL - - - - 0.4  Micro/Creat Ratio <30 mcg/mg creat - - - - 3  Chol 125 - 200 mg/dL - - - - 150  HDL >=46 mg/dL - - - - 64  Calc LDL mg/dL - - - 83 74  Triglycerides <150 mg/dL - - - - 58  Creatinine 0.50 - 1.10 mg/dL 0.71 0.74 0.70 - 0.75   BP/Weight 08/15/2016 08/01/2016 05/31/2016 05/15/2016 05/14/2016 04/17/2016 81/85/9093  Systolic BP 112 - - 162 446 - 950  Diastolic BP 78 - - 78 74 - 64  Wt. (Lbs) 280.12 284.6 286.6 300 295 298.2 -  BMI 51.23 52.05 52.42 58.59 53.1 54.54 -   Foot/eye exam completion dates Latest Ref Rng & Units 09/12/2015 07/05/2014  Eye Exam No Retinopathy - -  Foot Form Completion - Done Done   Improved, reduce to once daily medication, still continue once daily testing, currently in wellness program, f/u in  3 months

## 2016-08-21 ENCOUNTER — Telehealth: Payer: Self-pay

## 2016-08-21 ENCOUNTER — Ambulatory Visit (HOSPITAL_COMMUNITY): Payer: 59

## 2016-08-21 MED ORDER — BLOOD GLUCOSE MONITOR KIT: PACK | 0 refills | Status: DC

## 2016-08-21 MED FILL — ACCU-CHEK GUIDE TEST STRIP: 90 days supply | Qty: 100 | Fill #0

## 2016-08-21 MED FILL — ACCU-CHEK FASTCLIX LANCETS: 90 days supply | Qty: 102 | Fill #0

## 2016-08-21 NOTE — Telephone Encounter (Signed)
rx sent for glucometer and supplies to pharmacy

## 2016-08-25 ENCOUNTER — Ambulatory Visit (HOSPITAL_COMMUNITY)
Admission: RE | Admit: 2016-08-25 | Discharge: 2016-08-25 | Disposition: A | Discharge status: Home / Self Care | Payer: 59 | Source: Ambulatory Visit | Attending: Family Medicine | Admitting: Family Medicine

## 2016-08-25 DIAGNOSIS — M25511 Pain in right shoulder: Secondary | ICD-10-CM | POA: Insufficient documentation

## 2016-08-25 DIAGNOSIS — G8929 Other chronic pain: Secondary | ICD-10-CM | POA: Insufficient documentation

## 2016-08-26 ENCOUNTER — Encounter: Payer: Self-pay | Admitting: Family Medicine

## 2016-08-28 ENCOUNTER — Other Ambulatory Visit: Payer: Self-pay | Admitting: Family Medicine

## 2016-08-28 DIAGNOSIS — M25511 Pain in right shoulder: Principal | ICD-10-CM

## 2016-08-28 DIAGNOSIS — G8929 Other chronic pain: Secondary | ICD-10-CM

## 2016-09-18 ENCOUNTER — Ambulatory Visit: Payer: Self-pay | Admitting: Skilled Nursing Facility1

## 2016-10-08 ENCOUNTER — Ambulatory Visit (INDEPENDENT_AMBULATORY_CARE_PROVIDER_SITE_OTHER): Payer: 59 | Admitting: Orthopedic Surgery

## 2016-10-08 ENCOUNTER — Encounter: Payer: Self-pay | Admitting: Orthopedic Surgery

## 2016-10-08 DIAGNOSIS — M25511 Pain in right shoulder: Secondary | ICD-10-CM | POA: Diagnosis not present

## 2016-10-08 DIAGNOSIS — M7541 Impingement syndrome of right shoulder: Secondary | ICD-10-CM

## 2016-10-08 NOTE — Progress Notes (Signed)
Patient ID: [name], female   DOB: 19-Nov-1975, 41 y.o.   MRN: [id number]  Chief Complaint  Patient presents with  . Follow-up    MRI REVIEW RT SHOULDER    41 year old female previously treated for right shoulder rotator cuff syndrome had an MRI through her primary care doctor which showed tendinitis. She presents back for reevaluation.  The patient had an MRI due to persistent and consistent pain in the right shoulder with painful range of motion especially forward elevation with pain localizing to the anterolateral acromion and deltoid sometimes radiating to the elbow but not below. There was no trauma until after the MRI she fell onto the right shoulder after fainting episode and then she seemed to get a little bit stiffer after that.  She takes occasional ibuprofen. She did do some home exercises which didn't help    Review of Systems  Constitutional: Negative for fever.  Skin: Negative.   Neurological: Negative for tingling and focal weakness.   Past Medical History:  Diagnosis Date  . Allergy   . Anemia   . Arthritis   . Diabetes mellitus    type II   . GERD (gastroesophageal reflux disease)   . PONV (postoperative nausea and vomiting)     Gen. appearance is normal grooming and hygiene normal Orientation to person place and time normal Mood normal Walking was normal  Neck tenderness negative and normal neck range of motion  Right Shoulder Exam  Right shoulder exam is normal.  Tenderness  The patient is experiencing tenderness in the acromion (Deltoid and rotator interval anteriorly).  Range of Motion  The patient has normal right shoulder ROM. Active Abduction: 80  Passive Abduction: 80  Forward Flexion: 110  External Rotation: 30   Muscle Strength  The patient has normal right shoulder strength.  Tests  Apprehension: negative Cross Arm: positive Drop Arm: negative Impingement: positive  Other  Erythema: absent Sensation: normal Pulse:  present   Left Shoulder Exam  Left shoulder exam is normal.  Tenderness  The patient is experiencing no tenderness.     Range of Motion  The patient has normal left shoulder ROM.  Muscle Strength  The patient has normal left shoulder strength.  Tests  Apprehension: negative  Other  Erythema: absent Sensation: normal Pulse: present       A/P  Medical decision-making  IMPRESSION: 1. No acute findings demonstrated. The rotator cuff appears intact with mild infraspinatus tendinosis. 2. Mild acromioclavicular degenerative changes. 3. The labrum and biceps tendon appear intact. Labral assessment suboptimal.     Electronically Signed   By: Richardean Sale M.D.   On: 08/25/2016 14:32  Encounter Diagnoses  Name Primary?  . Pain in joint of right shoulder Yes  . Shoulder impingement syndrome, right    I looked at the MRI I agree there is no tear. The mild before meals joint arthritis is not of any consequence.  We would like her to go to occupational therapy and come back in 6 weeks she did not want injection she will take ibuprofen 800 mg twice a day for the next 10 days  Arther Abbott, MD 10/08/2016 3:28 PM

## 2016-10-26 ENCOUNTER — Encounter (HOSPITAL_COMMUNITY): Payer: Self-pay | Admitting: Occupational Therapy

## 2016-10-26 ENCOUNTER — Ambulatory Visit (HOSPITAL_COMMUNITY): Payer: 59 | Attending: Orthopedic Surgery | Admitting: Occupational Therapy

## 2016-10-26 DIAGNOSIS — M25611 Stiffness of right shoulder, not elsewhere classified: Secondary | ICD-10-CM | POA: Diagnosis not present

## 2016-10-26 DIAGNOSIS — R29898 Other symptoms and signs involving the musculoskeletal system: Secondary | ICD-10-CM | POA: Diagnosis not present

## 2016-10-26 DIAGNOSIS — M25511 Pain in right shoulder: Secondary | ICD-10-CM | POA: Insufficient documentation

## 2016-10-26 DIAGNOSIS — G8929 Other chronic pain: Secondary | ICD-10-CM | POA: Diagnosis not present

## 2016-10-26 NOTE — Patient Instructions (Signed)
  1) Flexion Wall Stretch    Face wall, place affected handon wall in front of you. Slide hand up the wall  and lean body in towards the wall. Hold for 10 seconds. Repeat 3-5 times. 1-2 times/day.     2) Towel Stretch with Internal Rotation   Gently pull up your affected arm  behind your back with the assist of a towel. Hold 10 seconds, complete 3-5X, 1-2 sets/day.            3) Corner Stretch    Stand at a corner of a wall, place your arms on the walls with elbows bent. Lean into the corner until a stretch is felt along the front of your chest and/or shoulders. Hold for 10 seconds. Repeat 3-5X, 1-2 times/day.    4) Posterior Capsule Stretch    Bring the involved arm across chest. Grasp elbow and pull toward chest until you feel a stretch in the back of the upper arm and shoulder. Hold 10 seconds. Repeat 3-5X. Complete 1-2 times/day.      5) External Rotation Stretch:     Place your affected hand on the wall with the elbow bent and gently turn your body the opposite direction until a stretch is felt. Hold for 10 seconds, repeat 3-5X. 1-2X/day    1) Seated Row   Sit up straight with elbows by your sides. Pull back with shoulders/elbows, keeping forearms straight, as if pulling back on the reins of a horse. Squeeze shoulder blades together. Repeat _10__times, _1-2___sets/day    2) Shoulder Elevation    Sit up straight with arms by your sides. Slowly bring your shoulders up towards your ears. Repeat_10__times, _1-2___ sets/day    3) Shoulder Extension    Sit up straight with both arms by your side, draw your arms back behind your waist. Keep your elbows straight. Repeat __10__times, __1-2__sets/day.

## 2016-10-26 NOTE — Therapy (Signed)
[place], [place], [address] Phone: [phone]   Fax:  [phone]  Occupational Therapy Evaluation  Patient Details  Name: [name] MRN: [id number] Date of Birth: [birth year] Referring Provider: Dr. Arther Abbott  Encounter Date: 10/26/2016      OT End of Session - 10/26/16 1612    Visit Number 1   Number of Visits 8   Date for OT Re-Evaluation 11/25/16   Authorization Type UMR   Authorization Time Period $20 copay   OT Start Time 1434   OT Stop Time 1504   OT Time Calculation (min) 30 min   Activity Tolerance Patient tolerated treatment well   Behavior During Therapy [facility] for tasks assessed/performed      Past Medical History:  Diagnosis Date  . Allergy   . Anemia   . Arthritis   . Diabetes mellitus    type II   . GERD (gastroesophageal reflux disease)   . PONV (postoperative nausea and vomiting)     Past Surgical History:  Procedure Laterality Date  . CARDIAC CATHETERIZATION    . CESAREAN SECTION     x 2  . LAPAROSCOPIC GASTRIC SLEEVE RESECTION N/A 04/02/2016   Procedure: LAPAROSCOPIC GASTRIC SLEEVE RESECTION, UPPER ENDO;  Surgeon: Johnathan Hausen, MD;  Location: [facility];  Service: General;  Laterality: N/A;    There were no vitals filed for this visit.      Subjective Assessment - 10/26/16 1507    Subjective  S: I passed out a few weeks ago and I think my shoulder has been a little worse since then.    Pertinent History Pt is a 41 y/o female presenting with right shoulder pain, MRI shows tendonitis in supraspinatus tendon. Pt has been experiencing pain since approximately September 2017. Dr. Arther Abbott referred pt to occupational therapy for evaluation and treatment.    Patient Stated Goals To have less pain in my arm.    Currently in Pain? Yes   Pain Score 4    Pain Location Shoulder   Pain Orientation Right   Pain Descriptors / Indicators Aching   Pain Type Chronic pain    Pain Radiating Towards n/a   Pain Onset More than a month ago   Pain Frequency Constant   Aggravating Factors  reaching overhead, behind back   Pain Relieving Factors heat   Effect of Pain on Daily Activities min effect   Multiple Pain Sites No           [facility] OT Assessment - 10/26/16 1432      Assessment   Diagnosis Right shoulder impingement   Referring Provider Dr. Arther Abbott   Onset Date 12/23/15   Prior Therapy None     Precautions   Precautions None     Restrictions   Weight Bearing Restrictions No     Balance Screen   Has the patient fallen in the past 6 months Yes   How many times? 1   Has the patient had a decrease in activity level because of a fear of falling?  No   Is the patient reluctant to leave their home because of a fear of falling?  No     Prior Function   Level of Independence Independent   Vocation Full time employment   Vocation Requirements RN-lifting, pulling, pushing     ADL   ADL comments Pt is having difficulty with dressing, donning bra, reaching overhead, crossing midline, lifting heavy items  Written Expression   Dominant Hand Right     Cognition   Overall Cognitive Status Within Functional Limits for tasks assessed     ROM / Strength   AROM / PROM / Strength AROM;PROM;Strength     Palpation   Palpation comment Mod fascial restrictions in right upper arm, trapezius, and scapularis regions     AROM   Overall AROM Comments Assessed seated, er/IR adducted   AROM Assessment Site Shoulder   Right/Left Shoulder Right   Right Shoulder Flexion 121 Degrees   Right Shoulder ABduction 96 Degrees   Right Shoulder Internal Rotation 90 Degrees   Right Shoulder External Rotation 50 Degrees     PROM   Overall PROM Comments Assessed supine, er/IR adducted   PROM Assessment Site Shoulder   Right/Left Shoulder Right   Right Shoulder Flexion 141 Degrees   Right Shoulder ABduction 97 Degrees   Right Shoulder Internal Rotation 90  Degrees   Right Shoulder External Rotation 65 Degrees     Strength   Overall Strength Comments Assessed seated, er/IR adducted   Strength Assessment Site Shoulder   Right/Left Shoulder Right   Right Shoulder Flexion 3+/5   Right Shoulder ABduction 3-/5   Right Shoulder Internal Rotation 3+/5   Right Shoulder External Rotation 3/5                         OT Education - 10/26/16 1611    Education provided Yes   Education Details shoulder stretches, scapular A/ROM   Person(s) Educated Patient   Methods Explanation;Demonstration;Handout   Comprehension Verbalized understanding;Returned demonstration          OT Short Term Goals - 10/26/16 1617      OT SHORT TERM GOAL #1   Title Pt will be educated on HEP to improve mobility required for RUE use as dominant during daily tasks.    Time 4   Period Weeks   Status New     OT SHORT TERM GOAL #2   Title Pt will decrease RUE fascial restrictions from moderate to minimal amounts to improve mobility required for overhead reaching tasks.    Time 4   Period Weeks   Status New     OT SHORT TERM GOAL #3   Title Pt will improve A/ROM in RUE to [facility] to improve ability to reach behind back and fasten/unfasten bra.    Time 4   Period Weeks   Status New     OT SHORT TERM GOAL #4   Title Pt will decrease pain in RUE to 3/10 or less to improve ability to use RUE as dominant during B/IADL task completion.    Time 4   Period Weeks   Status New     OT SHORT TERM GOAL #5   Title Pt will improve RUE strength to 4+/5 to increase ability to use RUE as dominant during heavy work tasks.    Time 4   Period Weeks   Status New                  Plan - 10/26/16 1612    Clinical Impression Statement A: Pt is a 41 y/o female presenting with right shoulder pain present since approximately 12/2015 limiting functional use of RUE as dominant during daily and work tasks. Pt reports pain is around shoulder blade and at posterior  deloid mostly; OT notes trigger points at upper trapezius and medial border of scapula. Pt was provided with  shoulder stretches and scapular A/ROM for HEP.    Occupational Profile and client history currently impacting functional performance Pt is motivated to return to full functioning using RUE as dominant   Occupational performance deficits (Please refer to evaluation for details): ADL's;IADL's;Rest and Sleep;Work;Leisure   Rehab Potential Good   OT Frequency 2x / week   OT Duration 4 weeks   OT Treatment/Interventions Self-care/ADL training;Therapeutic exercise;Patient/family education;Ultrasound;Manual Therapy;Cryotherapy;Therapeutic activities;Electrical Stimulation;Passive range of motion;Moist Heat   Plan P: Pt will benefit from skilled OT services to decrease pain and fascial restrictions, increase joint range of motion, strength, and functional use of RUE as dominant. Treatment plan: myofascial release, manual therapy, P/ROM, A/ROM, scapular mobility and strengthening, general RUE strengthening, modalities as needed.    Clinical Decision Making Limited treatment options, no task modification necessary   OT Home Exercise Plan 7/6: shoulder stretches and scapular A/ROM   Consulted and Agree with Plan of Care Patient      Patient will benefit from skilled therapeutic intervention in order to improve the following deficits and impairments:  Decreased strength, Impaired flexibility, Decreased activity tolerance, Decreased mobility, Pain, Decreased range of motion, Impaired UE functional use, Increased fascial restricitons  Visit Diagnosis: Chronic right shoulder pain  Other symptoms and signs involving the musculoskeletal system  Stiffness of right shoulder, not elsewhere classified    Problem List Patient Active Problem List   Diagnosis Date Noted  . Chronic right shoulder pain 08/15/2016  . 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  . Morbid obesity ([place]) 09/12/2015   . Vitamin B 12 deficiency 11/22/2011  . Vitamin D deficiency 11/22/2011  . FATIGUE 11/28/2009  . Diabetes mellitus type 2 in obese 09/03/2007  . Iron deficiency anemia 09/03/2007  . Allergic rhinitis 09/03/2007  . GERD 09/03/2007   Guadelupe Sabin, OTR/L  [phone] 10/26/2016, 4:19 PM  [place] [address] [place], [place], [address] Phone: [phone]   Fax:  [phone]  Name: [name] MRN: [id number] Date of Birth: [birth year]

## 2016-11-01 ENCOUNTER — Encounter (HOSPITAL_COMMUNITY): Payer: Self-pay | Admitting: Occupational Therapy

## 2016-11-01 ENCOUNTER — Ambulatory Visit (HOSPITAL_COMMUNITY): Payer: 59 | Admitting: Occupational Therapy

## 2016-11-01 DIAGNOSIS — R29898 Other symptoms and signs involving the musculoskeletal system: Secondary | ICD-10-CM | POA: Diagnosis not present

## 2016-11-01 DIAGNOSIS — G8929 Other chronic pain: Secondary | ICD-10-CM | POA: Diagnosis not present

## 2016-11-01 DIAGNOSIS — M25611 Stiffness of right shoulder, not elsewhere classified: Secondary | ICD-10-CM

## 2016-11-01 DIAGNOSIS — M25511 Pain in right shoulder: Principal | ICD-10-CM

## 2016-11-01 NOTE — Therapy (Signed)
[place], [place], [address] Phone: [phone]   Fax:  [phone]  Occupational Therapy Treatment  Patient Details  Name: [name] MRN: [id number] Date of Birth: [birth year] Referring Provider: Dr. Arther Abbott  Encounter Date: 11/01/2016      OT End of Session - 11/01/16 1029    Visit Number 2   Number of Visits 8   Date for OT Re-Evaluation 11/25/16   Authorization Type UMR   Authorization Time Period $20 copay   OT Start Time 1002  Pt arrived late to session   OT Stop Time 1030   OT Time Calculation (min) 28 min   Activity Tolerance Patient tolerated treatment well   Behavior During Therapy [facility] for tasks assessed/performed      Past Medical History:  Diagnosis Date  . Allergy   . Anemia   . Arthritis   . Diabetes mellitus    type II   . GERD (gastroesophageal reflux disease)   . PONV (postoperative nausea and vomiting)     Past Surgical History:  Procedure Laterality Date  . CARDIAC CATHETERIZATION    . CESAREAN SECTION     x 2  . LAPAROSCOPIC GASTRIC SLEEVE RESECTION N/A 04/02/2016   Procedure: LAPAROSCOPIC GASTRIC SLEEVE RESECTION, UPPER ENDO;  Surgeon: Johnathan Hausen, MD;  Location: [facility];  Service: General;  Laterality: N/A;    There were no vitals filed for this visit.      Subjective Assessment - 11/01/16 1015    Subjective  S: I've been stretching it out and it's getting a lot better.   Currently in Pain? No/denies            [facility] OT Assessment - 11/01/16 1016      Assessment   Diagnosis Right shoulder impingement   Prior Therapy None                  OT Treatments/Exercises (OP) - 11/01/16 1016      Exercises   Exercises Shoulder     Shoulder Exercises: Supine   Protraction PROM;AROM;10 reps   Horizontal ABduction PROM;AROM;10 reps   External Rotation PROM;AROM;10 reps   Internal Rotation PROM;AROM;10 reps   Flexion PROM;AROM;10 reps   ABduction PROM;AROM;10 reps     Shoulder Exercises: Seated   Extension AROM;10 reps   Retraction AROM;10 reps   Row AROM;10 reps   Protraction AROM;10 reps   Horizontal ABduction AROM;10 reps   External Rotation AROM;10 reps   Internal Rotation AROM;10 reps   Flexion AROM;10 reps   Abduction AROM;10 reps                OT Education - 11/01/16 1015    Education provided Yes   Education Details Provided pt with handout of HEP exercises for shoulder A/ROM   Person(s) Educated Patient   Methods Explanation;Demonstration;Verbal cues;Handout   Comprehension Verbalized understanding;Returned demonstration          OT Short Term Goals - 11/01/16 1030      OT SHORT TERM GOAL #1   Title Pt will be educated on HEP to improve mobility required for RUE use as dominant during daily tasks.    Time 4   Period Weeks   Status On-going     OT SHORT TERM GOAL #2   Title Pt will decrease RUE fascial restrictions from moderate to minimal amounts to improve mobility required for overhead reaching tasks.    Time 4  Period Weeks   Status On-going     OT SHORT TERM GOAL #3   Title Pt will improve A/ROM in RUE to [facility] to improve ability to reach behind back and fasten/unfasten bra.    Time 4   Period Weeks   Status On-going     OT SHORT TERM GOAL #4   Title Pt will decrease pain in RUE to 3/10 or less to improve ability to use RUE as dominant during B/IADL task completion.    Time 4   Period Weeks   Status On-going     OT SHORT TERM GOAL #5   Title Pt will improve RUE strength to 4+/5 to increase ability to use RUE as dominant during heavy work tasks.    Time 4   Period Weeks   Status On-going                  Plan - 11/01/16 1110    Clinical Impression Statement A: Pt arrived late to session therefore manual massage was not completed. Initiated P/ROM and A/ROM shoulder exercises. VC needed for form and technique. Pt reports her ROM has increased and she is able to  do more activities since beginning HEP.   Plan P: Initiate manual massage. Continue with A/ROM exercises. Add overhead lace and arm bike.   OT Home Exercise Plan 7/6: shoulder stretches and scapular A/ROM; 7/12: A/ROM shoulder exercises      Patient will benefit from skilled therapeutic intervention in order to improve the following deficits and impairments:  Decreased strength, Impaired flexibility, Decreased activity tolerance, Decreased mobility, Pain, Decreased range of motion, Impaired UE functional use, Increased fascial restricitons  Visit Diagnosis: Chronic right shoulder pain  Other symptoms and signs involving the musculoskeletal system  Stiffness of right shoulder, not elsewhere classified    Problem List Patient Active Problem List   Diagnosis Date Noted  . Chronic right shoulder pain 08/15/2016  . 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  . Morbid obesity ([place]) 09/12/2015  . Vitamin B 12 deficiency 11/22/2011  . Vitamin D deficiency 11/22/2011  . FATIGUE 11/28/2009  . Diabetes mellitus type 2 in obese 09/03/2007  . Iron deficiency anemia 09/03/2007  . Allergic rhinitis 09/03/2007  . GERD 09/03/2007    Luther Hearing, OT Student [phone] 11/01/2016, 12:14 PM  [place], [place], [address] Phone: [phone]   Fax:  [phone]  Name: [name] MRN: [id number] Date of Birth: [birth year]   Note reviewed by qualified practitioner and accurately reflects treatment session.  Guadelupe Sabin, OTR/L  [phone] 11/01/2016

## 2016-11-01 NOTE — Patient Instructions (Signed)
1) Seated Row   Sit up straight with elbows by your sides. Pull back with shoulders/elbows, keeping forearms straight, as if pulling back on the reins of a horse. Squeeze shoulder blades together. Repeat ___times, ____sets/day    2) Shoulder Elevation    Sit up straight with arms by your sides. Slowly bring your shoulders up towards your ears. Repeat___times, ____ sets/day    3) Shoulder Extension    Sit up straight with both arms by your side, draw your arms back behind your waist. Keep your elbows straight. Repeat ____times, ____sets/day.     1) Shoulder Protraction    Begin with elbows by your side, slowly "punch" straight out in front of you keeping arms/elbows straight.      2) Shoulder Flexion  Supine:     Standing:         Begin with arms at your side with thumbs pointed up, slowly raise both arms up and forward towards overhead.               3) Horizontal abduction/adduction  Supine:   Standing:           Begin with arms straight out in front of you, bring out to the side in at "T" shape. Keep arms straight entire time.                 4) Internal & External Rotation    *No band* -Stand with elbows at the side and elbows bent 90 degrees. Move your forearms away from your body, then bring back inward toward the body.     5) Shoulder Abduction  Supine:     Standing:       Lying on your back begin with your arms flat on the table next to your side. Slowly move your arms out to the side so that they go overhead, in a jumping jack or snow angel movement.

## 2016-11-02 ENCOUNTER — Ambulatory Visit (HOSPITAL_COMMUNITY): Payer: 59 | Admitting: Occupational Therapy

## 2016-11-02 ENCOUNTER — Encounter (HOSPITAL_COMMUNITY): Payer: Self-pay | Admitting: Occupational Therapy

## 2016-11-02 DIAGNOSIS — M25611 Stiffness of right shoulder, not elsewhere classified: Secondary | ICD-10-CM | POA: Diagnosis not present

## 2016-11-02 DIAGNOSIS — G8929 Other chronic pain: Secondary | ICD-10-CM

## 2016-11-02 DIAGNOSIS — R29898 Other symptoms and signs involving the musculoskeletal system: Secondary | ICD-10-CM | POA: Diagnosis not present

## 2016-11-02 DIAGNOSIS — M25511 Pain in right shoulder: Principal | ICD-10-CM

## 2016-11-02 NOTE — Therapy (Signed)
[place], [place], [address] Phone: [phone]   Fax:  [phone]  Occupational Therapy Treatment  Patient Details  Name: [name] MRN: [id number] Date of Birth: 1976-01-31 Referring Provider: Dr. Arther Abbott  Encounter Date: 11/02/2016      OT End of Session - 11/02/16 1527    Visit Number 3   Number of Visits 8   Date for OT Re-Evaluation 11/25/16   Authorization Type UMR   Authorization Time Period $20 copay   OT Start Time 1304   OT Stop Time 1345   OT Time Calculation (min) 41 min   Activity Tolerance Patient tolerated treatment well   Behavior During Therapy [facility] for tasks assessed/performed      Past Medical History:  Diagnosis Date  . Allergy   . Anemia   . Arthritis   . Diabetes mellitus    type II   . GERD (gastroesophageal reflux disease)   . PONV (postoperative nausea and vomiting)     Past Surgical History:  Procedure Laterality Date  . CARDIAC CATHETERIZATION    . CESAREAN SECTION     x 2  . LAPAROSCOPIC GASTRIC SLEEVE RESECTION N/A 04/02/2016   Procedure: LAPAROSCOPIC GASTRIC SLEEVE RESECTION, UPPER ENDO;  Surgeon: Johnathan Hausen, MD;  Location: [facility];  Service: General;  Laterality: N/A;    There were no vitals filed for this visit.      Subjective Assessment - 11/02/16 1304    Subjective  S:    Currently in Pain? No/denies            [facility] OT Assessment - 11/02/16 1303      Assessment   Diagnosis Right shoulder impingement     Precautions   Precautions None                  OT Treatments/Exercises (OP) - 11/02/16 1306      Exercises   Exercises Shoulder     Shoulder Exercises: Supine   Protraction PROM;AROM;10 reps   Horizontal ABduction PROM;AROM;10 reps   External Rotation PROM;AROM;10 reps   Internal Rotation PROM;AROM;10 reps   Flexion PROM;AROM;10 reps   ABduction PROM;AROM;10 reps     Shoulder Exercises: Seated   Extension AROM;10 reps   Retraction AROM;10 reps   Row AROM;10 reps   Protraction AROM;10 reps   Horizontal ABduction AROM;10 reps   External Rotation AROM;10 reps   Internal Rotation AROM;10 reps   Flexion AROM;10 reps   Abduction AROM;10 reps     Shoulder Exercises: Pulleys   Flexion 1 minute   ABduction 1 minute     Shoulder Exercises: Therapy Ball   Right/Left 5 reps  each direction     Shoulder Exercises: ROM/Strengthening   UBE (Upper Arm Bike) Level 1 2' forward 2' reverse   Over Head Lace 1'   Proximal Shoulder Strengthening, Supine 10X each no rest breaks     Shoulder Exercises: Stretch   Internal Rotation Stretch 2 reps  10 seconds     Manual Therapy   Manual Therapy Myofascial release   Manual therapy comments completed separately from therapeutic exercises   Myofascial Release Myofascial release to right upper arm, trapezius, and scapularis regions to decrease pain and fascial restrictions and increase joint range of motion                OT Education - 11/01/16 1015    Education provided Yes   Education Details Provided pt  with handout of HEP exercises for shoulder A/ROM   Person(s) Educated Patient   Methods Explanation;Demonstration;Verbal cues;Handout   Comprehension Verbalized understanding;Returned demonstration          OT Short Term Goals - 11/01/16 1030      OT SHORT TERM GOAL #1   Title Pt will be educated on HEP to improve mobility required for RUE use as dominant during daily tasks.    Time 4   Period Weeks   Status On-going     OT SHORT TERM GOAL #2   Title Pt will decrease RUE fascial restrictions from moderate to minimal amounts to improve mobility required for overhead reaching tasks.    Time 4   Period Weeks   Status On-going     OT SHORT TERM GOAL #3   Title Pt will improve A/ROM in RUE to [facility] to improve ability to reach behind back and fasten/unfasten bra.    Time 4   Period Weeks   Status On-going     OT SHORT  TERM GOAL #4   Title Pt will decrease pain in RUE to 3/10 or less to improve ability to use RUE as dominant during B/IADL task completion.    Time 4   Period Weeks   Status On-going     OT SHORT TERM GOAL #5   Title Pt will improve RUE strength to 4+/5 to increase ability to use RUE as dominant during heavy work tasks.    Time 4   Period Weeks   Status On-going                  Plan - 11/02/16 1527    Clinical Impression Statement A: Initiated myofascial release and manual therapy this session, pt reports exercises are going well and she is able to achieve more ROM with A/ROM. Added ball circles for IR/er, pulleys, UBE, and overhead lacing. Intermittent verbal cuing for fatigue.    Plan P: Continue with A/ROM increasing repetitions to 12. Add scapular theraband   OT Home Exercise Plan 7/6: shoulder stretches and scapular A/ROM; 7/12: A/ROM shoulder exercises   Consulted and Agree with Plan of Care Patient      Patient will benefit from skilled therapeutic intervention in order to improve the following deficits and impairments:  Decreased strength, Impaired flexibility, Decreased activity tolerance, Decreased mobility, Pain, Decreased range of motion, Impaired UE functional use, Increased fascial restricitons  Visit Diagnosis: Chronic right shoulder pain  Other symptoms and signs involving the musculoskeletal system  Stiffness of right shoulder, not elsewhere classified    Problem List Patient Active Problem List   Diagnosis Date Noted  . Chronic right shoulder pain 08/15/2016  . 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  . Morbid obesity ([place]) 09/12/2015  . Vitamin B 12 deficiency 11/22/2011  . Vitamin D deficiency 11/22/2011  . FATIGUE 11/28/2009  . Diabetes mellitus type 2 in obese 09/03/2007  . Iron deficiency anemia 09/03/2007  . Allergic rhinitis 09/03/2007  . GERD 09/03/2007   Guadelupe Sabin, OTR/L  [phone] 11/02/2016, 3:30 PM  [place] [address] [place], [place], [address] Phone: [phone]   Fax:  [phone]  Name: [name] MRN: [id number] Date of Birth: [birth year]

## 2016-11-06 ENCOUNTER — Ambulatory Visit (HOSPITAL_COMMUNITY): Payer: 59

## 2016-11-06 ENCOUNTER — Encounter (HOSPITAL_COMMUNITY): Payer: Self-pay

## 2016-11-06 DIAGNOSIS — M25611 Stiffness of right shoulder, not elsewhere classified: Secondary | ICD-10-CM

## 2016-11-06 DIAGNOSIS — M25511 Pain in right shoulder: Secondary | ICD-10-CM | POA: Diagnosis not present

## 2016-11-06 DIAGNOSIS — R29898 Other symptoms and signs involving the musculoskeletal system: Secondary | ICD-10-CM

## 2016-11-06 DIAGNOSIS — G8929 Other chronic pain: Secondary | ICD-10-CM

## 2016-11-06 NOTE — Patient Instructions (Signed)
(  Home) Extension: Isometric / Bilateral Arm Retraction - Sitting   Facing anchor, hold hands and elbow at shoulder height, with elbow bent.  Pull arms back to squeeze shoulder blades together. Repeat 10-15 times. 1-3 times/day.   Copyright  VHI. All rights reserved.   (Home) Retraction: Row - Bilateral (Anchor)   Facing anchor, arms reaching forward, pull hands toward stomach, keeping elbows bent and at your sides and pinching shoulder blades together. Repeat 10-15 times. 1-3 times/day.   Copyright  VHI. All rights reserved.   (Clinic) Extension / Flexion (Assist)   Face anchor, pull arms back, keeping elbow straight, and squeze shoulder blades together. Repeat 10-15 times. 1-3 times/day.   Copyright  VHI. All rights reserved.  

## 2016-11-06 NOTE — Therapy (Signed)
[place], [place], [address] Phone: [phone]   Fax:  [phone]  Occupational Therapy Treatment  Patient Details  Name: [name] MRN: [id number] Date of Birth: [birth year] Referring Provider: Dr. Arther Abbott  Encounter Date: 11/06/2016      OT End of Session - 11/06/16 1025    Visit Number 4   Number of Visits 8   Date for OT Re-Evaluation 11/25/16   Authorization Type UMR   Authorization Time Period $20 copay   OT Start Time [id number]  pt arrived late   OT Stop Time 1030   OT Time Calculation (min) 38 min   Activity Tolerance Patient tolerated treatment well   Behavior During Therapy [facility] for tasks assessed/performed      Past Medical History:  Diagnosis Date  . Allergy   . Anemia   . Arthritis   . Diabetes mellitus    type II   . GERD (gastroesophageal reflux disease)   . PONV (postoperative nausea and vomiting)     Past Surgical History:  Procedure Laterality Date  . CARDIAC CATHETERIZATION    . CESAREAN SECTION     x 2  . LAPAROSCOPIC GASTRIC SLEEVE RESECTION N/A 04/02/2016   Procedure: LAPAROSCOPIC GASTRIC SLEEVE RESECTION, UPPER ENDO;  Surgeon: Johnathan Hausen, MD;  Location: [facility];  Service: General;  Laterality: N/A;    There were no vitals filed for this visit.      Subjective Assessment - 11/06/16 1008    Subjective  S: My arm doesn't hurt today.   Currently in Pain? No/denies            [facility] OT Assessment - 11/06/16 1008      Assessment   Diagnosis Right shoulder impingement     Precautions   Precautions None                  OT Treatments/Exercises (OP) - 11/06/16 1009      Exercises   Exercises Shoulder     Shoulder Exercises: Supine   Protraction PROM;5 reps;AROM;12 reps   Horizontal ABduction PROM;5 reps;AROM;12 reps   External Rotation PROM;5 reps;AROM;12 reps   External Rotation Limitations shoulder abducted   Internal Rotation  PROM;5 reps;AROM;12 reps   Internal Rotation Limitations shoulder abducted   Flexion PROM;5 reps;AROM;12 reps   ABduction PROM;5 reps;AROM;12 reps     Shoulder Exercises: Seated   Protraction AROM;12 reps   Horizontal ABduction AROM;12 reps   External Rotation AROM;12 reps   Internal Rotation AROM;12 reps   Flexion AROM;12 reps   Abduction AROM;12 reps     Shoulder Exercises: Standing   Extension Theraband;12 reps   Theraband Level (Shoulder Extension) Level 2 (Red)   Row Theraband;12 reps   Theraband Level (Shoulder Row) Level 2 (Red)   Retraction Theraband;12 reps   Theraband Level (Shoulder Retraction) Level 2 (Red)     Shoulder Exercises: ROM/Strengthening   UBE (Upper Arm Bike) Level 1 3' forward 3' reverse   X to V Arms 10X   Proximal Shoulder Strengthening, Supine 12X each no rest breaks   Proximal Shoulder Strengthening, Seated 12X no rest breaks     Manual Therapy   Manual Therapy Myofascial release;Muscle Energy Technique   Manual therapy comments completed separately from therapeutic exercises   Myofascial Release Myofascial release to right upper arm, trapezius, and scapularis regions to decrease pain and fascial restrictions and increase joint range of motion   Muscle Energy Technique Muscle  energy technique completed to right anterior deltoid to relax tone and muscle spasm and improve range of motion.                  OT Education - 11/06/16 1025    Education provided Yes   Education Details red scapular theraband exercises    Person(s) Educated Patient   Methods Explanation;Demonstration;Verbal cues;Handout   Comprehension Verbalized understanding;Returned demonstration          OT Short Term Goals - 11/01/16 1030      OT SHORT TERM GOAL #1   Title Pt will be educated on HEP to improve mobility required for RUE use as dominant during daily tasks.    Time 4   Period Weeks   Status On-going     OT SHORT TERM GOAL #2   Title Pt will decrease  RUE fascial restrictions from moderate to minimal amounts to improve mobility required for overhead reaching tasks.    Time 4   Period Weeks   Status On-going     OT SHORT TERM GOAL #3   Title Pt will improve A/ROM in RUE to [facility] to improve ability to reach behind back and fasten/unfasten bra.    Time 4   Period Weeks   Status On-going     OT SHORT TERM GOAL #4   Title Pt will decrease pain in RUE to 3/10 or less to improve ability to use RUE as dominant during B/IADL task completion.    Time 4   Period Weeks   Status On-going     OT SHORT TERM GOAL #5   Title Pt will improve RUE strength to 4+/5 to increase ability to use RUE as dominant during heavy work tasks.    Time 4   Period Weeks   Status On-going                  Plan - 11/06/16 1025    Clinical Impression Statement A: Increase A/ROM to 12 repetitions, added X to V arms, increased time on UBE bike, and added scapular theraband exercises. Patient has great response to muscle energy technique while showing an increase in P/ROM, during shoulder flexion and IR/er.   Plan P: Continue to work on increasing ROM needed to complete functional tasks. Add ball on the wall.       Patient will benefit from skilled therapeutic intervention in order to improve the following deficits and impairments:  Decreased strength, Impaired flexibility, Decreased activity tolerance, Decreased mobility, Pain, Decreased range of motion, Impaired UE functional use, Increased fascial restricitons  Visit Diagnosis: Chronic right shoulder pain  Other symptoms and signs involving the musculoskeletal system  Stiffness of right shoulder, not elsewhere classified    Problem List Patient Active Problem List   Diagnosis Date Noted  . Chronic right shoulder pain 08/15/2016  . 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  . Morbid obesity ([place]) 09/12/2015  . Vitamin B 12 deficiency 11/22/2011  . Vitamin D deficiency 11/22/2011   . FATIGUE 11/28/2009  . Diabetes mellitus type 2 in obese 09/03/2007  . Iron deficiency anemia 09/03/2007  . Allergic rhinitis 09/03/2007  . GERD 09/03/2007   Ailene Ravel, OTR/L,CBIS  [phone]  11/06/2016, 10:28 AM  [place] [address] [place], [place], [address] Phone: [phone]   Fax:  [phone]  Name: CAMBRYN CHARTERS MRN: [id number] Date of Birth: 1976/03/27

## 2016-11-12 ENCOUNTER — Ambulatory Visit (HOSPITAL_COMMUNITY): Payer: 59

## 2016-11-12 ENCOUNTER — Telehealth (HOSPITAL_COMMUNITY): Payer: Self-pay | Admitting: Family Medicine

## 2016-11-12 NOTE — Telephone Encounter (Signed)
11/12/16  patient left a message that she would be out of town but does want to reschedule this appt.

## 2016-11-15 MED FILL — METFORMIN HCL ER 500 MG TAB: 500 | 90 days supply | Qty: 90 | Fill #1

## 2016-11-16 ENCOUNTER — Telehealth (HOSPITAL_COMMUNITY): Payer: Self-pay

## 2016-11-16 ENCOUNTER — Ambulatory Visit (HOSPITAL_COMMUNITY): Payer: 59

## 2016-11-16 ENCOUNTER — Ambulatory Visit: Payer: Self-pay | Admitting: Orthopedic Surgery

## 2016-11-16 NOTE — Telephone Encounter (Signed)
She can not leave work

## 2016-11-21 ENCOUNTER — Encounter (HOSPITAL_COMMUNITY): Payer: Self-pay

## 2016-11-21 ENCOUNTER — Encounter (HOSPITAL_COMMUNITY): Payer: Self-pay | Admitting: Specialist

## 2016-11-21 ENCOUNTER — Ambulatory Visit (HOSPITAL_COMMUNITY): Payer: 59 | Attending: Orthopedic Surgery

## 2016-11-21 DIAGNOSIS — G8929 Other chronic pain: Secondary | ICD-10-CM | POA: Diagnosis not present

## 2016-11-21 DIAGNOSIS — R29898 Other symptoms and signs involving the musculoskeletal system: Secondary | ICD-10-CM | POA: Diagnosis not present

## 2016-11-21 DIAGNOSIS — M25611 Stiffness of right shoulder, not elsewhere classified: Secondary | ICD-10-CM | POA: Insufficient documentation

## 2016-11-21 DIAGNOSIS — M25511 Pain in right shoulder: Secondary | ICD-10-CM | POA: Diagnosis not present

## 2016-11-21 NOTE — Therapy (Signed)
[facility] [address] [place], [place], [address] Phone: [phone]   Fax:  [phone]  Occupational Therapy Treatment  Patient Details  Name: [name] MRN: [id number] Date of Birth: 05/01/1975 Referring Provider: Dr. Arther Abbott  Encounter Date: 11/21/2016      OT End of Session - 11/21/16 1110    Visit Number 5   Number of Visits 8   Date for OT Re-Evaluation 11/25/16   Authorization Type UMR   Authorization Time Period $20 copay   OT Start Time 1046  pt arrived to session on wrong day, therapist was able to see her anyways   OT Stop Time 1115   OT Time Calculation (min) 29 min   Activity Tolerance Patient tolerated treatment well   Behavior During Therapy [facility] for tasks assessed/performed      Past Medical History:  Diagnosis Date  . Allergy   . Anemia   . Arthritis   . Diabetes mellitus    type II   . GERD (gastroesophageal reflux disease)   . PONV (postoperative nausea and vomiting)     Past Surgical History:  Procedure Laterality Date  . CARDIAC CATHETERIZATION    . CESAREAN SECTION     x 2  . LAPAROSCOPIC GASTRIC SLEEVE RESECTION N/A 04/02/2016   Procedure: LAPAROSCOPIC GASTRIC SLEEVE RESECTION, UPPER ENDO;  Surgeon: Johnathan Hausen, MD;  Location: [facility];  Service: General;  Laterality: N/A;    There were no vitals filed for this visit.      Subjective Assessment - 11/21/16 1056    Subjective  S: I showed up on the wrong day and didn't mean to.   Currently in Pain? No/denies            [facility] OT Assessment - 11/21/16 1057      Assessment   Diagnosis Right shoulder impingement     Precautions   Precautions None                  OT Treatments/Exercises (OP) - 11/21/16 1057      Exercises   Exercises Shoulder     Shoulder Exercises: Supine   Protraction PROM;5 reps;AROM;15 reps   Horizontal ABduction PROM;5 reps;AROM;15 reps   External Rotation PROM;5 reps;AROM;15  reps   External Rotation Limitations shoulder abducted   Internal Rotation PROM;5 reps;AROM;15 reps   Internal Rotation Limitations shoulder abducted   Flexion PROM;5 reps;AROM;15 reps   ABduction PROM;5 reps;AROM;15 reps     Shoulder Exercises: Seated   Protraction AROM;15 reps   Horizontal ABduction AROM;15 reps   External Rotation AROM;15 reps   Internal Rotation AROM;15 reps   Flexion AROM;15 reps   Abduction AROM;15 reps     Shoulder Exercises: Standing   Extension Theraband;12 reps   Theraband Level (Shoulder Extension) Level 2 (Red)   Row Theraband;12 reps   Theraband Level (Shoulder Row) Level 2 (Red)   Retraction Theraband;12 reps   Theraband Level (Shoulder Retraction) Level 2 (Red)     Shoulder Exercises: ROM/Strengthening   X to V Arms 10X   Proximal Shoulder Strengthening, Seated 12X no rest breaks   Ball on Wall 1'                OT Education - 11/21/16 1110    Education provided No          OT Short Term Goals - 11/01/16 1030      OT SHORT TERM GOAL #1   Title Pt  will be educated on HEP to improve mobility required for RUE use as dominant during daily tasks.    Time 4   Period Weeks   Status On-going     OT SHORT TERM GOAL #2   Title Pt will decrease RUE fascial restrictions from moderate to minimal amounts to improve mobility required for overhead reaching tasks.    Time 4   Period Weeks   Status On-going     OT SHORT TERM GOAL #3   Title Pt will improve A/ROM in RUE to [facility] to improve ability to reach behind back and fasten/unfasten bra.    Time 4   Period Weeks   Status On-going     OT SHORT TERM GOAL #4   Title Pt will decrease pain in RUE to 3/10 or less to improve ability to use RUE as dominant during B/IADL task completion.    Time 4   Period Weeks   Status On-going     OT SHORT TERM GOAL #5   Title Pt will improve RUE strength to 4+/5 to increase ability to use RUE as dominant during heavy work tasks.    Time 4   Period  Weeks   Status On-going                  Plan - 11/21/16 1111    Clinical Impression Statement A: Pt arrived for session today and did not have one scheduled, howeer therapist was able to treat her anyways. No manual therapy completed due to time constraint. Pt has shown significant improvement in shoulder A/ROM. Completed X to V arms and proximal shoulder strengthening with VC needed for form and technique. Added ball on the wall.   Plan P: Complete reassessment determine if additional therapy isneeded. . Add shoulder strengthening.      Patient will benefit from skilled therapeutic intervention in order to improve the following deficits and impairments:  Decreased strength, Impaired flexibility, Decreased activity tolerance, Decreased mobility, Pain, Decreased range of motion, Impaired UE functional use, Increased fascial restricitons  Visit Diagnosis: Chronic right shoulder pain  Other symptoms and signs involving the musculoskeletal system  Stiffness of right shoulder, not elsewhere classified    Problem List Patient Active Problem List   Diagnosis Date Noted  . Chronic right shoulder pain 08/15/2016  . 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  . Morbid obesity ([place]) 09/12/2015  . Vitamin B 12 deficiency 11/22/2011  . Vitamin D deficiency 11/22/2011  . FATIGUE 11/28/2009  . Diabetes mellitus type 2 in obese 09/03/2007  . Iron deficiency anemia 09/03/2007  . Allergic rhinitis 09/03/2007  . GERD 09/03/2007    Luther Hearing, OT Student [phone] 11/21/2016, 12:10 PM  [facility] [address] [place], [place], [address] Phone: [phone]   Fax:  [phone]  Name: [name] MRN: [id number] Date of Birth: [birth year]   Note reviewed by clinical instructor and accurately reflects treatment session.    Ailene Ravel, OTR/L,CBIS  [phone]

## 2016-11-22 ENCOUNTER — Encounter (HOSPITAL_COMMUNITY): Payer: Self-pay | Admitting: Specialist

## 2016-11-22 ENCOUNTER — Ambulatory Visit (HOSPITAL_COMMUNITY): Payer: 59 | Admitting: Specialist

## 2016-11-22 DIAGNOSIS — M25611 Stiffness of right shoulder, not elsewhere classified: Secondary | ICD-10-CM | POA: Diagnosis not present

## 2016-11-22 DIAGNOSIS — R29898 Other symptoms and signs involving the musculoskeletal system: Secondary | ICD-10-CM | POA: Diagnosis not present

## 2016-11-22 DIAGNOSIS — M25511 Pain in right shoulder: Secondary | ICD-10-CM | POA: Diagnosis not present

## 2016-11-22 DIAGNOSIS — G8929 Other chronic pain: Secondary | ICD-10-CM

## 2016-11-22 NOTE — Therapy (Signed)
[place], [place], [address] Phone: [phone]   Fax:  [phone]  Occupational Therapy Treatment  Patient Details  Name: [name] MRN: [id number] Date of Birth: 10-Sep-1975 Referring Provider: Dr. Arther Abbott  Encounter Date: 11/22/2016      OT End of Session - 11/22/16 1639    Visit Number 6   Number of Visits 10   Date for OT Re-Evaluation 12/06/16   Authorization Type UMR   Authorization Time Period $20 copay   OT Start Time 1350   OT Stop Time 1430   OT Time Calculation (min) 40 min   Activity Tolerance Patient tolerated treatment well   Behavior During Therapy [facility] for tasks assessed/performed      Past Medical History:  Diagnosis Date  . Allergy   . Anemia   . Arthritis   . Diabetes mellitus    type II   . GERD (gastroesophageal reflux disease)   . PONV (postoperative nausea and vomiting)     Past Surgical History:  Procedure Laterality Date  . CARDIAC CATHETERIZATION    . CESAREAN SECTION     x 2  . LAPAROSCOPIC GASTRIC SLEEVE RESECTION N/A 04/02/2016   Procedure: LAPAROSCOPIC GASTRIC SLEEVE RESECTION, UPPER ENDO;  Surgeon: Johnathan Hausen, MD;  Location: [facility];  Service: General;  Laterality: N/A;    There were no vitals filed for this visit.      Subjective Assessment - 11/22/16 1638    Subjective  S:  I feel like the pain is alot better, but its still there.    Currently in Pain? Yes   Pain Score 2    Pain Location Shoulder   Pain Orientation Right   Pain Descriptors / Indicators Aching   Pain Type Acute pain   Pain Onset Today   Pain Frequency Intermittent   Aggravating Factors  movement   Pain Relieving Factors rest   Effect of Pain on Daily Activities minimal            [facility] OT Assessment - 11/22/16 0001      Assessment   Diagnosis Right shoulder impingement   Referring Provider Dr. Arther Abbott   Onset Date 12/23/15     Precautions   Precautions None     AROM   Overall AROM Comments Assessed seated, er/IR adducted   Right Shoulder Flexion 140 Degrees  121   Right Shoulder ABduction 105 Degrees  96   Right Shoulder Internal Rotation 90 Degrees  90   Right Shoulder External Rotation 50 Degrees  50     Strength   Overall Strength Comments Assessed seated, er/IR adducted   Right Shoulder Flexion 5/5  3+/5   Right Shoulder ABduction 5/5  3+/5   Right Shoulder Internal Rotation 5/5  3+/5   Right Shoulder External Rotation 5/5  3/5                  OT Treatments/Exercises (OP) - 11/22/16 0001      Exercises   Exercises Shoulder     Shoulder Exercises: Supine   Protraction PROM;5 reps;AROM;15 reps   Horizontal ABduction PROM;5 reps;AROM;15 reps   External Rotation PROM;5 reps;AROM;15 reps   Internal Rotation PROM;5 reps;AROM;15 reps   Flexion PROM;5 reps;AROM;15 reps   ABduction PROM;5 reps;AROM;15 reps     Shoulder Exercises: Seated   Protraction AROM;15 reps   Horizontal ABduction AROM;15 reps   External Rotation AROM;15 reps   Internal Rotation AROM;15 reps  Flexion AROM;15 reps   Abduction AROM;15 reps     Shoulder Exercises: Standing   External Rotation Theraband;12 reps   Theraband Level (Shoulder External Rotation) Level 2 (Red)   Internal Rotation Theraband;12 reps   Theraband Level (Shoulder Internal Rotation) Level 2 (Red)   Extension Theraband;12 reps   Theraband Level (Shoulder Extension) Level 2 (Red)   Row Theraband;12 reps   Theraband Level (Shoulder Row) Level 2 (Red)   Retraction Theraband;12 reps   Theraband Level (Shoulder Retraction) Level 2 (Red)     Shoulder Exercises: ROM/Strengthening   UBE (Upper Arm Bike) leve 2 3' forward and 3' reverse   "W" Arms 10X   X to V Arms 10X   Proximal Shoulder Strengthening, Supine 12X each no rest breaks   Proximal Shoulder Strengthening, Seated 12X no rest breaks   Ball on Wall 1'     Manual Therapy   Manual Therapy  Myofascial release;Muscle Energy Technique   Manual therapy comments completed separately from therapeutic exercises   Myofascial Release Myofascial release to right upper arm, trapezius, and scapularis regions to decrease pain and fascial restrictions and increase joint range of motion                OT Education - 11/21/16 1110    Education provided No          OT Short Term Goals - 11/22/16 1644      OT SHORT TERM GOAL #1   Title Pt will be educated on HEP to improve mobility required for RUE use as dominant during daily tasks.    Time 4   Period Weeks   Status On-going     OT SHORT TERM GOAL #2   Title Pt will decrease RUE fascial restrictions from moderate to minimal amounts to improve mobility required for overhead reaching tasks.    Time 4   Period Weeks   Status Achieved     OT SHORT TERM GOAL #3   Title Pt will improve A/ROM in RUE to [facility] to improve ability to reach behind back and fasten/unfasten bra.    Time 4   Period Weeks   Status On-going     OT SHORT TERM GOAL #4   Title Pt will decrease pain in RUE to 3/10 or less to improve ability to use RUE as dominant during B/IADL task completion.    Time 4   Period Weeks   Status On-going     OT SHORT TERM GOAL #5   Title Pt will improve RUE strength to 4+/5 to increase ability to use RUE as dominant during heavy work tasks.    Time 4   Period Weeks   Status Achieved                  Plan - 11/22/16 1641    Clinical Impression Statement A:  Patient has made significant improvements in A/ROm and strength, however range of motion is limited at end range.  Pain persists with overhead reaching and out to the side, causing difficulty to compelte work duties.  Patient will benefit from continued skilled OT for 2 additional weeks to imrpove upon deficits.    OT Frequency 2x / week   OT Duration 2 weeks   OT Treatment/Interventions Self-care/ADL training;Therapeutic exercise;Patient/family  education;Ultrasound;Manual Therapy;Cryotherapy;Therapeutic activities;Electrical Stimulation;Passive range of motion;Moist Heat   Plan P;  Continue skilled OT intervention to decrease pain to minimal and improve A/ROM to WNL.     Consulted and Agree with  Plan of Care Patient      Patient will benefit from skilled therapeutic intervention in order to improve the following deficits and impairments:  Decreased strength, Impaired flexibility, Decreased activity tolerance, Decreased mobility, Pain, Decreased range of motion, Impaired UE functional use, Increased fascial restricitons  Visit Diagnosis: Chronic right shoulder pain - Plan: Ot plan of care cert/re-cert  Other symptoms and signs involving the musculoskeletal system - Plan: Ot plan of care cert/re-cert  Stiffness of right shoulder, not elsewhere classified - Plan: Ot plan of care cert/re-cert    Problem List Patient Active Problem List   Diagnosis Date Noted  . Chronic right shoulder pain 08/15/2016  . 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  . Morbid obesity ([place]) 09/12/2015  . Vitamin B 12 deficiency 11/22/2011  . Vitamin D deficiency 11/22/2011  . FATIGUE 11/28/2009  . Diabetes mellitus type 2 in obese 09/03/2007  . Iron deficiency anemia 09/03/2007  . Allergic rhinitis 09/03/2007  . GERD 09/03/2007    Vangie Bicker, [place], OTR/L [phone]  11/22/2016, 4:57 PM  [place] [address] [place], [place], [address] Phone: [phone]   Fax:  [phone]  Name: JOSELYNE SPAKE MRN: [id number] Date of Birth: 30-Aug-1975

## 2016-11-23 ENCOUNTER — Ambulatory Visit (HOSPITAL_COMMUNITY): Payer: 59

## 2016-11-27 ENCOUNTER — Ambulatory Visit (HOSPITAL_COMMUNITY): Payer: 59

## 2016-11-27 ENCOUNTER — Encounter (HOSPITAL_COMMUNITY): Payer: Self-pay

## 2016-11-27 DIAGNOSIS — R29898 Other symptoms and signs involving the musculoskeletal system: Secondary | ICD-10-CM

## 2016-11-27 DIAGNOSIS — M25611 Stiffness of right shoulder, not elsewhere classified: Secondary | ICD-10-CM | POA: Diagnosis not present

## 2016-11-27 DIAGNOSIS — M25511 Pain in right shoulder: Principal | ICD-10-CM

## 2016-11-27 DIAGNOSIS — G8929 Other chronic pain: Secondary | ICD-10-CM | POA: Diagnosis not present

## 2016-11-27 NOTE — Therapy (Signed)
[place], [place], [address] Phone: [phone]   Fax:  [phone]  Occupational Therapy Treatment  Patient Details  Name: [name] MRN: [id number] Date of Birth: 05/01/1975 Referring Provider: Dr. Arther Abbott  Encounter Date: 11/27/2016      OT End of Session - 11/27/16 1344    Visit Number 7   Number of Visits 10   Date for OT Re-Evaluation 12/06/16   Authorization Type UMR   Authorization Time Period $20 copay   OT Start Time 1307   OT Stop Time 1346   OT Time Calculation (min) 39 min   Activity Tolerance Patient tolerated treatment well   Behavior During Therapy [facility] for tasks assessed/performed      Past Medical History:  Diagnosis Date  . Allergy   . Anemia   . Arthritis   . Diabetes mellitus    type II   . GERD (gastroesophageal reflux disease)   . PONV (postoperative nausea and vomiting)     Past Surgical History:  Procedure Laterality Date  . CARDIAC CATHETERIZATION    . CESAREAN SECTION     x 2  . LAPAROSCOPIC GASTRIC SLEEVE RESECTION N/A 04/02/2016   Procedure: LAPAROSCOPIC GASTRIC SLEEVE RESECTION, UPPER ENDO;  Surgeon: Johnathan Hausen, MD;  Location: [facility];  Service: General;  Laterality: N/A;    There were no vitals filed for this visit.      Subjective Assessment - 11/27/16 1325    Subjective  S: The soreness isn't too bad.   Currently in Pain? Yes   Pain Score 4    Pain Location Shoulder   Pain Orientation Right   Pain Descriptors / Indicators Sore   Pain Type Acute pain   Pain Radiating Towards n/a   Pain Onset 1 to 4 weeks ago   Pain Frequency Intermittent   Aggravating Factors  movement   Pain Relieving Factors rest   Effect of Pain on Daily Activities minimal   Multiple Pain Sites No            [facility] OT Assessment - 11/27/16 1321      Assessment   Diagnosis Right shoulder impingement     Precautions   Precautions None                   OT Treatments/Exercises (OP) - 11/27/16 1322      Exercises   Exercises Shoulder     Shoulder Exercises: Supine   Protraction PROM;5 reps;Strengthening;10 reps   Protraction Weight (lbs) 1   Horizontal ABduction PROM;5 reps;Strengthening;10 reps   Horizontal ABduction Weight (lbs) 1   External Rotation PROM;5 reps;Strengthening;10 reps   External Rotation Weight (lbs) 1   Internal Rotation PROM;5 reps;Strengthening;10 reps   Internal Rotation Weight (lbs) 1   Flexion PROM;5 reps;Strengthening;10 reps   Shoulder Flexion Weight (lbs) 1   ABduction PROM;5 reps;Strengthening;10 reps     Shoulder Exercises: Seated   Extension Theraband;12 reps   Theraband Level (Shoulder Extension) Level 3 (Green)   Retraction Theraband;12 reps   Theraband Level (Shoulder Retraction) Level 3 (Green)   Row [employer] reps   Theraband Level (Shoulder Row) Level 3 (Green)     Shoulder Exercises: Standing   Protraction Strengthening;10 reps   Protraction Weight (lbs) 1   Horizontal ABduction Strengthening;10 reps   Horizontal ABduction Weight (lbs) 1   External Rotation Strengthening;10 reps   External Rotation Weight (lbs) 1   Internal Rotation Strengthening;10 reps  Internal Rotation Weight (lbs) 1   Flexion Strengthening;10 reps   Shoulder Flexion Weight (lbs) 1   ABduction Strengthening;10 reps   Shoulder ABduction Weight (lbs) 1     Shoulder Exercises: ROM/Strengthening   UBE (Upper Arm Bike) level 2 3' forward and 3' reverse   "W" Arms 12X with 1#   X to V Arms 12X with 1#   Proximal Shoulder Strengthening, Supine 12X each no rest breaks, 1#   Proximal Shoulder Strengthening, Seated 12X no rest breaks, 1#   Ball on Wall 1' flexion, 1' abduction                OT Education - 11/27/16 1326    Education provided No          OT Short Term Goals - 11/27/16 1342      OT SHORT TERM GOAL #1   Title Pt will be educated on HEP to improve mobility  required for RUE use as dominant during daily tasks.    Time 4   Period Weeks   Status On-going     OT SHORT TERM GOAL #2   Title Pt will decrease RUE fascial restrictions from moderate to minimal amounts to improve mobility required for overhead reaching tasks.    Time 4   Period Weeks     OT SHORT TERM GOAL #3   Title Pt will improve A/ROM in RUE to [facility] to improve ability to reach behind back and fasten/unfasten bra.    Time 4   Period Weeks   Status On-going     OT SHORT TERM GOAL #4   Title Pt will decrease pain in RUE to 3/10 or less to improve ability to use RUE as dominant during B/IADL task completion.    Time 4   Period Weeks   Status On-going     OT SHORT TERM GOAL #5   Title Pt will improve RUE strength to 4+/5 to increase ability to use RUE as dominant during heavy work tasks.    Time 4   Period Weeks                  Plan - 11/27/16 1345    Clinical Impression Statement A: Pt did very well this session. Pt completed shoulder strengthening with good form and zero pain increase. Completed scapular theraband exercises with VC needed for form and technique. Added ball on the wall in abduction.    Plan P: Continue with shoulder strengthening. Add to HEP.      Patient will benefit from skilled therapeutic intervention in order to improve the following deficits and impairments:  Decreased strength, Impaired flexibility, Decreased activity tolerance, Decreased mobility, Pain, Decreased range of motion, Impaired UE functional use, Increased fascial restricitons  Visit Diagnosis: Chronic right shoulder pain  Other symptoms and signs involving the musculoskeletal system  Stiffness of right shoulder, not elsewhere classified    Problem List Patient Active Problem List   Diagnosis Date Noted  . Chronic right shoulder pain 08/15/2016  . 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  . Morbid obesity ([place]) 09/12/2015  . Vitamin B 12 deficiency  11/22/2011  . Vitamin D deficiency 11/22/2011  . FATIGUE 11/28/2009  . Diabetes mellitus type 2 in obese 09/03/2007  . Iron deficiency anemia 09/03/2007  . Allergic rhinitis 09/03/2007  . GERD 09/03/2007    Luther Hearing, OT Student [phone] 11/27/2016, 2:11 PM  [place], [place], [address]  Phone: [phone]   Fax:  [phone]  Name: TAEJA DEBELLIS MRN: [id number] Date of Birth: 07/19/1975   Note reviewed by clinical instructor and accurately reflects treatment session.   Ailene Ravel, OTR/L,CBIS  [phone]

## 2016-11-29 ENCOUNTER — Other Ambulatory Visit: Payer: Self-pay | Admitting: Family Medicine

## 2016-11-29 MED FILL — VIT D3-50 50,000 UNITS CAPS: 1.25 MG | 84 days supply | Qty: 12 | Fill #1

## 2016-11-29 MED FILL — PANTOPRAZOLE SOD DR 40 MG T: 40 | 90 days supply | Qty: 90 | Fill #1

## 2016-11-30 MED FILL — FLUTICASONE PROP 50 MCG SPR: 50 | 90 days supply | Qty: 48 | Fill #0

## 2016-12-04 ENCOUNTER — Ambulatory Visit (INDEPENDENT_AMBULATORY_CARE_PROVIDER_SITE_OTHER): Payer: 59 | Admitting: Family Medicine

## 2016-12-04 ENCOUNTER — Encounter: Payer: Self-pay | Admitting: Family Medicine

## 2016-12-04 ENCOUNTER — Other Ambulatory Visit (HOSPITAL_COMMUNITY)
Admission: RE | Admit: 2016-12-04 | Discharge: 2016-12-04 | Disposition: A | Discharge status: Home / Self Care | Payer: 59 | Source: Other Acute Inpatient Hospital | Attending: Family Medicine | Admitting: Family Medicine

## 2016-12-04 ENCOUNTER — Ambulatory Visit (HOSPITAL_COMMUNITY): Payer: 59

## 2016-12-04 VITALS — BP 110/58 | HR 63 | Temp 97.4°F | Resp 16 | Ht 62.0 in | Wt 286.8 lb

## 2016-12-04 DIAGNOSIS — M25511 Pain in right shoulder: Secondary | ICD-10-CM | POA: Diagnosis not present

## 2016-12-04 DIAGNOSIS — E669 Obesity, unspecified: Secondary | ICD-10-CM

## 2016-12-04 DIAGNOSIS — K219 Gastro-esophageal reflux disease without esophagitis: Secondary | ICD-10-CM

## 2016-12-04 DIAGNOSIS — M25611 Stiffness of right shoulder, not elsewhere classified: Secondary | ICD-10-CM | POA: Diagnosis not present

## 2016-12-04 DIAGNOSIS — G8929 Other chronic pain: Secondary | ICD-10-CM

## 2016-12-04 DIAGNOSIS — R29898 Other symptoms and signs involving the musculoskeletal system: Secondary | ICD-10-CM | POA: Diagnosis not present

## 2016-12-04 DIAGNOSIS — E1169 Type 2 diabetes mellitus with other specified complication: Secondary | ICD-10-CM | POA: Insufficient documentation

## 2016-12-04 DIAGNOSIS — E559 Vitamin D deficiency, unspecified: Secondary | ICD-10-CM

## 2016-12-04 LAB — TSH: TSH: 2.26 m[IU]/L

## 2016-12-04 NOTE — Therapy (Signed)
[place], [place], [address] Phone: [phone]   Fax:  [phone]  Occupational Therapy Treatment And reassessment Patient Details  Name: [name] MRN: [id number] Date of Birth: 1975/07/01 Referring Provider: Dr. Arther Abbott  Encounter Date: 12/04/2016      OT End of Session - 12/04/16 1548    Visit Number 8   Number of Visits 10   Date for OT Re-Evaluation 12/06/16   Authorization Type UMR   Authorization Time Period $20 copay   OT Start Time 1435  reassessment and discharge   OT Stop Time 1515   OT Time Calculation (min) 40 min   Activity Tolerance Patient tolerated treatment well   Behavior During Therapy [facility] for tasks assessed/performed      Past Medical History:  Diagnosis Date  . Allergy   . Anemia   . Arthritis   . Diabetes mellitus    type II   . GERD (gastroesophageal reflux disease)   . PONV (postoperative nausea and vomiting)     Past Surgical History:  Procedure Laterality Date  . CARDIAC CATHETERIZATION    . CESAREAN SECTION     x 2  . LAPAROSCOPIC GASTRIC SLEEVE RESECTION N/A 04/02/2016   Procedure: LAPAROSCOPIC GASTRIC SLEEVE RESECTION, UPPER ENDO;  Surgeon: Johnathan Hausen, MD;  Location: [facility];  Service: General;  Laterality: N/A;    There were no vitals filed for this visit.          [facility] OT Assessment - 12/04/16 1439      Assessment   Diagnosis Right shoulder impingement   Onset Date 12/23/15     Precautions   Precautions None     ROM / Strength   AROM / PROM / Strength AROM;PROM;Strength     Palpation   Palpation comment Min fascial restrictions in right anterior deltoid and right lateral region of neck.     AROM   Overall AROM Comments Assessed seated, er/IR adducted   AROM Assessment Site Shoulder   Right/Left Shoulder Right   Right Shoulder Flexion 160 Degrees  previoius: 140   Right Shoulder ABduction 155 Degrees  previous: 105   Right Shoulder Internal Rotation 90 Degrees  previous: same   Right Shoulder External Rotation 70 Degrees  previous: 50     PROM   Overall PROM Comments Assessed supine, er/IR adducted   PROM Assessment Site Shoulder   Right/Left Shoulder Right   Right Shoulder Flexion 160 Degrees  previous: 141   Right Shoulder ABduction 180 Degrees  previous: 97   Right Shoulder Internal Rotation 90 Degrees  previous: same   Right Shoulder External Rotation 70 Degrees  previous: 65     Strength   Overall Strength Within functional limits for tasks performed   Overall Strength Comments Assessed seated, er/IR adducted   Strength Assessment Site Shoulder   Right/Left Shoulder Right                  OT Treatments/Exercises (OP) - 12/04/16 1508      Exercises   Exercises Shoulder     Shoulder Exercises: Supine   Protraction PROM;5 reps   Horizontal ABduction PROM;5 reps   External Rotation PROM;5 reps   Internal Rotation PROM;5 reps   Flexion PROM;5 reps   ABduction PROM;5 reps     Shoulder Exercises: Standing   Extension Theraband;10 reps   Theraband Level (Shoulder Extension) Level 2 (Red)   Row Theraband;10 reps   Theraband Level (  Shoulder Row) Level 2 (Red)   Retraction Theraband;10 reps   Theraband Level (Shoulder Retraction) Level 2 (Red)                OT Education - 12/04/16 1456    Education provided Yes   Education Details Reviewed HEP and recommended which shoulder stretches and strengthening exercises to continue to complete.   Person(s) Educated Patient   Methods Explanation;Demonstration;Verbal cues;Handout   Comprehension Verbalized understanding;Returned demonstration          OT Short Term Goals - 12/04/16 1510      OT SHORT TERM GOAL #1   Title Pt will be educated on HEP to improve mobility required for RUE use as dominant during daily tasks.    Time 4   Period Weeks   Status Achieved     OT SHORT TERM GOAL #2   Title Pt will  decrease RUE fascial restrictions from moderate to minimal amounts to improve mobility required for overhead reaching tasks.    Time 4   Period Weeks     OT SHORT TERM GOAL #3   Title Pt will improve A/ROM in RUE to [facility] to improve ability to reach behind back and fasten/unfasten bra.    Time 4   Period Weeks   Status Partially Met     OT SHORT TERM GOAL #4   Title Pt will decrease pain in RUE to 3/10 or less to improve ability to use RUE as dominant during B/IADL task completion.    Time 4   Period Weeks   Status Achieved     OT SHORT TERM GOAL #5   Title Pt will improve RUE strength to 4+/5 to increase ability to use RUE as dominant during heavy work tasks.    Time 4   Period Weeks                  Plan - 12/04/16 1550    Clinical Impression Statement A: Pt reports that her RUE is feeling much better and is interested in graduating from therapy today. Patient has met all therapy goals with the exception of one which she partially met. Pt reports some difficulty with internal rotation (when unhooking/hooking his bra) and horizontal adduction. HEP was reviewed to focus on deficit areas. Pt in agreement with discharge.   Plan P: D/C from therapy with HEP.       Patient will benefit from skilled therapeutic intervention in order to improve the following deficits and impairments:  Decreased strength, Impaired flexibility, Decreased activity tolerance, Decreased mobility, Pain, Decreased range of motion, Impaired UE functional use, Increased fascial restricitons  Visit Diagnosis: Chronic right shoulder pain  Other symptoms and signs involving the musculoskeletal system  Stiffness of right shoulder, not elsewhere classified    Problem List Patient Active Problem List   Diagnosis Date Noted  . Chronic right shoulder pain 08/15/2016  . 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  . Morbid obesity ([place]) 09/12/2015  . Vitamin B 12 deficiency 11/22/2011  .  Vitamin D deficiency 11/22/2011  . FATIGUE 11/28/2009  . Diabetes mellitus type 2 in obese 09/03/2007  . Iron deficiency anemia 09/03/2007  . Allergic rhinitis 09/03/2007  . GERD 09/03/2007   OCCUPATIONAL THERAPY DISCHARGE SUMMARY  Visits from Start of Care: 8  Current functional level related to goals / functional outcomes: See above   Remaining deficits: See above   Education / Equipment: See above Plan: Patient agrees to discharge.  Patient goals were  met. Patient is being discharged due to meeting the stated rehab goals.  ?????         Ailene Ravel, OTR/L,CBIS  [phone]  12/04/2016, 3:58 PM  [place] [address] [place], [place], [address] Phone: [phone]   Fax:  [phone]  Name: JESIKA MEN MRN: [id number] Date of Birth: Feb 05, 1976

## 2016-12-04 NOTE — Patient Instructions (Addendum)
F/u in 4 month, call if you need me efore  HBa1c , chem 7 and EGFR, and Vit D in 4 months non fast   Microalb today  It is important that you exercise regularly at least 30 minutes 7 times a week. If you develop chest pain, have severe difficulty breathing, or feel very tired, stop exercising immediately and seek medical attention    Please work on good  health habits so that your health will improve. 1. Commitment to daily physical activity for 30 to 60  minutes, if you are able to do this.  2. Commitment to wise food choices. Aim for half of your  food intake to be vegetable and fruit, one quarter starchy foods, and one quarter protein. Try to eat on a regular schedule  3 meals per day, snacking between meals should be limited to vegetables or fruits or small portions of nuts. 64 ounces of water per day is generally recommended, unless you have specific health conditions, like heart failure or kidney failure where you will need to limit fluid intake.  3. Commitment to sufficient and a  good quality of physical and mental rest daily, generally between 6 to 8 hours per day.  WITH PERSISTANCE AND PERSEVERANCE, THE IMPOSSIBLE , BECOMES THE NORM!   Weight loss goal of 8 pounds  PLs get pap overdue

## 2016-12-05 LAB — BASIC METABOLIC PANEL WITH GFR
BUN: 14 mg/dL (ref 7–25)
CHLORIDE: 108 mmol/L (ref 98–110)
CO2: 20 mmol/L (ref 20–32)
Calcium: 8.8 mg/dL (ref 8.6–10.2)
Creat: 0.72 mg/dL (ref 0.50–1.10)
GFR, Est African American: >89 mL/min (ref >=60)
GLUCOSE: 129 mg/dL — AB (ref 65–99)
POTASSIUM: 4.1 mmol/L (ref 3.5–5.3)
Sodium: 140 mmol/L (ref 135–146)

## 2016-12-05 LAB — LIPID PANEL
CHOLESTEROL: 150 mg/dL (ref <200)
HDL: 57 mg/dL (ref >50)
LDL Cholesterol: 84 mg/dL (ref <100)
TRIGLYCERIDES: 45 mg/dL (ref <150)
Total CHOL/HDL Ratio: 2.6 Ratio (ref <5.0)
VLDL: 9 mg/dL (ref <30)

## 2016-12-05 LAB — MICROALBUMIN / CREATININE URINE RATIO
CREATININE, UR: 164.2 mg/dL
MICROALB/CREAT RATIO: 2.5 mg/g{creat} (ref 0.0–30.0)
Microalb, Ur: 4.1 ug/mL — ABNORMAL HIGH

## 2016-12-05 LAB — HEMOGLOBIN A1C
Hgb A1c MFr Bld: 6.2 % — ABNORMAL HIGH (ref <5.7)
Mean Plasma Glucose: 131 mg/dL

## 2016-12-06 ENCOUNTER — Telehealth: Payer: Self-pay | Admitting: Family Medicine

## 2016-12-06 ENCOUNTER — Other Ambulatory Visit: Payer: Self-pay | Admitting: Family Medicine

## 2016-12-06 ENCOUNTER — Ambulatory Visit (HOSPITAL_COMMUNITY): Payer: 59

## 2016-12-06 ENCOUNTER — Encounter: Payer: Self-pay | Admitting: Family Medicine

## 2016-12-06 DIAGNOSIS — E1169 Type 2 diabetes mellitus with other specified complication: Secondary | ICD-10-CM

## 2016-12-06 DIAGNOSIS — E669 Obesity, unspecified: Principal | ICD-10-CM

## 2016-12-06 DIAGNOSIS — E559 Vitamin D deficiency, unspecified: Secondary | ICD-10-CM

## 2016-12-06 MED ORDER — METFORMIN HCL ER (MOD) 1000 MG PO TB24: 1000.0000 mg | ORAL_TABLET | Freq: Every day | ORAL | 1 refills | Status: DC

## 2016-12-06 MED ORDER — METFORMIN HCL ER 500 MG PO TB24: ORAL_TABLET | ORAL | 1 refills | Status: DC

## 2016-12-06 NOTE — Telephone Encounter (Signed)
Alicia, [place], called and left message on nurse line regarding patient. She states that they received prescription for metformin 1000mg . She states that this medication is very expensive and not covered under patient's insurance. She asks if it is okay to change to metformin XR 500 mg, two tablets once daily with breakfast.   Callback# [phone]

## 2016-12-06 NOTE — Assessment & Plan Note (Signed)
Deteriorated. Patient re-educated about  the importance of commitment to a  minimum of 150 minutes of exercise per week.  The importance of healthy food choices with portion control discussed. Encouraged to start a food diary, count calories and to consider  joining a support group. Sample diet sheets offered. Goals set by the patient for the next several months.   Weight /BMI 12/04/2016 08/15/2016 08/01/2016  WEIGHT 286 lb 12 oz 280 lb 1.9 oz 284 lb 9.6 oz  HEIGHT 5\' 2"  5\' 2"  5\' 2"   BMI 52.45 kg/m2 51.23 kg/m2 52.05 kg/m2

## 2016-12-06 NOTE — Telephone Encounter (Signed)
Called pharmacy, script prined please fax

## 2016-12-06 NOTE — Progress Notes (Signed)
[name]     MRN: [id number]      DOB: 29-Dec-1975   HPI [name] is here for follow up and re-evaluation of chronic medical conditions, medication management and review of any available recent lab and radiology data.  Preventive health is updated, specifically  Cancer screening and Immunization.  [place]ast due, will schedule Questions or concerns regarding consultations or procedures which the PT has had in the interim are  addressed. The PT denies any adverse reactions to current medications since the last visit.  Not as diligent with food and exercise as planned but will get back on board. 3 day h/o itching and pain in left ear, wants it checked ROS Denies recent fever or chills. Denies sinus pressure, nasal congestion, or sore throat. Denies chest congestion, productive cough or wheezing. Denies chest pains, palpitations and leg swelling Denies abdominal pain, nausea, vomiting,diarrhea or constipation.   Denies dysuria, frequency, hesitancy or incontinence. Denies joint pain, swelling and limitation in mobility. Denies headaches, seizures, numbness, or tingling. Denies depression, anxiety or insomnia. Denies skin break down or rash.   PE  BP (!) 110/58 (BP Location: Right Arm, Patient Position: Sitting, Cuff Size: Large)   Pulse 63   Temp (!) 97.4 F (36.3 C) (Other (Comment))   Resp 16   Ht 5\' 2"  (1.575 m)   Wt 286 lb 12 oz (130.1 kg)   LMP 11/05/2016 (Approximate)   SpO2 99%   BMI 52.45 kg/m   Patient alert and oriented and in no cardiopulmonary distress.  HEENT: No facial asymmetry, EOMI,   oropharynx pink and moist.  Neck supple no JVD, no mass.eryhtema of external ear, mild , due to trauma  Chest: Clear to auscultation bilaterally.  CVS: S1, S2 no murmurs, no S3.Regular rate.  ABD: Soft non tender.   Ext: No edema  MS: Adequate ROM spine, shoulders, hips and knees.  Skin: Intact, no ulcerations or rash noted.  Psych: Good eye contact, normal  affect. Memory intact not anxious or depressed appearing.  CNS: CN 2-12 intact, power,  normal throughout.no focal deficits noted.   Assessment & Plan  Diabetes mellitus type 2 in obese Deteriorated Increase dose of metformin [name] is reminded of the importance of commitment to daily physical activity for 30 minutes or more, as able and the need to limit carbohydrate intake to 30 to 60 grams per meal to help with blood sugar control.   The need to take medication as prescribed, test blood sugar as directed, and to call between visits if there is a concern that blood sugar is uncontrolled is also discussed.   [name] is reminded of the importance of daily foot exam, annual eye examination, and good blood sugar, blood pressure and cholesterol control.  Diabetic Labs Latest Ref Rng & Units 12/04/2016 08/10/2016 04/02/2016 03/28/2016 01/19/2016  HbA1c <5.7 % 6.2(H) 5.5 - 5.8(H) 5.9  Microalbumin Not Estab. ug/mL 4.1(H) - - - -  Micro/Creat Ratio 0.0 - 30.0 mg/g creat 2.5 - - - -  Chol <200 mg/dL 150 - - - -  HDL >50 mg/dL 57 - - - -  Calc LDL <100 mg/dL 84 - - - 83  Triglycerides <150 mg/dL 45 - - - -  Creatinine 0.50 - 1.10 mg/dL 0.72 0.71 0.74 0.70 -   BP/Weight 12/04/2016 08/15/2016 08/01/2016 05/31/2016 05/15/2016 05/14/2016 66/59/9357  Systolic BP 017 793 - - 903 009 -  Diastolic BP 58 78 - - 78 74 -  Wt. (Lbs)  286.75 280.12 284.6 286.6 300 295 298.2  BMI 52.45 51.23 52.05 52.42 58.59 53.1 54.54   Foot/eye exam completion dates Latest Ref Rng & Units 12/04/2016 09/12/2015  Eye Exam No Retinopathy - -  Foot Form Completion - Done Done        Chronic right shoulder pain Improved with therapy  GERD Controlled, no change in medication   Vitamin D deficiency Updated lab needed at/ before next visit.   Morbid obesity ([place]) Deteriorated. Patient re-educated about  the importance of commitment to a  minimum of 150 minutes of exercise per week.  The importance of healthy  food choices with portion control discussed. Encouraged to start a food diary, count calories and to consider  joining a support group. Sample diet sheets offered. Goals set by the patient for the next several months.   Weight /BMI 12/04/2016 08/15/2016 08/01/2016  WEIGHT 286 lb 12 oz 280 lb 1.9 oz 284 lb 9.6 oz  HEIGHT 5\' 2"  5\' 2"  5\' 2"   BMI 52.45 kg/m2 51.23 kg/m2 52.05 kg/m2

## 2016-12-06 NOTE — Assessment & Plan Note (Signed)
Controlled, no change in medication  

## 2016-12-06 NOTE — Telephone Encounter (Signed)
Done

## 2016-12-06 NOTE — Assessment & Plan Note (Signed)
Updated lab needed at/ before next visit.   

## 2016-12-06 NOTE — Assessment & Plan Note (Signed)
Improved with therapy 

## 2016-12-06 NOTE — Telephone Encounter (Signed)
-----  Message from Margaret E Simpson, MD sent at 12/06/2016  5:24 AM EDT ----- pls mail lab order for 3 to 5 days prior to f/u for hBa1C, chem 7 and EGFr and vit D noin fast to her home Also fax new script entered historically to her pharmacy for higher dose of metformin of 1000 mg ER one daily,  ?? pls ask/ see patient message Thanks 

## 2016-12-06 NOTE — Assessment & Plan Note (Signed)
Deteriorated Increase dose of metformin [name] is reminded of the importance of commitment to daily physical activity for 30 minutes or more, as able and the need to limit carbohydrate intake to 30 to 60 grams per meal to help with blood sugar control.   The need to take medication as prescribed, test blood sugar as directed, and to call between visits if there is a concern that blood sugar is uncontrolled is also discussed.   [name] is reminded of the importance of daily foot exam, annual eye examination, and good blood sugar, blood pressure and cholesterol control.  Diabetic Labs Latest Ref Rng & Units 12/04/2016 08/10/2016 04/02/2016 03/28/2016 01/19/2016  HbA1c <5.7 % 6.2(H) 5.5 - 5.8(H) 5.9  Microalbumin Not Estab. ug/mL 4.1(H) - - - -  Micro/Creat Ratio 0.0 - 30.0 mg/g creat 2.5 - - - -  Chol <200 mg/dL 150 - - - -  HDL >50 mg/dL 57 - - - -  Calc LDL <100 mg/dL 84 - - - 83  Triglycerides <150 mg/dL 45 - - - -  Creatinine 0.50 - 1.10 mg/dL 0.72 0.71 0.74 0.70 -   BP/Weight 12/04/2016 08/15/2016 08/01/2016 05/31/2016 05/15/2016 05/14/2016 84/06/7541  Systolic BP 606 770 - - 340 352 -  Diastolic BP 58 78 - - 78 74 -  Wt. (Lbs) 286.75 280.12 284.6 286.6 300 295 298.2  BMI 52.45 51.23 52.05 52.42 58.59 53.1 54.54   Foot/eye exam completion dates Latest Ref Rng & Units 12/04/2016 09/12/2015  Eye Exam No Retinopathy - -  Foot Form Completion - Done Done

## 2016-12-10 ENCOUNTER — Ambulatory Visit (HOSPITAL_COMMUNITY): Payer: 59

## 2016-12-11 MED FILL — METFORMIN HCL ER 500 MG TAB: 500 | 90 days supply | Qty: 180 | Fill #0

## 2016-12-13 ENCOUNTER — Ambulatory Visit: Payer: Self-pay | Admitting: Family Medicine

## 2017-02-05 ENCOUNTER — Encounter (HOSPITAL_COMMUNITY): Payer: Self-pay

## 2017-02-11 ENCOUNTER — Other Ambulatory Visit: Payer: Self-pay

## 2017-02-11 MED ORDER — GLUCOSE BLOOD VI STRP: ORAL_STRIP | 2 refills | Status: DC

## 2017-02-11 MED ORDER — ACCU-CHEK FASTCLIX LANCETS MISC: 2 refills | Status: DC

## 2017-02-11 MED FILL — ACCU-CHEK GUIDE TEST STRIP: 90 days supply | Qty: 100 | Fill #0

## 2017-02-11 MED FILL — ACCU-CHEK FASTCLIX LANCETS: 90 days supply | Qty: 102 | Fill #0

## 2017-03-19 ENCOUNTER — Other Ambulatory Visit: Payer: Self-pay | Admitting: *Deleted

## 2017-03-19 NOTE — Patient Outreach (Addendum)
Sent secure e-mail to [facility] e-mail address and she responded. Advised her that all disease self management services for [place] will be provided either by [facility] or [facility] beginning 04/23/17.  Oanh enrolled in the [facility] platform on 05/18/16 for diabetes self management assistance so will close to the [facility] diabetes program. Barrington Ellison RN,CCM,CDE [place] Management Coordinator [facility] and [employer] [phone] Office Fax [phone]

## 2017-03-27 MED FILL — PANTOPRAZOLE SOD DR 40 MG T: 40 | 90 days supply | Qty: 90 | Fill #2

## 2017-03-27 MED FILL — METFORMIN HCL ER 500 MG TAB: 500 | 90 days supply | Qty: 180 | Fill #1

## 2017-04-08 DIAGNOSIS — H5213 Myopia, bilateral: Secondary | ICD-10-CM | POA: Diagnosis not present

## 2017-04-08 DIAGNOSIS — H524 Presbyopia: Secondary | ICD-10-CM | POA: Diagnosis not present

## 2017-04-08 DIAGNOSIS — E119 Type 2 diabetes mellitus without complications: Secondary | ICD-10-CM | POA: Diagnosis not present

## 2017-04-09 ENCOUNTER — Ambulatory Visit: Payer: Self-pay | Admitting: Family Medicine

## 2017-05-23 ENCOUNTER — Other Ambulatory Visit: Payer: Self-pay

## 2017-05-23 MED ORDER — METFORMIN HCL ER 500 MG PO TB24: ORAL_TABLET | ORAL | 1 refills | Status: DC

## 2017-05-23 MED FILL — METFORMIN HCL ER 500 MG TAB: 500 | 30 days supply | Qty: 60 | Fill #0

## 2017-06-14 MED FILL — PANTOPRAZOLE SOD DR 40 MG T: 40 | 90 days supply | Qty: 90 | Fill #3

## 2017-06-14 MED FILL — METFORMIN HCL ER 500 MG TAB: 500 | 90 days supply | Qty: 180 | Fill #1

## 2017-09-17 MED FILL — METFORMIN HCL ER 500 MG TAB: 500 | 60 days supply | Qty: 120 | Fill #2

## 2017-10-21 ENCOUNTER — Encounter: Payer: Self-pay | Admitting: Family Medicine

## 2017-10-21 ENCOUNTER — Other Ambulatory Visit: Payer: Self-pay

## 2017-10-21 ENCOUNTER — Ambulatory Visit (INDEPENDENT_AMBULATORY_CARE_PROVIDER_SITE_OTHER): Payer: 59 | Admitting: Family Medicine

## 2017-10-21 VITALS — BP 120/70 | HR 59 | Resp 14 | Ht 62.0 in | Wt 316.0 lb

## 2017-10-21 DIAGNOSIS — Z1231 Encounter for screening mammogram for malignant neoplasm of breast: Secondary | ICD-10-CM | POA: Diagnosis not present

## 2017-10-21 DIAGNOSIS — K219 Gastro-esophageal reflux disease without esophagitis: Secondary | ICD-10-CM | POA: Diagnosis not present

## 2017-10-21 DIAGNOSIS — H6692 Otitis media, unspecified, left ear: Secondary | ICD-10-CM | POA: Diagnosis not present

## 2017-10-21 DIAGNOSIS — Z9884 Bariatric surgery status: Secondary | ICD-10-CM

## 2017-10-21 DIAGNOSIS — E559 Vitamin D deficiency, unspecified: Secondary | ICD-10-CM | POA: Diagnosis not present

## 2017-10-21 DIAGNOSIS — E1169 Type 2 diabetes mellitus with other specified complication: Secondary | ICD-10-CM

## 2017-10-21 DIAGNOSIS — E669 Obesity, unspecified: Secondary | ICD-10-CM

## 2017-10-21 MED ORDER — FLUCONAZOLE 150 MG PO TABS: 150.0000 mg | ORAL_TABLET | Freq: Once | ORAL | 0 refills | Status: AC

## 2017-10-21 MED ORDER — METFORMIN HCL 1000 MG PO TABS: 1000.0000 mg | ORAL_TABLET | Freq: Two times a day (BID) | ORAL | 3 refills | Status: DC

## 2017-10-21 MED ORDER — PANTOPRAZOLE SODIUM 40 MG PO TBEC: 40.0000 mg | DELAYED_RELEASE_TABLET | Freq: Every day | ORAL | 3 refills | Status: DC

## 2017-10-21 MED ORDER — AZITHROMYCIN 250 MG PO TABS: ORAL_TABLET | ORAL | 0 refills | Status: DC

## 2017-10-21 MED FILL — PANTOPRAZOLE SOD DR 40 MG T: 40 | 90 days supply | Qty: 90 | Fill #0

## 2017-10-21 MED FILL — metFORMIN HCL 1000 MG TABS: 1000 | 90 days supply | Qty: 180 | Fill #0

## 2017-10-21 MED FILL — AZITHROMYCIN 250 MG TABLET: 250 | 5 days supply | Qty: 6 | Fill #0

## 2017-10-21 MED FILL — FLUCONAZOLE 150 MG TABS: 150 | 1 days supply | Qty: 1 | Fill #0

## 2017-10-21 NOTE — Patient Instructions (Addendum)
F/u in 2 months, call if you need me before,with log book    Please schedule mammogram at checkout   Please commit to regular exercise and change eating habits   You are being referred to diabetic educator in [facility] today CBC, lipid, cmp and EGFr, HBA1C and vit D  Dose increase in metformin to 1000 mg twice daily  Weight loss goal of 6 pounds   You are treated for left ear infection  All the best!  CONGRATULATIONS!!

## 2017-10-22 LAB — COMPLETE METABOLIC PANEL WITH GFR
AG RATIO: 1.5 (calc) (ref 1.0–2.5)
ALKALINE PHOSPHATASE (APISO): 71 U/L (ref 33–115)
ALT: 8 U/L (ref 6–29)
AST: 10 U/L (ref 10–30)
Albumin: 3.9 g/dL (ref 3.6–5.1)
BILIRUBIN TOTAL: 0.5 mg/dL (ref 0.2–1.2)
BUN: 11 mg/dL (ref 7–25)
CHLORIDE: 104 mmol/L (ref 98–110)
CO2: 26 mmol/L (ref 20–32)
Calcium: 9.2 mg/dL (ref 8.6–10.2)
Creat: 0.63 mg/dL (ref 0.50–1.10)
GFR, EST NON AFRICAN AMERICAN: 109 mL/min/{1.73_m2} (ref >=60)
GFR, Est African American: 126 mL/min/{1.73_m2} (ref >=60)
Globulin: 2.6 g/dL (calc) (ref 1.9–3.7)
Glucose, Bld: 199 mg/dL — ABNORMAL HIGH (ref 65–139)
POTASSIUM: 4.5 mmol/L (ref 3.5–5.3)
Sodium: 137 mmol/L (ref 135–146)
Total Protein: 6.5 g/dL (ref 6.1–8.1)

## 2017-10-22 LAB — VITAMIN D 25 HYDROXY (VIT D DEFICIENCY, FRACTURES): Vit D, 25-Hydroxy: 17 ng/mL — ABNORMAL LOW (ref 30–100)

## 2017-10-25 ENCOUNTER — Encounter: Payer: Self-pay | Admitting: Family Medicine

## 2017-10-26 ENCOUNTER — Encounter: Payer: Self-pay | Admitting: Family Medicine

## 2017-10-26 NOTE — Progress Notes (Signed)
JAYEL SCADUTO     MRN: [id number]      DOB: 07-23-1975   HPI Ms. Filip is here for follow up and re-evaluation of chronic medical conditions, medication management and review of any available recent lab and radiology data.  Labs are past due and he has not been I for almost 1 year. She has not been testing blood sugars regularly, has " fallen off the bandwagon" and states her blood sugar is high often near 200 and her weight has gone up " She has not committed to regular exercise She has stepped down in position as far as her job position is concerned , however gets sufficient hours to be  Full time and is focusing on school and raising her children , the older starts college this Fall. Younger child is now diabetic and this has been a wake up Preventive health is updated, needs pap and mammogram, and Immunization is up to date.    The PT denies any adverse reactions to current medications since the last visit.    Denies polyuria, polydipsia, blurred vision , or excessive fatigue She denies depression   ROS Denies recent fever or chills. Denies sinus pressure, nasal congestion, c/o left  ear pain denies  sore throat. Denies chest congestion, productive cough or wheezing. Denies chest pains, palpitations and leg swelling Denies abdominal pain, nausea, vomiting,diarrhea or constipation.   Denies dysuria, frequency, hesitancy or incontinence. Denies joint pain, swelling and limitation in mobility. Denies headaches, seizures, numbness, or tingling. Denies depression, anxiety or insomnia. Denies skin break down or rash.   PE  BP 120/70 (BP Location: Right Arm, Patient Position: Sitting, Cuff Size: Large)   Pulse (!) 59   Resp 14   Ht 5\' 2"  (1.575 m)   Wt (!) 316 lb (143.3 kg)   BMI 57.80 kg/m   Patient alert and oriented and in no cardiopulmonary distress.  HEENT: No facial asymmetry, EOMI,   oropharynx pink and moist.  Neck supple no JVD, no mass.left  TM erythematous , and  light reflex is dull, right TM is normal with good light reflex , sinuses non tender Chest: Clear to auscultation bilaterally.  CVS: S1, S2 no murmurs, no S3.Regular rate.  ABD: Soft non tender.   Ext: No edema  MS: Adequate ROM spine, shoulders, hips and knees.  Skin: Intact, no ulcerations or rash noted.  Psych: Good eye contact, normal affect. Memory intact not anxious or depressed appearing.  CNS: CN 2-12 intact, power,  normal throughout.no focal deficits noted.   Assessment & Plan  Diabetes mellitus type 2 in obese Uncontrolled due to non compliance, will obtain updated lab and re address medical management. Dose of metformin increased to 1000 mg twice daily   Morbid obesity ([place]) Deteriorated.Weight gain of 40 pounds from lowest since surgery, encouraged pt to consider weight watchers she is also referred to diabetic education Patient re-educated about  the importance of commitment to a  minimum of 150 minutes of exercise per week.  The importance of healthy food choices with portion control discussed. Encouraged to start a food diary, count calories and to consider  joining a support group. Sample diet sheets offered. Goals set by the patient for the next several months.   Weight /BMI 10/21/2017 12/04/2016 08/15/2016  WEIGHT 316 lb 286 lb 12 oz 280 lb 1.9 oz  HEIGHT 5\' 2"  5\' 2"  5\' 2"   BMI 57.8 kg/m2 52.45 kg/m2 51.23 kg/m2      Left otitis media  Z pack and fluconazole prescribed  GERD Medication refilled  Vitamin D deficiency Updated lab needed

## 2017-10-27 MED ORDER — ERGOCALCIFEROL 1.25 MG (50000 UT) PO CAPS: 50000.0000 [IU] | ORAL_CAPSULE | ORAL | 3 refills | Status: DC

## 2017-10-27 NOTE — Assessment & Plan Note (Signed)
Medication refilled

## 2017-10-27 NOTE — Assessment & Plan Note (Addendum)
Deteriorated.Weight gain of 40 pounds from lowest since surgery, encouraged pt to consider weight watchers she is also referred to diabetic education Patient re-educated about  the importance of commitment to a  minimum of 150 minutes of exercise per week.  The importance of healthy food choices with portion control discussed. Encouraged to start a food diary, count calories and to consider  joining a support group. Sample diet sheets offered. Goals set by the patient for the next several months.   Weight /BMI 10/21/2017 12/04/2016 08/15/2016  WEIGHT 316 lb 286 lb 12 oz 280 lb 1.9 oz  HEIGHT 5\' 2"  5\' 2"  5\' 2"   BMI 57.8 kg/m2 52.45 kg/m2 51.23 kg/m2

## 2017-10-27 NOTE — Assessment & Plan Note (Signed)
Z pack and fluconazole prescribed

## 2017-10-27 NOTE — Assessment & Plan Note (Signed)
Updated lab needed.  

## 2017-10-27 NOTE — Assessment & Plan Note (Signed)
Uncontrolled due to non compliance, will obtain updated lab and re address medical management. Dose of metformin increased to 1000 mg twice daily

## 2017-10-28 ENCOUNTER — Telehealth: Payer: Self-pay

## 2017-10-28 DIAGNOSIS — E1169 Type 2 diabetes mellitus with other specified complication: Secondary | ICD-10-CM

## 2017-10-28 DIAGNOSIS — E669 Obesity, unspecified: Secondary | ICD-10-CM

## 2017-10-28 MED FILL — VIT D2 1.25 MG (50,000 UNIT: 1.25 MG | 84 days supply | Qty: 12 | Fill #0

## 2017-10-28 NOTE — Telephone Encounter (Signed)
Referral entered for diabetic education per Dr.Simpson

## 2017-10-30 ENCOUNTER — Encounter (HOSPITAL_COMMUNITY): Payer: Self-pay

## 2017-10-30 ENCOUNTER — Ambulatory Visit (HOSPITAL_COMMUNITY)
Admission: RE | Admit: 2017-10-30 | Discharge: 2017-10-30 | Disposition: A | Discharge status: Home / Self Care | Payer: 59 | Source: Ambulatory Visit | Attending: Family Medicine | Admitting: Family Medicine

## 2017-10-30 DIAGNOSIS — Z1231 Encounter for screening mammogram for malignant neoplasm of breast: Secondary | ICD-10-CM | POA: Insufficient documentation

## 2017-11-01 ENCOUNTER — Other Ambulatory Visit (HOSPITAL_COMMUNITY)
Admission: RE | Admit: 2017-11-01 | Discharge: 2017-11-01 | Disposition: A | Discharge status: Home / Self Care | Payer: 59 | Source: Other Acute Inpatient Hospital | Attending: Diagnostic Radiology | Admitting: Diagnostic Radiology

## 2017-11-01 ENCOUNTER — Encounter: Payer: Self-pay | Admitting: Family Medicine

## 2017-11-01 DIAGNOSIS — E559 Vitamin D deficiency, unspecified: Secondary | ICD-10-CM | POA: Diagnosis not present

## 2017-11-01 LAB — CBC
HEMATOCRIT: 39.1 % (ref 36.0–46.0)
HEMOGLOBIN: 12.4 g/dL (ref 12.0–15.0)
MCH: 28.8 pg (ref 26.0–34.0)
MCHC: 31.7 g/dL (ref 30.0–36.0)
MCV: 90.9 fL (ref 78.0–100.0)
PLATELETS: 264 10*3/uL (ref 150–400)
RBC: 4.3 MIL/uL (ref 3.87–5.11)
RDW: 13.3 % (ref 11.5–15.5)
WBC: 6.9 10*3/uL (ref 4.0–10.5)

## 2017-11-01 LAB — HEMOGLOBIN A1C
Hgb A1c MFr Bld: 7.5 % — ABNORMAL HIGH (ref 4.8–5.6)
Mean Plasma Glucose: 168.55 mg/dL

## 2017-11-01 LAB — LIPID PANEL
CHOLESTEROL: 165 mg/dL (ref 0–200)
HDL: 62 mg/dL (ref >40)
LDL Cholesterol: 96 mg/dL (ref 0–99)
TRIGLYCERIDES: 37 mg/dL (ref <150)
Total CHOL/HDL Ratio: 2.7 RATIO
VLDL: 7 mg/dL (ref 0–40)

## 2017-11-02 ENCOUNTER — Encounter: Payer: Self-pay | Admitting: Family Medicine

## 2017-11-04 ENCOUNTER — Other Ambulatory Visit: Payer: Self-pay

## 2017-11-04 MED ORDER — GLUCOSE BLOOD VI STRP: ORAL_STRIP | 2 refills | Status: DC

## 2017-11-04 MED FILL — ACCU-CHEK GUIDE STRP: 90 days supply | Qty: 200 | Fill #0

## 2017-11-08 ENCOUNTER — Other Ambulatory Visit: Payer: Self-pay | Admitting: Family Medicine

## 2017-11-08 MED ORDER — GLIPIZIDE ER 5 MG PO TB24: 5.0000 mg | ORAL_TABLET | Freq: Every day | ORAL | 1 refills | Status: DC

## 2017-11-08 MED FILL — glipiZIDE XL 5 MG TB24: 5 | 90 days supply | Qty: 90 | Fill #0

## 2017-12-17 ENCOUNTER — Telehealth: Payer: Self-pay | Admitting: Nutrition

## 2017-12-17 ENCOUNTER — Ambulatory Visit: Payer: Self-pay | Admitting: Nutrition

## 2017-12-17 NOTE — Telephone Encounter (Signed)
Unable to leave VM No show

## 2017-12-24 ENCOUNTER — Ambulatory Visit: Payer: Self-pay | Admitting: Family Medicine

## 2018-01-02 ENCOUNTER — Ambulatory Visit (INDEPENDENT_AMBULATORY_CARE_PROVIDER_SITE_OTHER): Payer: 59 | Admitting: Family Medicine

## 2018-01-02 ENCOUNTER — Encounter: Payer: Self-pay | Admitting: Family Medicine

## 2018-01-02 VITALS — BP 108/74 | HR 82 | Resp 15 | Ht 62.0 in | Wt 316.0 lb

## 2018-01-02 DIAGNOSIS — E669 Obesity, unspecified: Secondary | ICD-10-CM | POA: Diagnosis not present

## 2018-01-02 DIAGNOSIS — D509 Iron deficiency anemia, unspecified: Secondary | ICD-10-CM

## 2018-01-02 DIAGNOSIS — J309 Allergic rhinitis, unspecified: Secondary | ICD-10-CM | POA: Diagnosis not present

## 2018-01-02 DIAGNOSIS — E1169 Type 2 diabetes mellitus with other specified complication: Secondary | ICD-10-CM | POA: Diagnosis not present

## 2018-01-02 DIAGNOSIS — E559 Vitamin D deficiency, unspecified: Secondary | ICD-10-CM

## 2018-01-02 NOTE — Patient Instructions (Signed)
F/u end January, call if you need me before  Please schedule pap this is past due  Good foot exam today  Please commit to daily exercise for 30 minutes and work on food choice for 8 pound weight loss  Thankful that blood sugar is improved  Microalb from office today  Please  Get non fasting hBA1C , chem 7 and EGFR also iron level Oct 15 or shortly after  Please get non fasting HBA1c , chem 7 and eGFR and vit D   5 days before January visit  Thank you  for choosing [facility]. We consider it a privelige to serve you.  Delivering excellent health care in a caring and  compassionate way is our goal.  Partnering with you,  so that together we can achieve this goal is our strategy.

## 2018-01-03 ENCOUNTER — Encounter: Payer: Self-pay | Admitting: Family Medicine

## 2018-01-03 ENCOUNTER — Other Ambulatory Visit (HOSPITAL_COMMUNITY)
Admission: RE | Admit: 2018-01-03 | Discharge: 2018-01-03 | Disposition: A | Discharge status: Home / Self Care | Payer: 59 | Source: Other Acute Inpatient Hospital | Attending: Family Medicine | Admitting: Family Medicine

## 2018-01-03 DIAGNOSIS — E669 Obesity, unspecified: Secondary | ICD-10-CM | POA: Insufficient documentation

## 2018-01-03 NOTE — Assessment & Plan Note (Signed)
Updated lab needed at/ before next visit. [name] is reminded of the importance of commitment to daily physical activity for 30 minutes or more, as able and the need to limit carbohydrate intake to 30 to 60 grams per meal to help with blood sugar control.   The need to take medication as prescribed, test blood sugar as directed, and to call between visits if there is a concern that blood sugar is uncontrolled is also discussed.   [name] is reminded of the importance of daily foot exam, annual eye examination, and good blood sugar, blood pressure and cholesterol control.  Diabetic Labs Latest Ref Rng & Units 11/01/2017 10/21/2017 12/04/2016 08/10/2016 04/02/2016  HbA1c 4.8 - 5.6 % 7.5(H) - 6.2(H) 5.5 -  Microalbumin Not Estab. ug/mL - - 4.1(H) - -  Micro/Creat Ratio 0.0 - 30.0 mg/g creat - - 2.5 - -  Chol 0 - 200 mg/dL 165 - 150 - -  HDL >40 mg/dL 62 - 57 - -  Calc LDL 0 - 99 mg/dL 96 - 84 - -  Triglycerides <150 mg/dL 37 - 45 - -  Creatinine 0.50 - 1.10 mg/dL - 0.63 0.72 0.71 0.74   BP/Weight 01/02/2018 10/21/2017 12/04/2016 08/15/2016 08/01/2016 05/31/2016 3/50/0938  Systolic BP 182 993 716 967 - - 893  Diastolic BP 74 70 58 78 - - 78  Wt. (Lbs) 316 316 286.75 280.12 284.6 286.6 300  BMI 57.8 57.8 52.45 51.23 52.05 52.42 58.59   Foot/eye exam completion dates Latest Ref Rng & Units 12/04/2016 09/12/2015  Eye Exam No Retinopathy - -  Foot Form Completion - Done Done

## 2018-01-03 NOTE — Assessment & Plan Note (Signed)
Updated lab needed at/ before next visit.   

## 2018-01-03 NOTE — Assessment & Plan Note (Signed)
Intermittent  And mild , uses medication as needed

## 2018-01-03 NOTE — Assessment & Plan Note (Signed)
No longer bleeding heavily and still taking iron Updated lab needed at/ before next visit.

## 2018-01-03 NOTE — Assessment & Plan Note (Signed)
Unchanged Patient re-educated about  the importance of commitment to a  minimum of 150 minutes of exercise per week.  The importance of healthy food choices with portion control discussed. Encouraged to start a food diary, count calories and to consider  joining a support group. Sample diet sheets offered. Goals set by the patient for the next several months.   Weight /BMI 01/02/2018 10/21/2017 12/04/2016  WEIGHT 316 lb 316 lb 286 lb 12 oz  HEIGHT 5\' 2"  5\' 2"  5\' 2"   BMI 57.8 kg/m2 57.8 kg/m2 52.45 kg/m2

## 2018-01-03 NOTE — Progress Notes (Signed)
[name]     MRN: [id number]      DOB: 09-03-1975   HPI [name] is here for follow up and re-evaluation of chronic medical conditions, medication management and review of any available recent lab and radiology data.  Preventive health is updated, specifically  Cancer screening and Immunization. Needs pap and will get flu vaccine on the job  Questions or concerns regarding consultations or procedures which the PT has had in the interim are  addressed. The PT denies any adverse reactions to current medications since the last visit.  There are no new concerns.  Not exercising regularly Blood sugar being tested regularly and FBG is averaging around 110    ROS Denies recent fever or chills. Denies sinus pressure, nasal congestion, ear pain or sore throat. Denies chest congestion, productive cough or wheezing. Denies chest pains, palpitations and leg swelling Denies abdominal pain, nausea, vomiting,diarrhea or constipation.   Denies dysuria, frequency, hesitancy or incontinence. Denies joint pain, swelling and limitation in mobility. Denies headaches, seizures, numbness, or tingling. Denies depression, anxiety or insomnia. Denies skin break down or rash.   PE  BP 108/74   Pulse 82   Resp 15   Ht 5\' 2"  (1.575 m)   Wt (!) 316 lb (143.3 kg)   SpO2 100%   BMI 57.80 kg/m   Patient alert and oriented and in no cardiopulmonary distress.  HEENT: No facial asymmetry, EOMI,   oropharynx pink and moist.  Neck supple no JVD, no mass.  Chest: Clear to auscultation bilaterally.  CVS: S1, S2 no murmurs, no S3.Regular rate.  ABD: Soft non tender.   Ext: No edema  MS: Adequate ROM spine, shoulders, hips and knees.  Skin: Intact, no ulcerations or rash noted.  Psych: Good eye contact, normal affect. Memory intact not anxious or depressed appearing.  CNS: CN 2-12 intact, power,  normal throughout.no focal deficits noted.   Assessment & Plan  Diabetes mellitus type 2 in  obese Updated lab needed at/ before next visit. [name] is reminded of the importance of commitment to daily physical activity for 30 minutes or more, as able and the need to limit carbohydrate intake to 30 to 60 grams per meal to help with blood sugar control.   The need to take medication as prescribed, test blood sugar as directed, and to call between visits if there is a concern that blood sugar is uncontrolled is also discussed.   [name] is reminded of the importance of daily foot exam, annual eye examination, and good blood sugar, blood pressure and cholesterol control.  Diabetic Labs Latest Ref Rng & Units 11/01/2017 10/21/2017 12/04/2016 08/10/2016 04/02/2016  HbA1c 4.8 - 5.6 % 7.5(H) - 6.2(H) 5.5 -  Microalbumin Not Estab. ug/mL - - 4.1(H) - -  Micro/Creat Ratio 0.0 - 30.0 mg/g creat - - 2.5 - -  Chol 0 - 200 mg/dL 165 - 150 - -  HDL >40 mg/dL 62 - 57 - -  Calc LDL 0 - 99 mg/dL 96 - 84 - -  Triglycerides <150 mg/dL 37 - 45 - -  Creatinine 0.50 - 1.10 mg/dL - 0.63 0.72 0.71 0.74   BP/Weight 01/02/2018 10/21/2017 12/04/2016 08/15/2016 08/01/2016 05/31/2016 7/41/2878  Systolic BP 676 720 947 096 - - 283  Diastolic BP 74 70 58 78 - - 78  Wt. (Lbs) 316 316 286.75 280.12 284.6 286.6 300  BMI 57.8 57.8 52.45 51.23 52.05 52.42 58.59   Foot/eye exam completion dates Latest  Ref Rng & Units 12/04/2016 09/12/2015  Eye Exam No Retinopathy - -  Foot Form Completion - Done Done        Morbid obesity ([facility]) Unchanged Patient re-educated about  the importance of commitment to a  minimum of 150 minutes of exercise per week.  The importance of healthy food choices with portion control discussed. Encouraged to start a food diary, count calories and to consider  joining a support group. Sample diet sheets offered. Goals set by the patient for the next several months.   Weight /BMI 01/02/2018 10/21/2017 12/04/2016  WEIGHT 316 lb 316 lb 286 lb 12 oz  HEIGHT 5\' 2"  5\' 2"  5\' 2"   BMI 57.8 kg/m2 57.8  kg/m2 52.45 kg/m2      Iron deficiency anemia No longer bleeding heavily and still taking iron Updated lab needed at/ before next visit.   Allergic rhinitis Intermittent  And mild , uses medication as needed  Vitamin D deficiency Updated lab needed at/ before next visit.

## 2018-01-04 LAB — MICROALBUMIN / CREATININE URINE RATIO
Creatinine, Urine: 191.2 mg/dL
MICROALB UR: 6.9 ug/mL — AB
Microalb Creat Ratio: 3.6 mg/g creat (ref 0.0–30.0)

## 2018-01-07 NOTE — Addendum Note (Signed)
Addended by: Eual Fines on: 01/07/2018 03:14 PM   Modules accepted: Orders

## 2018-01-15 ENCOUNTER — Other Ambulatory Visit: Payer: Self-pay | Admitting: Family Medicine

## 2018-01-15 MED FILL — PANTOPRAZOLE SOD DR 40 MG T: 40 | 90 days supply | Qty: 90 | Fill #1

## 2018-01-15 MED FILL — metFORMIN HCL 1000 MG TABS: 1000 | 90 days supply | Qty: 180 | Fill #1

## 2018-01-15 MED FILL — VIT D2 1.25 MG (50,000 UNIT: 1.25 MG | 84 days supply | Qty: 12 | Fill #1

## 2018-01-16 MED FILL — FLUTICASONE PROP 50 MCG SPR: 50 | 90 days supply | Qty: 48 | Fill #0

## 2018-01-16 MED FILL — DICLOFENAC SODIUM 1% GEL: 1 | 14 days supply | Qty: 300 | Fill #0

## 2018-02-19 MED FILL — glipiZIDE XL 5 MG TB24: 5 | 90 days supply | Qty: 90 | Fill #1

## 2018-04-14 MED FILL — PANTOPRAZOLE SOD DR 40 MG T: 40 | 90 days supply | Qty: 90 | Fill #2

## 2018-04-30 ENCOUNTER — Encounter: Payer: Self-pay | Admitting: Family Medicine

## 2018-04-30 ENCOUNTER — Other Ambulatory Visit (HOSPITAL_COMMUNITY)
Admission: AD | Admit: 2018-04-30 | Discharge: 2018-04-30 | Disposition: A | Discharge status: Home / Self Care | Payer: 59 | Source: Other Acute Inpatient Hospital | Attending: Family Medicine | Admitting: Family Medicine

## 2018-04-30 DIAGNOSIS — E1169 Type 2 diabetes mellitus with other specified complication: Secondary | ICD-10-CM | POA: Diagnosis not present

## 2018-04-30 DIAGNOSIS — E669 Obesity, unspecified: Secondary | ICD-10-CM | POA: Diagnosis not present

## 2018-04-30 DIAGNOSIS — E559 Vitamin D deficiency, unspecified: Secondary | ICD-10-CM | POA: Insufficient documentation

## 2018-04-30 LAB — BASIC METABOLIC PANEL
Anion gap: 7 (ref 5–15)
BUN: 13 mg/dL (ref 6–20)
CALCIUM: 8.8 mg/dL — AB (ref 8.9–10.3)
CHLORIDE: 105 mmol/L (ref 98–111)
CO2: 24 mmol/L (ref 22–32)
CREATININE: 0.6 mg/dL (ref 0.44–1.00)
GFR calc Af Amer: >60 mL/min (ref >60)
GFR calc non Af Amer: >60 mL/min (ref >60)
Glucose, Bld: 100 mg/dL — ABNORMAL HIGH (ref 70–99)
Potassium: 3.9 mmol/L (ref 3.5–5.1)
SODIUM: 136 mmol/L (ref 135–145)

## 2018-04-30 LAB — VITAMIN B12: Vitamin B-12: 151 pg/mL — ABNORMAL LOW (ref 180–914)

## 2018-04-30 LAB — IRON: IRON: 23 ug/dL — AB (ref 28–170)

## 2018-05-01 ENCOUNTER — Ambulatory Visit (INDEPENDENT_AMBULATORY_CARE_PROVIDER_SITE_OTHER): Payer: 59 | Admitting: Family Medicine

## 2018-05-01 ENCOUNTER — Encounter: Payer: Self-pay | Admitting: Family Medicine

## 2018-05-01 VITALS — BP 110/64 | HR 66 | Resp 16 | Ht 62.0 in | Wt 319.0 lb

## 2018-05-01 DIAGNOSIS — E1169 Type 2 diabetes mellitus with other specified complication: Secondary | ICD-10-CM | POA: Diagnosis not present

## 2018-05-01 DIAGNOSIS — E538 Deficiency of other specified B group vitamins: Secondary | ICD-10-CM

## 2018-05-01 DIAGNOSIS — D509 Iron deficiency anemia, unspecified: Secondary | ICD-10-CM

## 2018-05-01 DIAGNOSIS — E559 Vitamin D deficiency, unspecified: Secondary | ICD-10-CM

## 2018-05-01 DIAGNOSIS — Z23 Encounter for immunization: Secondary | ICD-10-CM | POA: Diagnosis not present

## 2018-05-01 DIAGNOSIS — E669 Obesity, unspecified: Secondary | ICD-10-CM

## 2018-05-01 DIAGNOSIS — Z13220 Encounter for screening for lipoid disorders: Secondary | ICD-10-CM | POA: Diagnosis not present

## 2018-05-01 LAB — HEMOGLOBIN A1C
HEMOGLOBIN A1C: 5.9 % — AB (ref 4.8–5.6)
Mean Plasma Glucose: 122.63 mg/dL

## 2018-05-01 MED ORDER — GLUCOSE BLOOD VI STRP: ORAL_STRIP | 5 refills | Status: AC

## 2018-05-01 MED ORDER — ACCU-CHEK FASTCLIX LANCETS MISC: 5 refills | Status: AC

## 2018-05-01 MED ORDER — ROSUVASTATIN CALCIUM 5 MG PO TABS: ORAL_TABLET | ORAL | 3 refills | Status: DC

## 2018-05-01 MED FILL — ACCU-CHEK GUIDE STRP: 90 days supply | Qty: 100 | Fill #0

## 2018-05-01 MED FILL — ROSUVASTATIN CALCIUM 5 MG T: 5 | 84 days supply | Qty: 36 | Fill #0

## 2018-05-01 MED FILL — ACCU-CHEK FASTCLIX LANCETS: 90 days supply | Qty: 102 | Fill #0

## 2018-05-01 NOTE — Patient Instructions (Signed)
F/U in 3.5 months, call if you need me before   TdAP today  Fasting lipid, cmp and EgFR, hBA1C, cBC, iron , ferritin and B12 1 week before next visit  Change to plant based eating 80 % and work on cutting sugar WAY back  12 hrs eating  Please commit to taking iron, and B12 every day

## 2018-05-02 ENCOUNTER — Other Ambulatory Visit: Payer: Self-pay | Admitting: Family Medicine

## 2018-05-02 ENCOUNTER — Encounter: Payer: Self-pay | Admitting: Family Medicine

## 2018-05-02 LAB — VITAMIN D 25 HYDROXY (VIT D DEFICIENCY, FRACTURES): Vit D, 25-Hydroxy: 9.3 ng/mL — ABNORMAL LOW (ref 30.0–100.0)

## 2018-05-02 MED ORDER — ERGOCALCIFEROL 1.25 MG (50000 UT) PO CAPS: 50000.0000 [IU] | ORAL_CAPSULE | ORAL | 3 refills | Status: DC

## 2018-05-02 MED FILL — VIT D2 1.25 MG (50,000 UNIT: 1.25 MG | 84 days supply | Qty: 12 | Fill #0

## 2018-05-02 NOTE — Progress Notes (Signed)
Vit d 

## 2018-05-03 ENCOUNTER — Encounter: Payer: Self-pay | Admitting: Family Medicine

## 2018-05-03 NOTE — Assessment & Plan Note (Signed)
Uncorrected ,needs to take daily iron

## 2018-05-03 NOTE — Assessment & Plan Note (Signed)
Improved and over corrected Stop glipizide [name] is reminded of the importance of commitment to daily physical activity for 30 minutes or more, as able and the need to limit carbohydrate intake to 30 to 60 grams per meal to help with blood sugar control.   The need to take medication as prescribed, test blood sugar as directed, and to call between visits if there is a concern that blood sugar is uncontrolled is also discussed.   [name] is reminded of the importance of daily foot exam, annual eye examination, and good blood sugar, blood pressure and cholesterol control.  Diabetic Labs Latest Ref Rng & Units 04/30/2018 01/03/2018 11/01/2017 10/21/2017 12/04/2016  HbA1c 4.8 - 5.6 % 5.9(H) - 7.5(H) - 6.2(H)  Microalbumin Not Estab. ug/mL - 6.9(H) - - 4.1(H)  Micro/Creat Ratio 0.0 - 30.0 mg/g creat - 3.6 - - 2.5  Chol 0 - 200 mg/dL - - 165 - 150  HDL >40 mg/dL - - 62 - 57  Calc LDL 0 - 99 mg/dL - - 96 - 84  Triglycerides <150 mg/dL - - 37 - 45  Creatinine 0.44 - 1.00 mg/dL 0.60 - - 0.63 0.72   BP/Weight 05/01/2018 01/02/2018 10/21/2017 12/04/2016 08/15/2016 4/40/1027 05/28/3662  Systolic BP 403 474 259 563 875 - -  Diastolic BP 64 74 70 58 78 - -  Wt. (Lbs) 319 316 316 286.75 280.12 284.6 286.6  BMI 58.35 57.8 57.8 52.45 51.23 52.05 52.42   Foot/eye exam completion dates Latest Ref Rng & Units 01/02/2018 12/04/2016  Eye Exam No Retinopathy - -  Foot Form Completion - Done Done      Updated lab needed at/ before next visit.

## 2018-05-03 NOTE — Assessment & Plan Note (Signed)
After obtaining informed consent, the vaccine is  administered 

## 2018-05-03 NOTE — Assessment & Plan Note (Signed)
Deteriorated. Patient re-educated about  the importance of commitment to a  minimum of 150 minutes of exercise per week.  The importance of healthy food choices with portion control discussed. Encouraged to start a food diary, count calories and to consider  joining a support group. Sample diet sheets offered. Goals set by the patient for the next several months.   Weight /BMI 05/01/2018 01/02/2018 10/21/2017  WEIGHT 319 lb 316 lb 316 lb  HEIGHT 5\' 2"  5\' 2"  5\' 2"   BMI 58.35 kg/m2 57.8 kg/m2 57.8 kg/m2

## 2018-05-03 NOTE — Assessment & Plan Note (Signed)
Uncorrected, needs to commit to weekly vit D

## 2018-05-03 NOTE — Assessment & Plan Note (Signed)
Needs to commit to daily B12 supplement

## 2018-05-03 NOTE — Progress Notes (Signed)
[name]     MRN: [id number]      DOB: 1976-02-17   HPI [name] is here for follow up and re-evaluation of chronic medical conditions, medication management and review of any available recent lab and radiology data.  Preventive health is updated, specifically  Cancer screening and Immunization.   Questions or concerns regarding consultations or procedures which the PT has had in the interim are  addressed. The PT denies any adverse reactions to current medications since the last visit.  There are no new concerns.  There are no specific complaints  Denies polyuria, polydipsia, blurred vision , or hypoglycemic episodes.   ROS Denies recent fever or chills. Denies sinus pressure, nasal congestion, ear pain or sore throat. Denies chest congestion, productive cough or wheezing. Denies chest pains, palpitations and leg swelling Denies abdominal pain, nausea, vomiting,diarrhea or constipation.   Denies dysuria, frequency, hesitancy or incontinence. Denies joint pain, swelling and limitation in mobility. Denies headaches, seizures, numbness, or tingling. Denies depression, anxiety or insomnia. Denies skin break down or rash.   PE  BP 110/64   Pulse 66   Resp 16   Ht 5\' 2"  (1.575 m)   Wt (!) 319 lb (144.7 kg)   SpO2 98%   BMI 58.35 kg/m   Patient alert and oriented and in no cardiopulmonary distress.  HEENT: No facial asymmetry, EOMI,   oropharynx pink and moist.  Neck supple no JVD, no mass.  Chest: Clear to auscultation bilaterally.  CVS: S1, S2 no murmurs, no S3.Regular rate.  ABD: Soft non tender.   Ext: No edema  MS: Adequate ROM spine, shoulders, hips and knees.  Skin: Intact, no ulcerations or rash noted.  Psych: Good eye contact, normal affect. Memory intact not anxious or depressed appearing.  CNS: CN 2-12 intact, power,  normal throughout.no focal deficits noted.   Assessment & Plan  Diabetes mellitus type 2 in obese Improved and over  corrected Stop glipizide [name] is reminded of the importance of commitment to daily physical activity for 30 minutes or more, as able and the need to limit carbohydrate intake to 30 to 60 grams per meal to help with blood sugar control.   The need to take medication as prescribed, test blood sugar as directed, and to call between visits if there is a concern that blood sugar is uncontrolled is also discussed.   [name] is reminded of the importance of daily foot exam, annual eye examination, and good blood sugar, blood pressure and cholesterol control.  Diabetic Labs Latest Ref Rng & Units 04/30/2018 01/03/2018 11/01/2017 10/21/2017 12/04/2016  HbA1c 4.8 - 5.6 % 5.9(H) - 7.5(H) - 6.2(H)  Microalbumin Not Estab. ug/mL - 6.9(H) - - 4.1(H)  Micro/Creat Ratio 0.0 - 30.0 mg/g creat - 3.6 - - 2.5  Chol 0 - 200 mg/dL - - 165 - 150  HDL >40 mg/dL - - 62 - 57  Calc LDL 0 - 99 mg/dL - - 96 - 84  Triglycerides <150 mg/dL - - 37 - 45  Creatinine 0.44 - 1.00 mg/dL 0.60 - - 0.63 0.72   BP/Weight 05/01/2018 01/02/2018 10/21/2017 12/04/2016 08/15/2016 1/32/4401 0/05/7251  Systolic BP 664 403 474 259 563 - -  Diastolic BP 64 74 70 58 78 - -  Wt. (Lbs) 319 316 316 286.75 280.12 284.6 286.6  BMI 58.35 57.8 57.8 52.45 51.23 52.05 52.42   Foot/eye exam completion dates Latest Ref Rng & Units 01/02/2018 12/04/2016  Eye Exam No  Retinopathy - -  Foot Form Completion - Done Done      Updated lab needed at/ before next visit.   Morbid obesity ([place]) Deteriorated. Patient re-educated about  the importance of commitment to a  minimum of 150 minutes of exercise per week.  The importance of healthy food choices with portion control discussed. Encouraged to start a food diary, count calories and to consider  joining a support group. Sample diet sheets offered. Goals set by the patient for the next several months.   Weight /BMI 05/01/2018 01/02/2018 10/21/2017  WEIGHT 319 lb 316 lb 316 lb  HEIGHT 5\' 2"  5\' 2"  5\' 2"     BMI 58.35 kg/m2 57.8 kg/m2 57.8 kg/m2      Need for diphtheria-tetanus-pertussis (Tdap) vaccine After obtaining informed consent, the vaccine is  administered    Vitamin B 12 deficiency Needs to commit to daily B12 supplement  Vitamin D deficiency Uncorrected, needs to commit to weekly vit D  Iron deficiency anemia Uncorrected ,needs to take daily iron

## 2018-05-08 ENCOUNTER — Other Ambulatory Visit: Payer: Self-pay | Admitting: Family Medicine

## 2018-05-08 MED FILL — metFORMIN HCL 1000 MG TABS: 1000 | 90 days supply | Qty: 180 | Fill #2

## 2018-06-09 LAB — HM DIABETES EYE EXAM

## 2018-07-03 ENCOUNTER — Telehealth: Payer: Self-pay | Admitting: Women's Health

## 2018-07-03 NOTE — Telephone Encounter (Signed)
Patient called, scheduled a pap/physical for 07/21/18. She has an IUD that she states will be in five years at the end of the month.  She wants to know how much longer she can have it and still be affective bc.  [phone]

## 2018-07-07 NOTE — Telephone Encounter (Signed)
I discussed patients concern about iud expiring with Dr. Ernestina Patches. She advised patient that she was still protected by the mirena but should have it replaced soon due to manufacturer recommendations. Patient informed. She is coming in for [facility] on 3/30. She will followup as planned and schedule for IUD insert after that.

## 2018-07-21 ENCOUNTER — Other Ambulatory Visit: Payer: Self-pay | Admitting: Women's Health

## 2018-07-28 MED FILL — PANTOPRAZOLE SOD DR 40 MG T: 40 | 90 days supply | Qty: 90 | Fill #0

## 2018-08-12 MED FILL — metFORMIN HCL 1000 MG TABS: 1000 | 90 days supply | Qty: 180 | Fill #0

## 2018-08-20 ENCOUNTER — Ambulatory Visit: Payer: 59 | Admitting: Family Medicine

## 2018-08-25 DIAGNOSIS — E538 Deficiency of other specified B group vitamins: Secondary | ICD-10-CM | POA: Diagnosis not present

## 2018-08-25 DIAGNOSIS — Z13220 Encounter for screening for lipoid disorders: Secondary | ICD-10-CM | POA: Diagnosis not present

## 2018-08-25 DIAGNOSIS — E669 Obesity, unspecified: Secondary | ICD-10-CM | POA: Diagnosis not present

## 2018-08-25 DIAGNOSIS — D509 Iron deficiency anemia, unspecified: Secondary | ICD-10-CM | POA: Diagnosis not present

## 2018-08-25 DIAGNOSIS — E1169 Type 2 diabetes mellitus with other specified complication: Secondary | ICD-10-CM | POA: Diagnosis not present

## 2018-08-26 LAB — COMPLETE METABOLIC PANEL WITH GFR
AG Ratio: 1.4 (calc) (ref 1.0–2.5)
ALT: 6 U/L (ref 6–29)
AST: 8 U/L — AB (ref 10–35)
Albumin: 3.8 g/dL (ref 3.6–5.1)
Alkaline phosphatase (APISO): 61 U/L (ref 31–125)
BUN: 13 mg/dL (ref 7–25)
CO2: 27 mmol/L (ref 20–32)
CREATININE: 0.61 mg/dL (ref 0.50–1.10)
Calcium: 8.8 mg/dL (ref 8.6–10.2)
Chloride: 106 mmol/L (ref 98–110)
GFR, EST AFRICAN AMERICAN: 127 mL/min/{1.73_m2} (ref >=60)
GFR, EST NON AFRICAN AMERICAN: 109 mL/min/{1.73_m2} (ref >=60)
Globulin: 2.8 g/dL (calc) (ref 1.9–3.7)
Glucose, Bld: 159 mg/dL — ABNORMAL HIGH (ref 65–99)
Potassium: 4.3 mmol/L (ref 3.5–5.3)
Sodium: 138 mmol/L (ref 135–146)
TOTAL PROTEIN: 6.6 g/dL (ref 6.1–8.1)
Total Bilirubin: 0.4 mg/dL (ref 0.2–1.2)

## 2018-08-26 LAB — HEMOGLOBIN A1C
EAG (MMOL/L): 9.2 (calc)
Hgb A1c MFr Bld: 7.4 % of total Hgb — ABNORMAL HIGH (ref <5.7)
Mean Plasma Glucose: 166 (calc)

## 2018-08-26 LAB — CBC
HEMATOCRIT: 34.7 % — AB (ref 35.0–45.0)
HEMOGLOBIN: 11.2 g/dL — AB (ref 11.7–15.5)
MCH: 28.1 pg (ref 27.0–33.0)
MCHC: 32.3 g/dL (ref 32.0–36.0)
MCV: 87.2 fL (ref 80.0–100.0)
MPV: 11 fL (ref 7.5–12.5)
Platelets: 281 10*3/uL (ref 140–400)
RBC: 3.98 10*6/uL (ref 3.80–5.10)
RDW: 12.7 % (ref 11.0–15.0)
WBC: 6.2 10*3/uL (ref 3.8–10.8)

## 2018-08-26 LAB — FERRITIN: FERRITIN: 41 ng/mL (ref 16–232)

## 2018-08-26 LAB — LIPID PANEL
Cholesterol: 155 mg/dL (ref <200)
HDL: 50 mg/dL (ref >=50)
LDL CHOLESTEROL (CALC): 91 mg/dL
NON-HDL CHOLESTEROL (CALC): 105 mg/dL (ref <130)
Total CHOL/HDL Ratio: 3.1 (calc) (ref <5.0)
Triglycerides: 46 mg/dL (ref <150)

## 2018-08-26 LAB — VITAMIN B12: VITAMIN B 12: 263 pg/mL (ref 200–1100)

## 2018-08-26 LAB — IRON: Iron: 52 ug/dL (ref 40–190)

## 2018-09-01 ENCOUNTER — Encounter: Payer: Self-pay | Admitting: Family Medicine

## 2018-09-01 ENCOUNTER — Ambulatory Visit (INDEPENDENT_AMBULATORY_CARE_PROVIDER_SITE_OTHER): Payer: 59 | Admitting: Family Medicine

## 2018-09-01 VITALS — BP 110/64 | Ht 62.0 in | Wt 306.0 lb

## 2018-09-01 DIAGNOSIS — E785 Hyperlipidemia, unspecified: Secondary | ICD-10-CM | POA: Diagnosis not present

## 2018-09-01 DIAGNOSIS — J309 Allergic rhinitis, unspecified: Secondary | ICD-10-CM | POA: Diagnosis not present

## 2018-09-01 DIAGNOSIS — E1169 Type 2 diabetes mellitus with other specified complication: Secondary | ICD-10-CM | POA: Diagnosis not present

## 2018-09-01 DIAGNOSIS — E669 Obesity, unspecified: Secondary | ICD-10-CM

## 2018-09-01 DIAGNOSIS — D509 Iron deficiency anemia, unspecified: Secondary | ICD-10-CM | POA: Diagnosis not present

## 2018-09-01 DIAGNOSIS — E538 Deficiency of other specified B group vitamins: Secondary | ICD-10-CM

## 2018-09-01 DIAGNOSIS — Z1231 Encounter for screening mammogram for malignant neoplasm of breast: Secondary | ICD-10-CM

## 2018-09-01 DIAGNOSIS — Z7189 Other specified counseling: Secondary | ICD-10-CM

## 2018-09-01 MED ORDER — GLIPIZIDE ER 5 MG PO TB24: 5.0000 mg | ORAL_TABLET | Freq: Every day | ORAL | 3 refills | Status: DC

## 2018-09-01 MED FILL — glipiZIDE ER 5 MG TB24: 5 | 90 days supply | Qty: 90 | Fill #0

## 2018-09-01 NOTE — Patient Instructions (Addendum)
F/U last week In September , call if you need me before  Congrats on labs, however , new additional medication for diabetes glipizide 5 mg with breakfast, continue ,metformin as before , and reduce fried and fatty food intake  Your iron and B12  Levels have improved , so continue oral supplement and commit to taking regularly  You are referred for your mammogram and appt is being mailed   PLEASE GET NON FASTING CMP AND egfr , Hba1c  AND MICROALB sEPT 19 OR SHORTLY  AFTER  We are sending  For your Feb/March eye exam done at [facility] in [place]  It is important that you exercise regularly at least 30 minutes 5 times a week. If you develop chest pain, have severe difficulty breathing, or feel very tired, stop exercising immediately and seek medical attention  Think about what you will eat, plan ahead. Choose " clean, green, fresh or frozen" over canned, processed or packaged foods which are more sugary, salty and fatty. 70 to 75% of food eaten should be vegetables and fruit. Three meals at set times with snacks allowed between meals, but they must be fruit or vegetables. Aim to eat over a 12 hour period , example 7 am to 7 pm, and STOP after  your last meal of the day. Drink water,generally about 64 ounces per day, no other drink is as healthy. Fruit juice is best enjoyed in a healthy way, by EATING the fruit.   Thanks for choosing [facility], we consider it a privelige to serve you.

## 2018-09-01 NOTE — Progress Notes (Signed)
Virtual Visit via Telephone Note  I connected with [name] on 09/01/18 at  3:00 PM EDT by telephone and verified that I am speaking with the correct person using two identifiers.  Location: Patient:home Provider: office  This visit type is conducted due to national recommendations for restrictions regarding the COVID -19 Pandemic. Due to the patient's age and / or co morbidities, this format is felt to be most appropriate at this time without adequate follow up. The patient has no access to video technology/ had technical difficulties with video, requiring transitioning to audio format  only ( telephone ). All issues noted this document were discussed and addressed,no physical exam can be performed in this format.    I discussed the limitations, risks, security and privacy concerns of performing an evaluation and management service by telephone and the availability of in person appointments. I also discussed with the patient that there may be a patient responsible charge related to this service. The patient expressed understanding and agreed to proceed.   History of Present Illness: F/u chronic problems Denies recent fever or chills. Denies sinus pressure, nasal congestion, ear pain or sore throat. Denies chest congestion, productive cough or wheezing. Denies chest pains, palpitations and leg swelling Denies abdominal pain, nausea, vomiting,diarrhea or constipation.   Denies dysuria, frequency, hesitancy or incontinence. Denies joint pain, swelling and limitation in mobility. Denies headaches, seizures, numbness, or tingling. Denies depression, anxiety or insomnia. Denies skin break down or rash. Denies polyuria, polydipsia, blurred vision , or hypoglycemic episodes.      Observations/Objective: BP 110/64   Ht 5\' 2"  (1.575 m)   Wt (!) 306 lb (138.8 kg)   BMI 55.97 kg/m  Good communication with no confusion and intact memory. Alert and oriented x 3 No signs of  respiratory distress during sppech    Assessment and Plan: Diabetes mellitus type 2 in obese [name] is reminded of the importance of commitment to daily physical activity for 30 minutes or more, as able and the need to limit carbohydrate intake to 30 to 60 grams per meal to help with blood sugar control.   The need to take medication as prescribed, test blood sugar as directed, and to call between visits if there is a concern that blood sugar is uncontrolled is also discussed.   [name] is reminded of the importance of daily foot exam, annual eye examination, and good blood sugar, blood pressure and cholesterol control. Add glipizide daily  Diabetic Labs Latest Ref Rng & Units 08/25/2018 04/30/2018 01/03/2018 11/01/2017 10/21/2017  HbA1c <5.7 % of total Hgb 7.4(H) 5.9(H) - 7.5(H) -  Microalbumin Not Estab. ug/mL - - 6.9(H) - -  Micro/Creat Ratio 0.0 - 30.0 mg/g creat - - 3.6 - -  Chol <200 mg/dL 155 - - 165 -  HDL > OR = 50 mg/dL 50 - - 62 -  Calc LDL mg/dL (calc) 91 - - 96 -  Triglycerides <150 mg/dL 46 - - 37 -  Creatinine 0.50 - 1.10 mg/dL 0.61 0.60 - - 0.63   BP/Weight 09/01/2018 05/01/2018 01/02/2018 10/21/2017 12/04/2016 08/15/2016 5/62/1308  Systolic BP 657 846 962 952 841 324 -  Diastolic BP 64 64 74 70 58 78 -  Wt. (Lbs) 306 319 316 316 286.75 280.12 284.6  BMI 55.97 58.35 57.8 57.8 52.45 51.23 52.05   Foot/eye exam completion dates Latest Ref Rng & Units 01/02/2018 12/04/2016  Eye Exam No Retinopathy - -  Foot Form Completion - Done Done  Morbid obesity ([place]) Improved, she is applauded on this Patient re-educated about  the importance of commitment to a  minimum of 150 minutes of exercise per week as able.  The importance of healthy food choices with portion control discussed, as well as eating regularly and within a 12 hour window most days. The need to choose "clean , green" food 50 to 75% of the time is discussed, as well as to make water the primary drink and set a  goal of 64 ounces water daily.  Encouraged to start a food diary,  and to consider  joining a support group. Sample diet sheets offered. Goals set by the patient for the next several months.   Weight /BMI 09/01/2018 05/01/2018 01/02/2018  WEIGHT 306 lb 319 lb 316 lb  HEIGHT 5\' 2"  5\' 2"  5\' 2"   BMI 55.97 kg/m2 58.35 kg/m2 57.8 kg/m2      Vitamin B 12 deficiency Improved, continue supplement  Iron deficiency anemia Improved, continue supplement  Allergic rhinitis Increased during Spring, as expected, needs to take medication daily, as prescribed  Dyslipidemia Hyperlipidemia:Low fat diet discussed and encouraged.   Lipid Panel  Lab Results  Component Value Date   CHOL 155 08/25/2018   HDL 50 08/25/2018   LDLCALC 91 08/25/2018   TRIG 46 08/25/2018   CHOLHDL 3.1 08/25/2018  Controlled, no change in medication      Educated About Covid-19 Virus Infection Covid-19 Education  The signs and symptoms of of COVID -19 were discussed with the patient and how to seek care for testing. ( follow up with PCP or arrange  E-visit) The importance of social  distancing is discussed today.     Follow Up Instructions:    I discussed the assessment and treatment plan with the patient. The patient was provided an opportunity to ask questions and all were answered. The patient agreed with the plan and demonstrated an understanding of the instructions.   The patient was advised to call back or seek an in-person evaluation if the symptoms worsen or if the condition fails to improve as anticipated.  I provided 25 minutes of non-face-to-face time during this encounter.   Tula Nakayama, MD

## 2018-09-07 ENCOUNTER — Encounter: Payer: Self-pay | Admitting: Family Medicine

## 2018-09-07 DIAGNOSIS — Z7189 Other specified counseling: Secondary | ICD-10-CM

## 2018-09-07 DIAGNOSIS — E785 Hyperlipidemia, unspecified: Secondary | ICD-10-CM | POA: Insufficient documentation

## 2018-09-07 HISTORY — DX: Other specified counseling: Z71.89

## 2018-09-07 NOTE — Assessment & Plan Note (Signed)
Improved, she is applauded on this Patient re-educated about  the importance of commitment to a  minimum of 150 minutes of exercise per week as able.  The importance of healthy food choices with portion control discussed, as well as eating regularly and within a 12 hour window most days. The need to choose "clean , green" food 50 to 75% of the time is discussed, as well as to make water the primary drink and set a goal of 64 ounces water daily.  Encouraged to start a food diary,  and to consider  joining a support group. Sample diet sheets offered. Goals set by the patient for the next several months.   Weight /BMI 09/01/2018 05/01/2018 01/02/2018  WEIGHT 306 lb 319 lb 316 lb  HEIGHT 5\' 2"  5\' 2"  5\' 2"   BMI 55.97 kg/m2 58.35 kg/m2 57.8 kg/m2

## 2018-09-07 NOTE — Assessment & Plan Note (Signed)
Improved, continue supplement

## 2018-09-07 NOTE — Assessment & Plan Note (Signed)
[name] is reminded of the importance of commitment to daily physical activity for 30 minutes or more, as able and the need to limit carbohydrate intake to 30 to 60 grams per meal to help with blood sugar control.   The need to take medication as prescribed, test blood sugar as directed, and to call between visits if there is a concern that blood sugar is uncontrolled is also discussed.   [name] is reminded of the importance of daily foot exam, annual eye examination, and good blood sugar, blood pressure and cholesterol control. Add glipizide daily  Diabetic Labs Latest Ref Rng & Units 08/25/2018 04/30/2018 01/03/2018 11/01/2017 10/21/2017  HbA1c <5.7 % of total Hgb 7.4(H) 5.9(H) - 7.5(H) -  Microalbumin Not Estab. ug/mL - - 6.9(H) - -  Micro/Creat Ratio 0.0 - 30.0 mg/g creat - - 3.6 - -  Chol <200 mg/dL 155 - - 165 -  HDL > OR = 50 mg/dL 50 - - 62 -  Calc LDL mg/dL (calc) 91 - - 96 -  Triglycerides <150 mg/dL 46 - - 37 -  Creatinine 0.50 - 1.10 mg/dL 0.61 0.60 - - 0.63   BP/Weight 09/01/2018 05/01/2018 01/02/2018 10/21/2017 12/04/2016 08/15/2016 4/43/1540  Systolic BP 086 761 950 932 671 245 -  Diastolic BP 64 64 74 70 58 78 -  Wt. (Lbs) 306 319 316 316 286.75 280.12 284.6  BMI 55.97 58.35 57.8 57.8 52.45 51.23 52.05   Foot/eye exam completion dates Latest Ref Rng & Units 01/02/2018 12/04/2016  Eye Exam No Retinopathy - -  Foot Form Completion - Done Done

## 2018-09-07 NOTE — Assessment & Plan Note (Signed)
Increased during Spring, as expected, needs to take medication daily, as prescribed

## 2018-09-07 NOTE — Assessment & Plan Note (Signed)
Hyperlipidemia:Low fat diet discussed and encouraged.   Lipid Panel  Lab Results  Component Value Date   CHOL 155 08/25/2018   HDL 50 08/25/2018   LDLCALC 91 08/25/2018   TRIG 46 08/25/2018   CHOLHDL 3.1 08/25/2018  Controlled, no change in medication

## 2018-09-07 NOTE — Assessment & Plan Note (Signed)
Covid-19 Education  The signs and symptoms of of COVID -19 were discussed with the patient and how to seek care for testing. ( follow up with PCP or arrange  E-visit) The importance of social  distancing is discussed today.  

## 2018-09-17 ENCOUNTER — Other Ambulatory Visit: Payer: Self-pay | Admitting: Women's Health

## 2018-09-30 ENCOUNTER — Encounter: Payer: Self-pay | Admitting: Family Medicine

## 2018-09-30 ENCOUNTER — Encounter: Payer: Self-pay | Admitting: *Deleted

## 2018-10-01 ENCOUNTER — Other Ambulatory Visit (HOSPITAL_COMMUNITY)
Admission: RE | Admit: 2018-10-01 | Discharge: 2018-10-01 | Disposition: A | Discharge status: Home / Self Care | Payer: 59 | Source: Ambulatory Visit | Attending: Obstetrics and Gynecology | Admitting: Obstetrics and Gynecology

## 2018-10-01 ENCOUNTER — Other Ambulatory Visit: Payer: Self-pay

## 2018-10-01 ENCOUNTER — Encounter: Payer: Self-pay | Admitting: Women's Health

## 2018-10-01 ENCOUNTER — Ambulatory Visit (INDEPENDENT_AMBULATORY_CARE_PROVIDER_SITE_OTHER): Payer: 59 | Admitting: Women's Health

## 2018-10-01 VITALS — BP 109/73 | HR 61 | Ht 62.5 in | Wt 317.0 lb

## 2018-10-01 DIAGNOSIS — Z01419 Encounter for gynecological examination (general) (routine) without abnormal findings: Secondary | ICD-10-CM | POA: Diagnosis not present

## 2018-10-01 NOTE — Progress Notes (Signed)
   WELL-WOMAN EXAMINATION Patient name: [name] MRN [id number]  Date of birth: 1976-03-07 Chief Complaint:   Gynecologic Exam  History of Present Illness:   [name] is a 43 y.o. G11P2 African American female being seen today for a routine well-woman exam.  Current complaints: none, needs to have IUD switched out  PCP: Dr. Moshe Cipro      does not desire labs, done by PCP Patient's last menstrual period was 07/15/2018. The current method of family planning is IUD, Mirena placed 07/01/13 Last pap 06/24/13. Results were: normal Last mammogram: 10/30/17. Results were: normal. Family h/o breast cancer: No Last colonoscopy: never. Results were: n/a. Family h/o colorectal cancer: No Review of Systems:   Pertinent items are noted in HPI Denies any headaches, blurred vision, fatigue, shortness of breath, chest pain, abdominal pain, abnormal vaginal discharge/itching/odor/irritation, problems with periods, bowel movements, urination, or intercourse unless otherwise stated above. Pertinent History Reviewed:  Reviewed past medical,surgical, social and family history.  Reviewed problem list, medications and allergies. Physical Assessment:   Vitals:   10/01/18 0914  BP: 109/73  Pulse: 61  Weight: (!) 317 lb (143.8 kg)  Height: 5' 2.5" (1.588 m)  Body mass index is 57.06 kg/m.        Physical Examination:   General appearance - well appearing, and in no distress  Mental status - alert, oriented to person, place, and time  Psych:  She has a normal mood and affect  Skin - warm and dry, normal color, no suspicious lesions noted  Chest - effort normal, all lung fields clear to auscultation bilaterally  Heart - normal rate and regular rhythm  Neck:  midline trachea, no thyromegaly or nodules  Breasts - breasts appear normal, no suspicious masses, no skin or nipple changes or  axillary nodes  Abdomen - soft, nontender, nondistended, no masses or organomegaly  Pelvic - VULVA: normal  appearing vulva with no masses, tenderness or lesions  VAGINA: normal appearing vagina with normal color and discharge, no lesions  CERVIX: normal appearing cervix without discharge or lesions, no CMT  Thin prep pap is done w/ HR HPV cotesting  UTERUS: nontender, exam limited by body habitus ADNEXA: nontender, exam limited by body habitus    Extremities:  No swelling or varicosities noted  No results found for this or any previous visit (from the past 24 hour(s)).  Assessment & Plan:  1) Well-Woman Exam  2) IUD 50mths past due for removal> schedule removal/reinsert  Labs/procedures today: pap  Mammogram in July, or sooner if problems Colonoscopy @ 45-50yo, or sooner if problems  No orders of the defined types were placed in this encounter.   Meds: No orders of the defined types were placed in this encounter.   Follow-up: Return for 1st available IUD removal/reinsertion.  [place], [facility] 10/01/2018 9:40 AM

## 2018-10-03 LAB — CYTOLOGY - PAP
Diagnosis: NEGATIVE
HPV: NOT DETECTED

## 2018-10-06 ENCOUNTER — Ambulatory Visit (INDEPENDENT_AMBULATORY_CARE_PROVIDER_SITE_OTHER): Payer: 59 | Admitting: Family Medicine

## 2018-10-06 ENCOUNTER — Encounter: Payer: Self-pay | Admitting: *Deleted

## 2018-10-06 ENCOUNTER — Other Ambulatory Visit: Payer: Self-pay

## 2018-10-06 ENCOUNTER — Encounter: Payer: Self-pay | Admitting: Family Medicine

## 2018-10-06 DIAGNOSIS — K219 Gastro-esophageal reflux disease without esophagitis: Secondary | ICD-10-CM | POA: Diagnosis not present

## 2018-10-06 DIAGNOSIS — E1169 Type 2 diabetes mellitus with other specified complication: Secondary | ICD-10-CM

## 2018-10-06 DIAGNOSIS — E669 Obesity, unspecified: Secondary | ICD-10-CM

## 2018-10-06 MED ORDER — PHENTERMINE HCL 37.5 MG PO TABS: 37.5000 mg | ORAL_TABLET | Freq: Every day | ORAL | 0 refills | Status: DC

## 2018-10-06 MED FILL — PHENTERMINE 37.5 MG TABLET: 37.5 | 90 days supply | Qty: 90 | Fill #0

## 2018-10-06 NOTE — Patient Instructions (Signed)
F/U as before, call if you need me before  It is important that you exercise regularly at least 30 minutes 7 times a week. If you develop chest pain, have severe difficulty breathing, or feel very tired, stop exercising immediately and seek medical attention   Think about what you will eat, plan ahead. Choose " clean, green, fresh or frozen" over canned, processed or packaged foods which are more sugary, salty and fatty. 70 to 75% of food eaten should be vegetables and fruit. Three meals at set times with snacks allowed between meals, but they must be fruit or vegetables. Aim to eat over a 12 hour period , example 7 am to 7 pm, and STOP after  your last meal of the day. Drink water,generally about 64 ounces per day, no other drink is as healthy. Fruit juice is best enjoyed in a healthy way, by EATING the fruit.   Weight loss goal of 4 to 8 pounds  per month  Start phenterine one daily, 90 day supply prescribed  6 hours to 8 hours per day of sleep is recommended

## 2018-10-07 ENCOUNTER — Ambulatory Visit (INDEPENDENT_AMBULATORY_CARE_PROVIDER_SITE_OTHER): Payer: 59 | Admitting: Women's Health

## 2018-10-07 ENCOUNTER — Other Ambulatory Visit: Payer: Self-pay

## 2018-10-07 ENCOUNTER — Encounter: Payer: Self-pay | Admitting: Women's Health

## 2018-10-07 VITALS — BP 94/65 | HR 67 | Ht 62.2 in | Wt <= 1120 oz

## 2018-10-07 DIAGNOSIS — Z3202 Encounter for pregnancy test, result negative: Secondary | ICD-10-CM

## 2018-10-07 DIAGNOSIS — Z30430 Encounter for insertion of intrauterine contraceptive device: Secondary | ICD-10-CM

## 2018-10-07 DIAGNOSIS — Z30432 Encounter for removal of intrauterine contraceptive device: Secondary | ICD-10-CM | POA: Diagnosis not present

## 2018-10-07 LAB — POCT URINE PREGNANCY: Preg Test, Ur: NEGATIVE

## 2018-10-07 MED ORDER — LEVONORGESTREL 19.5 MCG/DAY IU IUD
INTRAUTERINE_SYSTEM | Freq: Once | INTRAUTERINE | Status: AC
  Administered 2018-10-07: 17:00:00 via INTRAUTERINE

## 2018-10-07 NOTE — Addendum Note (Signed)
Addended by: Diona Fanti A on: 10/07/2018 05:02 PM   Modules accepted: Orders

## 2018-10-07 NOTE — Progress Notes (Signed)
   IUD REMOVAL & RE-INSERTION Patient name: [name] MRN [id number]  Date of birth: @DOB  Subjective Findings:   @Jackie  H Lloyd is a 43 y.o. G20P2 African American female being seen today for removal of a Mirena IUD and insertion of a Liletta IUD. Her IUD was placed March 2015.   Patient's last menstrual period was 09/15/2018. Last pap6/10/20. Results were:  normal  The risks and benefits of the method and placement have been thouroughly reviewed with the patient and all questions were answered.  Specifically the patient is aware of failure rate of 04/998, expulsion of the IUD and of possible perforation.  The patient is aware of irregular bleeding due to the method and understands the incidence of irregular bleeding diminishes with time.  Signed copy of informed consent in chart.  Pertinent History Reviewed:   Reviewed past medical,surgical, social, obstetrical and family history.  Reviewed problem list, medications and allergies. Objective Findings & Procedure:    Vitals:   10/07/18 1413  BP: 94/65  Pulse: 67  Weight: 31 lb 3.2 oz (14.2 kg)  Height: 5' 2.2" (1.58 m)  Body mass index is 5.67 kg/m.  Results for orders placed or performed in visit on 10/07/18 (from the past 24 hour(s))  POCT urine pregnancy   Collection Time: 10/07/18  2:21 PM  Result Value Ref Range   Preg Test, Ur Negative Negative     Time out was performed.  A graves speculum was placed in the vagina.  The cervix was visualized, prepped using Betadine. The strings were barely visible. They were grasped and the Mirena IUD was easily removed. The cervix was then grasped with a single-tooth tenaculum. The uterus was found to be anteroflexed and it sounded to 9 cm.  Liletta IUD placed per manufacturer's recommendations without complications. The strings were trimmed to approximately 3 cm.  The patient tolerated the procedure well.   Informal transvaginal sonogram was performed and the proper placement of the  IUD was verified.  Assessment & Plan:   1) Mirena IUD removal & Liletta insertion The patient was given post procedure instructions, including signs and symptoms of infection and to check for the strings after each menses or each month, and refraining from intercourse or anything in the vagina for 3 days. She was given a Liletta care card with date IUD placed, and date IUD to be removed. She is scheduled for a f/u appointment in 4 weeks.  Orders Placed This Encounter  Procedures  . POCT urine pregnancy    Follow-up: Return in about 4 weeks (around 11/04/2018) for F/U webex.  [place], [facility] 10/07/2018 2:42 PM

## 2018-10-07 NOTE — Patient Instructions (Signed)
 Nothing in vagina for 3 days (no sex, douching, tampons, etc...)  Check your strings once a month to make sure you can feel them, if you are not able to please let us know  If you develop a fever of 100.4 or more in the next few weeks, or if you develop severe abdominal pain, please let us know  Use a backup method of birth control, such as condoms, for 2 weeks     Intrauterine Device Insertion, Care After  This sheet gives you information about how to care for yourself after your procedure. Your health care provider may also give you more specific instructions. If you have problems or questions, contact your health care provider. What can I expect after the procedure? After the procedure, it is common to have:  Cramps and pain in the abdomen.  Light bleeding (spotting) or heavier bleeding that is like your menstrual period. This may last for up to a few days.  Lower back pain.  Dizziness.  Headaches.  Nausea. Follow these instructions at home:  Before resuming sexual activity, check to make sure that you can feel the IUD string(s). You should be able to feel the end of the string(s) below the opening of your cervix. If your IUD string is in place, you may resume sexual activity. ? If you had a hormonal IUD inserted more than 7 days after your most recent period started, you will need to use a backup method of birth control for 7 days after IUD insertion. Ask your health care provider whether this applies to you.  Continue to check that the IUD is still in place by feeling for the string(s) after every menstrual period, or once a month.  Take over-the-counter and prescription medicines only as told by your health care provider.  Do not drive or use heavy machinery while taking prescription pain medicine.  Keep all follow-up visits as told by your health care provider. This is important. Contact a health care provider if:  You have bleeding that is heavier or lasts longer  than a normal menstrual cycle.  You have a fever.  You have cramps or abdominal pain that get worse or do not get better with medicine.  You develop abdominal pain that is new or is not in the same area of earlier cramping and pain.  You feel lightheaded or weak.  You have abnormal or bad-smelling discharge from your vagina.  You have pain during sexual activity.  You have any of the following problems with your IUD string(s): ? The string bothers or hurts you or your sexual partner. ? You cannot feel the string. ? The string has gotten longer.  You can feel the IUD in your vagina.  You think you may be pregnant, or you miss your menstrual period.  You think you may have an STI (sexually transmitted infection). Get help right away if:  You have flu-like symptoms.  You have a fever and chills.  You can feel that your IUD has slipped out of place. Summary  After the procedure, it is common to have cramps and pain in the abdomen. It is also common to have light bleeding (spotting) or heavier bleeding that is like your menstrual period.  Continue to check that the IUD is still in place by feeling for the string(s) after every menstrual period, or once a month.  Keep all follow-up visits as told by your health care provider. This is important.  Contact your health care provider   you have problems with your IUD string(s), such as the string getting longer or bothering you or your sexual partner. This information is not intended to replace advice given to you by your health care provider. Make sure you discuss any questions you have with your health care provider. Document Released: 12/06/2010 Document Revised: 02/29/2016 Document Reviewed: 02/29/2016 Elsevier Interactive Patient Education  2019 [employer].  Levonorgestrel intrauterine device (IUD) What is this medicine? LEVONORGESTREL IUD (LEE voe nor jes trel) is a contraceptive (birth control) device. The device is placed  inside the uterus by a healthcare professional. It is used to prevent pregnancy. This device can also be used to treat heavy bleeding that occurs during your period. This medicine may be used for other purposes; ask your health care provider or pharmacist if you have questions. COMMON BRAND NAME(S): Minette Headland What should I tell my health care provider before I take this medicine? They need to know if you have any of these conditions: -abnormal Pap smear -cancer of the breast, uterus, or cervix -diabetes -endometritis -genital or pelvic infection now or in the past -have more than one sexual partner or your partner has more than one partner -heart disease -history of an ectopic or tubal pregnancy -immune system problems -IUD in place -liver disease or tumor -problems with blood clots or take blood-thinners -seizures -use intravenous drugs -uterus of unusual shape -vaginal bleeding that has not been explained -an unusual or allergic reaction to levonorgestrel, other hormones, silicone, or polyethylene, medicines, foods, dyes, or preservatives -pregnant or trying to get pregnant -breast-feeding How should I use this medicine? This device is placed inside the uterus by a health care professional. Talk to your pediatrician regarding the use of this medicine in children. Special care may be needed. Overdosage: If you think you have taken too much of this medicine contact a poison control center or emergency room at once. NOTE: This medicine is only for you. Do not share this medicine with others. What if I miss a dose? This does not apply. Depending on the brand of device you have inserted, the device will need to be replaced every 3 to 5 years if you wish to continue using this type of birth control. What may interact with this medicine? Do not take this medicine with any of the following medications: -amprenavir -bosentan -fosamprenavir This medicine may also  interact with the following medications: -aprepitant -armodafinil -barbiturate medicines for inducing sleep or treating seizures -bexarotene -boceprevir -griseofulvin -medicines to treat seizures like carbamazepine, ethotoin, felbamate, oxcarbazepine, phenytoin, topiramate -modafinil -pioglitazone -rifabutin -rifampin -rifapentine -some medicines to treat HIV infection like atazanavir, efavirenz, indinavir, lopinavir, nelfinavir, tipranavir, ritonavir -St. John's wort -warfarin This list may not describe all possible interactions. Give your health care provider a list of all the medicines, herbs, non-prescription drugs, or dietary supplements you use. Also tell them if you smoke, drink alcohol, or use illegal drugs. Some items may interact with your medicine. What should I watch for while using this medicine? Visit your doctor or health care professional for regular check ups. See your doctor if you or your partner has sexual contact with others, becomes HIV positive, or gets a sexual transmitted disease. This product does not protect you against HIV infection (AIDS) or other sexually transmitted diseases. You can check the placement of the IUD yourself by reaching up to the top of your vagina with clean fingers to feel the threads. Do not pull on the threads. It is a good habit  to check placement after each menstrual period. Call your doctor right away if you feel more of the IUD than just the threads or if you cannot feel the threads at all. The IUD may come out by itself. You may become pregnant if the device comes out. If you notice that the IUD has come out use a backup birth control method like condoms and call your health care provider. Using tampons will not change the position of the IUD and are okay to use during your period. This IUD can be safely scanned with magnetic resonance imaging (MRI) only under specific conditions. Before you have an MRI, tell your healthcare provider that  you have an IUD in place, and which type of IUD you have in place. What side effects may I notice from receiving this medicine? Side effects that you should report to your doctor or health care professional as soon as possible: -allergic reactions like skin rash, itching or hives, swelling of the face, lips, or tongue -fever, flu-like symptoms -genital sores -high blood pressure -no menstrual period for 6 weeks during use -pain, swelling, warmth in the leg -pelvic pain or tenderness -severe or sudden headache -signs of pregnancy -stomach cramping -sudden shortness of breath -trouble with balance, talking, or walking -unusual vaginal bleeding, discharge -yellowing of the eyes or skin Side effects that usually do not require medical attention (report to your doctor or health care professional if they continue or are bothersome): -acne -breast pain -change in sex drive or performance -changes in weight -cramping, dizziness, or faintness while the device is being inserted -headache -irregular menstrual bleeding within first 3 to 6 months of use -nausea This list may not describe all possible side effects. Call your doctor for medical advice about side effects. You may report side effects to FDA at 1-800-FDA-1088. Where should I keep my medicine? This does not apply. NOTE: This sheet is a summary. It may not cover all possible information. If you have questions about this medicine, talk to your doctor, pharmacist, or health care provider.  2019 Elsevier/Gold Standard (2016-01-20 14:14:56)

## 2018-10-11 ENCOUNTER — Encounter: Payer: Self-pay | Admitting: Family Medicine

## 2018-10-11 NOTE — Assessment & Plan Note (Signed)
Controlled, no change in medication  

## 2018-10-11 NOTE — Progress Notes (Signed)
LOULA MARCELLA     MRN: [id number]      DOB: 1975/10/26   HPI [name] is here for follow up and to start prescription medication for obesity management. States she will work on daily exercise and also change food choice as she has gradually been regaining weight loss with bariatric surgery There are no specific complaints  Has used phentermine in the past with fair success and no adverse s/e  ROS Denies recent fever or chills. Denies sinus pressure, nasal congestion, ear pain or sore throat. Denies chest congestion, productive cough or wheezing. Denies chest pains, palpitations and leg swelling Denies abdominal pain, nausea, vomiting,diarrhea or constipation.   Denies dysuria, frequency, hesitancy or incontinence. Denies joint pain, swelling and limitation in mobility. Denies headaches, seizures, numbness, or tingling. Denies depression, anxiety or insomnia. Denies skin break down or rash.   PE  BP 108/64    Pulse 62    Resp 12    Ht 5' 2.5" (1.588 m)    Wt (!) 317 lb 1.9 oz (143.8 kg)    LMP 09/15/2018    SpO2 99%    BMI 57.08 kg/m   Patient alert and oriented and in no cardiopulmonary distress.  HEENT: No facial asymmetry, EOMI,   oropharynx pink and moist.  Neck supple no JVD, no mass.  Chest: Clear to auscultation bilaterally.  CVS: S1, S2 no murmurs, no S3.Regular rate.  ABD: Soft non tender.   Ext: No edema  MS: Adequate ROM spine, shoulders, hips and knees.  Skin: Intact, no ulcerations or rash noted.  Psych: Good eye contact, normal affect. Memory intact not anxious or depressed appearing.  CNS: CN 2-12 intact, power,  normal throughout.no focal deficits noted.   Assessment & Plan  Morbid obesity [facility])  Patient re-educated about  the importance of commitment to a  minimum of 150 minutes of exercise per week as able.  The importance of healthy food choices with portion control discussed, as well as eating regularly and within a 12 hour window most  days. The need to choose "clean , green" food 50 to 75% of the time is discussed, as well as to make water the primary drink and set a goal of 64 ounces water daily.    Weight /BMI 10/07/2018 10/06/2018 10/01/2018  WEIGHT 31 lb 3.2 oz 317 lb 1.9 oz 317 lb  HEIGHT 5' 2.2" 5' 2.5" 5' 2.5"  BMI 5.67 kg/m2 57.08 kg/m2 57.06 kg/m2    Start half phentermine daily  Diabetes mellitus type 2 in obese Controlled, no change in medication Ms. Guttierrez is reminded of the importance of commitment to daily physical activity for 30 minutes or more, as able and the need to limit carbohydrate intake to 30 to 60 grams per meal to help with blood sugar control.   The need to take medication as prescribed, test blood sugar as directed, and to call between visits if there is a concern that blood sugar is uncontrolled is also discussed.   Ms. Baray is reminded of the importance of daily foot exam, annual eye examination, and good blood sugar, blood pressure and cholesterol control.  Diabetic Labs Latest Ref Rng & Units 08/25/2018 04/30/2018 01/03/2018 11/01/2017 10/21/2017  HbA1c <5.7 % of total Hgb 7.4(H) 5.9(H) - 7.5(H) -  Microalbumin Not Estab. ug/mL - - 6.9(H) - -  Micro/Creat Ratio 0.0 - 30.0 mg/g creat - - 3.6 - -  Chol <200 mg/dL 155 - - 165 -  HDL > OR =  50 mg/dL 50 - - 62 -  Calc LDL mg/dL (calc) 91 - - 96 -  Triglycerides <150 mg/dL 46 - - 37 -  Creatinine 0.50 - 1.10 mg/dL 0.61 0.60 - - 0.63   BP/Weight 10/07/2018 10/06/2018 10/01/2018 09/01/2018 05/01/2018 10/04/4313 4/0/0867  Systolic BP 94 619 509 326 712 458 099  Diastolic BP 65 64 73 64 64 74 70  Wt. (Lbs) 31.2 317.12 317 306 319 316 316  BMI 5.67 57.08 57.06 55.97 58.35 57.8 57.8   Foot/eye exam completion dates Latest Ref Rng & Units 01/02/2018 12/04/2016  Eye Exam No Retinopathy - -  Foot Form Completion - Done Done        GERD Controlled, no change in medication

## 2018-10-11 NOTE — Assessment & Plan Note (Signed)
  Patient re-educated about  the importance of commitment to a  minimum of 150 minutes of exercise per week as able.  The importance of healthy food choices with portion control discussed, as well as eating regularly and within a 12 hour window most days. The need to choose "clean , green" food 50 to 75% of the time is discussed, as well as to make water the primary drink and set a goal of 64 ounces water daily.    Weight /BMI 10/07/2018 10/06/2018 10/01/2018  WEIGHT 31 lb 3.2 oz 317 lb 1.9 oz 317 lb  HEIGHT 5' 2.2" 5' 2.5" 5' 2.5"  BMI 5.67 kg/m2 57.08 kg/m2 57.06 kg/m2    Start half phentermine daily

## 2018-10-11 NOTE — Assessment & Plan Note (Signed)
Controlled, no change in medication [name] is reminded of the importance of commitment to daily physical activity for 30 minutes or more, as able and the need to limit carbohydrate intake to 30 to 60 grams per meal to help with blood sugar control.   The need to take medication as prescribed, test blood sugar as directed, and to call between visits if there is a concern that blood sugar is uncontrolled is also discussed.   [name] is reminded of the importance of daily foot exam, annual eye examination, and good blood sugar, blood pressure and cholesterol control.  Diabetic Labs Latest Ref Rng & Units 08/25/2018 04/30/2018 01/03/2018 11/01/2017 10/21/2017  HbA1c <5.7 % of total Hgb 7.4(H) 5.9(H) - 7.5(H) -  Microalbumin Not Estab. ug/mL - - 6.9(H) - -  Micro/Creat Ratio 0.0 - 30.0 mg/g creat - - 3.6 - -  Chol <200 mg/dL 155 - - 165 -  HDL > OR = 50 mg/dL 50 - - 62 -  Calc LDL mg/dL (calc) 91 - - 96 -  Triglycerides <150 mg/dL 46 - - 37 -  Creatinine 0.50 - 1.10 mg/dL 0.61 0.60 - - 0.63   BP/Weight 10/07/2018 10/06/2018 10/01/2018 09/01/2018 05/01/2018 3/54/6568 04/25/7515  Systolic BP 94 001 749 449 675 916 384  Diastolic BP 65 64 73 64 64 74 70  Wt. (Lbs) 31.2 317.12 317 306 319 316 316  BMI 5.67 57.08 57.06 55.97 58.35 57.8 57.8   Foot/eye exam completion dates Latest Ref Rng & Units 01/02/2018 12/04/2016  Eye Exam No Retinopathy - -  Foot Form Completion - Done Done

## 2018-10-27 ENCOUNTER — Other Ambulatory Visit: Payer: Self-pay | Admitting: Family Medicine

## 2018-10-27 MED FILL — PANTOPRAZOLE SOD DR 40 MG T: 40 | 90 days supply | Qty: 90 | Fill #0

## 2018-11-04 ENCOUNTER — Encounter: Payer: Self-pay | Admitting: Women's Health

## 2018-11-04 ENCOUNTER — Other Ambulatory Visit: Payer: Self-pay

## 2018-11-04 ENCOUNTER — Ambulatory Visit (INDEPENDENT_AMBULATORY_CARE_PROVIDER_SITE_OTHER): Payer: 59 | Admitting: Women's Health

## 2018-11-04 ENCOUNTER — Encounter: Payer: Self-pay | Admitting: Family Medicine

## 2018-11-04 VITALS — Ht 62.0 in | Wt 310.0 lb

## 2018-11-04 DIAGNOSIS — Z30431 Encounter for routine checking of intrauterine contraceptive device: Secondary | ICD-10-CM

## 2018-11-04 NOTE — Progress Notes (Signed)
   [place] VIRTUAL GYN VISIT ENCOUNTER NOTE Patient name: [name] MRN [id number]  Date of birth: 1976-02-05  I connected with patient on 11/04/18 at  3:30 PM EDT by [facility] and verified that I am speaking with the correct person using two identifiers.  Due to COVID-19 recommendations, pt is not currently in the office.    I discussed the limitations, risks, security and privacy concerns of performing an evaluation and management service by telephone and the availability of in person appointments. I also discussed with the patient that there may be a patient responsible charge related to this service. The patient expressed understanding and agreed to proceed.   Chief Complaint:   Follow-up (iud)  History of Present Illness:   [name] is a 43 y.o. G2P2 African American female being evaluated today for f/u on IUD. Mirena removed & Liletta placed 10/07/18. Doing well, had normal period, no pain, hasn't tried to feel for strings. Some hot flashes. Mom was 43yo when she went through menopause.      Patient's last menstrual period was 10/14/2018. The current method of family planning is IUD. Last pap 10/01/18. Results were:  normal Review of Systems:   Pertinent items are noted in HPI Denies fever/chills, dizziness, headaches, visual disturbances, fatigue, shortness of breath, chest pain, abdominal pain, vomiting, abnormal vaginal discharge/itching/odor/irritation, problems with periods, bowel movements, urination, or intercourse unless otherwise stated above.  Pertinent History Reviewed:  Reviewed past medical,surgical, social, obstetrical and family history.  Reviewed problem list, medications and allergies. Physical Assessment:   Vitals:   11/04/18 1518  Weight: (!) 310 lb (140.6 kg)  Height: 5\' 2"  (1.575 m)  Body mass index is 56.7 kg/m.       Physical Examination:   General:  Alert, oriented and cooperative.   Mental Status: Normal mood and affect perceived. Normal  judgment and thought content.  Physical exam deferred due to nature of the encounter  No results found for this or any previous visit (from the past 24 hour(s)).  Assessment & Plan:  1) IUD check> doing well, no complaints  Meds: No orders of the defined types were placed in this encounter.   No orders of the defined types were placed in this encounter.   I discussed the assessment and treatment plan with the patient. The patient was provided an opportunity to ask questions and all were answered. The patient agreed with the plan and demonstrated an understanding of the instructions.   The patient was advised to call back or seek an in-person evaluation/go to the ED if the symptoms worsen or if the condition fails to improve as anticipated.  I provided 10 minutes of non-face-to-face time during this encounter.   Return in about 1 year (around 11/04/2019) for Physical.  [place], [facility] 11/04/2018 3:28 PM

## 2018-11-20 ENCOUNTER — Other Ambulatory Visit: Payer: Self-pay | Admitting: Family Medicine

## 2018-11-20 MED FILL — metFORMIN HCL 1000 MG TABS: 1000 | 90 days supply | Qty: 180 | Fill #0

## 2018-11-20 MED FILL — glipiZIDE XL 5 MG TB24: 5 | 90 days supply | Qty: 90 | Fill #0

## 2018-11-21 MED FILL — ACCU-CHEK FASTCLIX LANCETS: 90 days supply | Qty: 102 | Fill #1

## 2018-12-01 MED FILL — ACCU-CHEK GUIDE STRP: 90 days supply | Qty: 100 | Fill #1

## 2019-01-13 DIAGNOSIS — E1169 Type 2 diabetes mellitus with other specified complication: Secondary | ICD-10-CM | POA: Diagnosis not present

## 2019-01-13 DIAGNOSIS — E669 Obesity, unspecified: Secondary | ICD-10-CM | POA: Diagnosis not present

## 2019-01-14 ENCOUNTER — Encounter: Payer: Self-pay | Admitting: Family Medicine

## 2019-01-14 LAB — MICROALBUMIN, URINE: Microalb, Ur: 1.9 mg/dL

## 2019-01-14 LAB — COMPLETE METABOLIC PANEL WITHOUT GFR
AG Ratio: 1.4 (calc) (ref 1.0–2.5)
ALT: 7 U/L (ref 6–29)
AST: 15 U/L (ref 10–35)
Albumin: 4.1 g/dL (ref 3.6–5.1)
Alkaline phosphatase (APISO): 52 U/L (ref 31–125)
BUN: 18 mg/dL (ref 7–25)
CO2: 22 mmol/L (ref 20–32)
Calcium: 9.1 mg/dL (ref 8.6–10.2)
Chloride: 106 mmol/L (ref 98–110)
Creat: 0.63 mg/dL (ref 0.50–1.10)
GFR, Est African American: 126 mL/min/1.73m2
GFR, Est Non African American: 108 mL/min/1.73m2
Globulin: 2.9 g/dL (ref 1.9–3.7)
Glucose, Bld: 105 mg/dL — ABNORMAL HIGH (ref 65–99)
Potassium: 4.1 mmol/L (ref 3.5–5.3)
Sodium: 140 mmol/L (ref 135–146)
Total Bilirubin: 0.4 mg/dL (ref 0.2–1.2)
Total Protein: 7 g/dL (ref 6.1–8.1)

## 2019-01-14 LAB — HEMOGLOBIN A1C
Hgb A1c MFr Bld: 6 %{Hb} — ABNORMAL HIGH
Mean Plasma Glucose: 126 (calc)
eAG (mmol/L): 7 (calc)

## 2019-01-15 ENCOUNTER — Other Ambulatory Visit: Payer: Self-pay

## 2019-01-15 ENCOUNTER — Encounter (HOSPITAL_COMMUNITY): Payer: Self-pay

## 2019-01-15 ENCOUNTER — Ambulatory Visit (HOSPITAL_COMMUNITY)
Admission: RE | Admit: 2019-01-15 | Discharge: 2019-01-15 | Disposition: A | Discharge status: Home / Self Care | Payer: 59 | Source: Ambulatory Visit | Attending: Family Medicine | Admitting: Family Medicine

## 2019-01-15 DIAGNOSIS — Z1231 Encounter for screening mammogram for malignant neoplasm of breast: Secondary | ICD-10-CM | POA: Insufficient documentation

## 2019-01-19 ENCOUNTER — Ambulatory Visit: Payer: 59 | Admitting: Family Medicine

## 2019-01-19 ENCOUNTER — Encounter: Payer: Self-pay | Admitting: Family Medicine

## 2019-01-19 ENCOUNTER — Other Ambulatory Visit: Payer: Self-pay

## 2019-01-19 VITALS — BP 110/70 | HR 61 | Temp 98.4°F | Resp 15 | Ht 62.0 in | Wt 305.0 lb

## 2019-01-19 DIAGNOSIS — E1169 Type 2 diabetes mellitus with other specified complication: Secondary | ICD-10-CM

## 2019-01-19 DIAGNOSIS — J309 Allergic rhinitis, unspecified: Secondary | ICD-10-CM | POA: Diagnosis not present

## 2019-01-19 DIAGNOSIS — E669 Obesity, unspecified: Secondary | ICD-10-CM | POA: Diagnosis not present

## 2019-01-19 DIAGNOSIS — E559 Vitamin D deficiency, unspecified: Secondary | ICD-10-CM

## 2019-01-19 DIAGNOSIS — Z1211 Encounter for screening for malignant neoplasm of colon: Secondary | ICD-10-CM | POA: Diagnosis not present

## 2019-01-19 DIAGNOSIS — D509 Iron deficiency anemia, unspecified: Secondary | ICD-10-CM | POA: Diagnosis not present

## 2019-01-19 DIAGNOSIS — E785 Hyperlipidemia, unspecified: Secondary | ICD-10-CM

## 2019-01-19 MED ORDER — PHENTERMINE HCL 37.5 MG PO TABS: 37.5000 mg | ORAL_TABLET | Freq: Every day | ORAL | 1 refills | Status: DC

## 2019-01-19 MED FILL — PHENTERMINE 37.5 MG TABLET: 37.5 | 60 days supply | Qty: 60 | Fill #0

## 2019-01-19 NOTE — Assessment & Plan Note (Signed)
Controlled, no change in medication  

## 2019-01-19 NOTE — Assessment & Plan Note (Signed)
[name] is reminded of the importance of commitment to daily physical activity for 30 minutes or more, as able and the need to limit carbohydrate intake to 30 to 60 grams per meal to help with blood sugar control.   The need to take medication as prescribed, test blood sugar as directed, and to call between visits if there is a concern that blood sugar is uncontrolled is also discussed.   [name] is reminded of the importance of daily foot exam, annual eye examination, and good blood sugar, blood pressure and cholesterol control.  Diabetic Labs Latest Ref Rng & Units 01/13/2019 08/25/2018 04/30/2018 01/03/2018 11/01/2017  HbA1c <5.7 % of total Hgb 6.0(H) 7.4(H) 5.9(H) - 7.5(H)  Microalbumin mg/dL 1.9 - - 6.9(H) -  Micro/Creat Ratio 0.0 - 30.0 mg/g creat - - - 3.6 -  Chol <200 mg/dL - 155 - - 165  HDL > OR = 50 mg/dL - 50 - - 62  Calc LDL mg/dL (calc) - 91 - - 96  Triglycerides <150 mg/dL - 46 - - 37  Creatinine 0.50 - 1.10 mg/dL 0.63 0.61 0.60 - -   BP/Weight 01/19/2019 11/04/2018 10/07/2018 10/06/2018 10/01/2018 [id number]  Systolic BP A999333 - 94 123XX123 0000000 A999333 A999333  Diastolic BP 70 - 65 64 73 64 64  Wt. (Lbs) 305 310 31.2 317.12 317 306 319  BMI 55.79 56.7 5.67 57.08 57.06 55.97 58.35   Foot/eye exam completion dates Latest Ref Rng & Units 01/19/2019 06/09/2018  Eye Exam No Retinopathy - No Retinopathy  Foot Form Completion - Done -      Improved, no change in mx

## 2019-01-19 NOTE — Progress Notes (Signed)
[name]     MRN: [id number]      DOB: [birth year]   HPI [name] is here for follow up and re-evaluation of chronic medical conditions, medication management and review of any available recent lab and radiology data.  Preventive health is updated, specifically  Cancer screening and Immunization.   Questions or concerns regarding consultations or procedures which the PT has had in the interim are  addressed. The PT denies any adverse reactions to current medications since the last visit.  There are no new concerns.  There are no specific complaints   ROS Denies recent fever or chills. Denies sinus pressure, nasal congestion, ear pain or sore throat. Denies chest congestion, productive cough or wheezing. Denies chest pains, palpitations and leg swelling Denies abdominal pain, nausea, vomiting,diarrhea or constipation.   Denies dysuria, frequency, hesitancy or incontinence. Denies joint pain, swelling and limitation in mobility. Denies headaches, seizures, numbness, or tingling. Denies depression, anxiety or insomnia. Denies skin break down or rash.   PE  BP 110/70   Pulse 61   Temp 98.4 F (36.9 C) (Temporal)   Resp 15   Ht 5\' 2"  (1.575 m)   Wt (!) 305 lb (138.3 kg)   SpO2 96%   BMI 55.79 kg/m    Patient alert and oriented and in no cardiopulmonary distress.  HEENT: No facial asymmetry, EOMI,   oropharynx pink and moist.  Neck supple no JVD, no mass.  Chest: Clear to auscultation bilaterally.  CVS: S1, S2 no murmurs, no S3.Regular rate.  ABD: Soft non tender.   Ext: No edema  MS: Adequate ROM spine, shoulders, hips and knees.  Skin: Intact, no ulcerations or rash noted.  Psych: Good eye contact, normal affect. Memory intact not anxious or depressed appearing.  CNS: CN 2-12 intact, power,  normal throughout.no focal deficits noted.   Assessment & Plan  Morbid obesity [facility])  Patient re-educated about  the importance of commitment to a  minimum of  150 minutes of exercise per week as able.  The importance of healthy food choices with portion control discussed, as well as eating regularly and within a 12 hour window most days. The need to choose "clean , green" food 50 to 75% of the time is discussed, as well as to make water the primary drink and set a goal of 64 ounces water daily. Improved , continue daily phentermine   Weight /BMI 01/19/2019 11/04/2018 10/07/2018  WEIGHT 305 lb 310 lb 31 lb 3.2 oz  HEIGHT 5\' 2"  5\' 2"  5' 2.2"  BMI 55.79 kg/m2 56.7 kg/m2 5.67 kg/m2      Diabetes mellitus type 2 in obese [name] is reminded of the importance of commitment to daily physical activity for 30 minutes or more, as able and the need to limit carbohydrate intake to 30 to 60 grams per meal to help with blood sugar control.   The need to take medication as prescribed, test blood sugar as directed, and to call between visits if there is a concern that blood sugar is uncontrolled is also discussed.   [name] is reminded of the importance of daily foot exam, annual eye examination, and good blood sugar, blood pressure and cholesterol control.  Diabetic Labs Latest Ref Rng & Units 01/13/2019 08/25/2018 04/30/2018 01/03/2018 11/01/2017  HbA1c <5.7 % of total Hgb 6.0(H) 7.4(H) 5.9(H) - 7.5(H)  Microalbumin mg/dL 1.9 - - 6.9(H) -  Micro/Creat Ratio 0.0 - 30.0 mg/g creat - - - 3.6 -  Chol <200 mg/dL - 155 - - 165  HDL > OR = 50 mg/dL - 50 - - 62  Calc LDL mg/dL (calc) - 91 - - 96  Triglycerides <150 mg/dL - 46 - - 37  Creatinine 0.50 - 1.10 mg/dL 0.63 0.61 0.60 - -   BP/Weight 01/19/2019 11/04/2018 10/07/2018 10/06/2018 10/01/2018 [id number]  Systolic BP A999333 - 94 [id number] 0000000 A999333 A999333  Diastolic BP 70 - 65 64 73 64 64  Wt. (Lbs) 305 310 31.2 317.12 317 306 319  BMI 55.79 56.7 5.67 57.08 57.06 55.97 58.35   Foot/eye exam completion dates Latest Ref Rng & Units 01/19/2019 06/09/2018  Eye Exam No Retinopathy - No Retinopathy  Foot Form Completion -  Done -      Improved, no change in mx  Allergic rhinitis Controlled, no change in medication   Dyslipidemia Hyperlipidemia:Low fat diet discussed and encouraged.   Lipid Panel  Lab Results  Component Value Date   CHOL 155 08/25/2018   HDL 50 08/25/2018   LDLCALC 91 08/25/2018   TRIG 46 08/25/2018   CHOLHDL 3.1 08/25/2018  Controlled, no change in medication Updated lab needed at/ before next visit.

## 2019-01-19 NOTE — Assessment & Plan Note (Signed)
Hyperlipidemia:Low fat diet discussed and encouraged.   Lipid Panel  Lab Results  Component Value Date   CHOL 155 08/25/2018   HDL 50 08/25/2018   LDLCALC 91 08/25/2018   TRIG 46 08/25/2018   CHOLHDL 3.1 08/25/2018  Controlled, no change in medication Updated lab needed at/ before next visit.

## 2019-01-19 NOTE — Assessment & Plan Note (Signed)
  Patient re-educated about  the importance of commitment to a  minimum of 150 minutes of exercise per week as able.  The importance of healthy food choices with portion control discussed, as well as eating regularly and within a 12 hour window most days. The need to choose "clean , green" food 50 to 75% of the time is discussed, as well as to make water the primary drink and set a goal of 64 ounces water daily. Improved , continue daily phentermine   Weight /BMI 01/19/2019 11/04/2018 10/07/2018  WEIGHT 305 lb 310 lb 31 lb 3.2 oz  HEIGHT 5\' 2"  5\' 2"  5' 2.2"  BMI 55.79 kg/m2 56.7 kg/m2 5.67 kg/m2

## 2019-01-19 NOTE — Patient Instructions (Addendum)
F/U in 4 months, call if you need me before  CONGRATS , and  Keep up the good work!!  Good foot exam  Start aspirin 81 mg one daily for heart health  You are referred to GI for screening colonoscopy  Fasting lipid, cmp and EGFr, hBA1C , TSH, vit D and cBC 1 week before follow up  Phentermine one daily  Weight  Loss goal of 15 pounds  Think about what you will eat, plan ahead. Choose " clean, green, fresh or frozen" over canned, processed or packaged foods which are more sugary, salty and fatty. 70 to 75% of food eaten should be vegetables and fruit. Three meals at set times with snacks allowed between meals, but they must be fruit or vegetables. Aim to eat over a 12 hour period , example 7 am to 7 pm, and STOP after  your last meal of the day. Drink water,generally about 64 ounces per day, no other drink is as healthy. Fruit juice is best enjoyed in a healthy way, by EATING the fruit.

## 2019-01-20 ENCOUNTER — Encounter: Payer: Self-pay | Admitting: Gastroenterology

## 2019-01-26 ENCOUNTER — Other Ambulatory Visit: Payer: Self-pay | Admitting: Family Medicine

## 2019-01-26 MED FILL — PANTOPRAZOLE SOD DR 40 MG T: 40 | 90 days supply | Qty: 90 | Fill #0

## 2019-01-30 ENCOUNTER — Telehealth: Payer: Self-pay | Admitting: Family Medicine

## 2019-01-30 NOTE — Telephone Encounter (Signed)
Flu Shot today at work

## 2019-01-30 NOTE — Telephone Encounter (Signed)
Patients chart updated.

## 2019-03-02 ENCOUNTER — Other Ambulatory Visit: Payer: Self-pay | Admitting: Family Medicine

## 2019-03-02 MED FILL — glipiZIDE XL 5 MG TB24: 5 | 90 days supply | Qty: 90 | Fill #1

## 2019-03-03 ENCOUNTER — Encounter: Payer: Self-pay | Admitting: Gastroenterology

## 2019-03-03 ENCOUNTER — Other Ambulatory Visit: Payer: Self-pay

## 2019-03-03 ENCOUNTER — Ambulatory Visit (INDEPENDENT_AMBULATORY_CARE_PROVIDER_SITE_OTHER): Payer: 59 | Admitting: Gastroenterology

## 2019-03-03 VITALS — BP 127/82 | HR 64 | Temp 97.0°F | Ht 62.0 in | Wt 298.0 lb

## 2019-03-03 DIAGNOSIS — Z8371 Family history of colonic polyps: Secondary | ICD-10-CM

## 2019-03-03 MED ORDER — NA SULFATE-K SULFATE-MG SULF 17.5-3.13-1.6 GM/177ML PO SOLN: 1.0000 | ORAL | 0 refills | Status: AC

## 2019-03-03 MED FILL — SUPREP BOWEL PREP KIT: 17.5-3.13-1 | 1 days supply | Qty: 354 | Fill #0

## 2019-03-03 MED FILL — metFORMIN HCL 1000 MG TABS: 1000 | 90 days supply | Qty: 180 | Fill #0

## 2019-03-03 NOTE — Patient Instructions (Signed)
You have been scheduled for a colonoscopy. Please follow written instructions given to you at your visit today.  Please pick up your prep supplies at the pharmacy within the next 1-3 days. If you use inhalers (even only as needed), please bring them with you on the day of your procedure.  Tips for colonoscopy:  -STAY WELL HYDRATED FOR 3-4 DAYS PRIOR TO THE EXAM. This reduces nausea and dehydration.  -TO PREVENT SKIN/HEMORRHOID IRRITATION- prior to wiping, put A&Dointment or vaseline on the toilet paper. -Keep a towel or pad on the bed.  -DRINK 64oz of clear liquids in the morning of prep day (PRIOR TO STARTING THE PREP) to be sure that there is enough fluid to flush the colon and stay hydrated!!!! This is in addition to the fluids required for preparation.

## 2019-03-03 NOTE — Progress Notes (Signed)
Referring Provider: Fayrene Helper, MD Primary Care Physician:  Fayrene Helper, MD  Reason for Consultation: Family history of colon polyps   IMPRESSION:  Family history of colon polyps (mother, maternal aunts, all before 79) BMI 54.5 No prior colon cancer screening  Colonoscopy recommended given the patient's age as well as her family history of colon polyps.  PLAN: Colonoscopy at the hospital  The nature of the procedure, as well as the risks, benefits, and alternatives were carefully and thoroughly reviewed with the patient. Ample time for discussion and questions allowed. The patient understood, was satisfied, and agreed to proceed.  Please see the "Patient Instructions" section for addition details about the plan.  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  She is referred by Dr. Moshe Cipro for a family history of colon polyps.  The history is obtained to the patient and review of her electronic health record. She has type 2 diabetes, obesity, allergic rhinitis, dyslipidemia, and a history of reflux. She had a gastric sleeve 2 years ago.  Mom and two maternal aunts with colon polyps before age 74. No other known family history of colon cancer or polyps. No family history of uterine/endometrial cancer, pancreatic cancer or gastric/stomach cancer.  Has history of reflux that developed during her first pregnancy controlled on Protonix 40 mg daily. Will have breakthrough symptoms if she misses a dose or two while waiting for prescription refills.  She is satisfied with her current regimen.  No prior colon cancer screening or colonoscopy.  There is no dysphagia, odynophagia, regurgitation,  nausea, abdominal pain, change in bowel habits, melena, hematochezia, or bright red blood per rectum. There is no anorexia or recent change in weight.   Prior abdominal imaging: Upper GI series with KUB 01/19/2016: Normal Abdominal ultrasound 01/19/2016: Normal     Past Medical History:  Diagnosis Date  . Allergy   . Anemia   . Arthritis   . Diabetes mellitus    type II   . GERD (gastroesophageal reflux disease)   . PONV (postoperative nausea and vomiting)     Past Surgical History:  Procedure Laterality Date  . CARDIAC CATHETERIZATION    . CESAREAN SECTION     x 2  . LAPAROSCOPIC GASTRIC SLEEVE RESECTION N/A 04/02/2016   Procedure: LAPAROSCOPIC GASTRIC SLEEVE RESECTION, UPPER ENDO;  Surgeon: Johnathan Hausen, MD;  Location: [facility];  Service: General;  Laterality: N/A;    Current Outpatient Medications  Medication Sig Dispense Refill  . ACCU-CHEK FASTCLIX LANCETS MISC Use as directed once daily DX e11.9 100 each 5  . aspirin EC 81 MG tablet Take 81 mg by mouth daily.    . blood glucose meter kit and supplies KIT Dispense based on patient and insurance preference. Test once daily. (FOR ICD-9 250.00, 250.01). 1 each 0  . diclofenac sodium (VOLTAREN) 1 % GEL APPLY TO AFFECTED AREA ON KNEE AS NEEDED FOR NEED PAIN 300 g 1  . ergocalciferol (VITAMIN D2) 1.25 MG (50000 UT) capsule Take 1 capsule (50,000 Units total) by mouth once a week. One capsule once weekly 12 capsule 3  . fluticasone (FLONASE) 50 MCG/ACT nasal spray PLACE 2 SPRAYS INTO BOTH NOSTRILS DAILY. (Patient taking differently: Place 2 sprays into both nostrils as needed. ) 48 g 3  . glipiZIDE (GLUCOTROL XL) 5 MG 24 hr tablet Take 1 tablet (5 mg total) by mouth daily with breakfast. 90 tablet 3  . glucose blood test strip Use  as instructed once daily dx e11.9 100 each 5  . levonorgestrel (MIRENA) 20 MCG/24HR IUD 1 each by Intrauterine route once.    . metFORMIN (GLUCOPHAGE) 1000 MG tablet TAKE 1 TABLET BY MOUTH 2 TIMES DAILY WITH A MEAL. 180 tablet 0  . Multiple Vitamin (MULTIVITAMIN) capsule Take 1 capsule by mouth daily.    . pantoprazole (PROTONIX) 40 MG tablet TAKE 1 TABLET BY MOUTH DAILY. 90 tablet 0  . phentermine (ADIPEX-P) 37.5 MG tablet Take 1 tablet (37.5 mg total) by mouth  daily before breakfast. 60 tablet 1  . rosuvastatin (CRESTOR) 5 MG tablet Take one tablet 3 times weekly 36 tablet 3  . iron polysaccharides (NU-IRON) 150 MG capsule Take 1 capsule (150 mg total) by mouth daily. (Patient taking differently: Take 150 mg by mouth every Monday, Wednesday, and Friday. ) 90 capsule 3   No current facility-administered medications for this visit.     Allergies as of 03/03/2019  . (No Known Allergies)    Family History  Problem Relation Age of Onset  . Hypertension Mother   . Colon polyps Mother   . COPD Father   . Diabetes Father   . Heart disease Father   . Hypertension Father   . Kidney disease Father   . Stroke Father   . Diabetes Daughter   . Diabetes Daughter   . Colon polyps Maternal Aunt     Social History   Socioeconomic History  . Marital status: Married    Spouse name: Not on file  . Number of children: 2  . Years of education: Not on file  . Highest education level: Not on file  Occupational History  . Not on file  Social Needs  . Financial resource strain: Not on file  . Food insecurity    Worry: Not on file    Inability: Not on file  . Transportation needs    Medical: Not on file    Non-medical: Not on file  Tobacco Use  . Smoking status: Never Smoker  . Smokeless tobacco: Never Used  Substance and Sexual Activity  . Alcohol use: Yes    Comment: occassional  . Drug use: No  . Sexual activity: Yes    Birth control/protection: I.U.D.  Lifestyle  . Physical activity    Days per week: Not on file    Minutes per session: Not on file  . Stress: Not on file  Relationships  . Social Herbalist on phone: Not on file    Gets together: Not on file    Attends religious service: Not on file    Active member of club or organization: Not on file    Attends meetings of clubs or organizations: Not on file    Relationship status: Not on file  . Intimate partner violence    Fear of current or ex partner: Not on file     Emotionally abused: Not on file    Physically abused: Not on file    Forced sexual activity: Not on file  Other Topics Concern  . Not on file  Social History Narrative  . Not on file    Review of Systems: 12 system ROS is negative except as noted above except for allergies.   Physical Exam: General:   Alert,  well-nourished, pleasant and cooperative in NAD Head:  Normocephalic and atraumatic. Eyes:  Sclera clear, no icterus.   Conjunctiva pink. Ears:  Normal auditory acuity. Nose:  No deformity, discharge,  or  lesions. Mouth:  No deformity or lesions.   Neck:  Supple; no masses or thyromegaly. Lungs:  Clear throughout to auscultation.   No wheezes. Heart:  Regular rate and rhythm; no murmurs. Abdomen:  Soft, central obesity, nontender, nondistended, normal bowel sounds, no rebound or guarding. No hepatosplenomegaly.   Rectal:  Deferred  Msk:  Symmetrical. No boney deformities LAD: No inguinal or umbilical LAD Extremities:  No clubbing or edema. Neurologic:  Alert and  oriented x4;  grossly nonfocal Skin:  Intact without significant lesions or rashes. Psych:  Alert and cooperative. Normal mood and affect.    Nomar Broad L. Tarri Glenn, MD, MPH 03/03/2019, 1:42 PM

## 2019-03-20 ENCOUNTER — Other Ambulatory Visit: Payer: Self-pay | Admitting: Family Medicine

## 2019-03-20 MED FILL — ACCU-CHEK GUIDE STRP: 90 days supply | Qty: 100 | Fill #2

## 2019-03-20 MED FILL — ACCU-CHEK FASTCLIX LANCETS: 90 days supply | Qty: 102 | Fill #2

## 2019-03-23 MED FILL — FLUTICASONE PROP 50 MCG SPR: 50 | 90 days supply | Qty: 48 | Fill #0

## 2019-03-25 DIAGNOSIS — Z9884 Bariatric surgery status: Secondary | ICD-10-CM | POA: Diagnosis not present

## 2019-03-25 DIAGNOSIS — K219 Gastro-esophageal reflux disease without esophagitis: Secondary | ICD-10-CM | POA: Diagnosis not present

## 2019-03-31 DIAGNOSIS — E46 Unspecified protein-calorie malnutrition: Secondary | ICD-10-CM | POA: Diagnosis not present

## 2019-03-31 DIAGNOSIS — E119 Type 2 diabetes mellitus without complications: Secondary | ICD-10-CM | POA: Diagnosis not present

## 2019-03-31 DIAGNOSIS — Z9884 Bariatric surgery status: Secondary | ICD-10-CM | POA: Diagnosis not present

## 2019-04-16 ENCOUNTER — Other Ambulatory Visit (HOSPITAL_COMMUNITY)
Admission: RE | Admit: 2019-04-16 | Discharge: 2019-04-16 | Disposition: A | Discharge status: Home / Self Care | Payer: 59 | Source: Ambulatory Visit | Attending: Gastroenterology | Admitting: Gastroenterology

## 2019-04-16 DIAGNOSIS — Z01812 Encounter for preprocedural laboratory examination: Secondary | ICD-10-CM | POA: Diagnosis not present

## 2019-04-16 DIAGNOSIS — Z20828 Contact with and (suspected) exposure to other viral communicable diseases: Secondary | ICD-10-CM | POA: Diagnosis not present

## 2019-04-16 LAB — SARS CORONAVIRUS 2 (TAT 6-24 HRS): SARS Coronavirus 2: NEGATIVE

## 2019-04-20 ENCOUNTER — Ambulatory Visit (HOSPITAL_COMMUNITY): Payer: 59 | Admitting: Anesthesiology

## 2019-04-20 ENCOUNTER — Other Ambulatory Visit: Payer: Self-pay

## 2019-04-20 ENCOUNTER — Ambulatory Visit (HOSPITAL_COMMUNITY)
Admission: RE | Admit: 2019-04-20 | Discharge: 2019-04-20 | Disposition: A | Discharge status: Home / Self Care | Payer: 59 | Attending: Gastroenterology | Admitting: Gastroenterology

## 2019-04-20 ENCOUNTER — Encounter (HOSPITAL_COMMUNITY): Payer: Self-pay | Admitting: Gastroenterology

## 2019-04-20 ENCOUNTER — Other Ambulatory Visit: Payer: Self-pay | Admitting: Family Medicine

## 2019-04-20 ENCOUNTER — Encounter (HOSPITAL_COMMUNITY)
Admission: RE | Disposition: A | Discharge status: Home / Self Care | Payer: Self-pay | Source: Home / Self Care | Attending: Gastroenterology

## 2019-04-20 DIAGNOSIS — D123 Benign neoplasm of transverse colon: Secondary | ICD-10-CM | POA: Diagnosis not present

## 2019-04-20 DIAGNOSIS — K219 Gastro-esophageal reflux disease without esophagitis: Secondary | ICD-10-CM | POA: Insufficient documentation

## 2019-04-20 DIAGNOSIS — D122 Benign neoplasm of ascending colon: Secondary | ICD-10-CM | POA: Diagnosis not present

## 2019-04-20 DIAGNOSIS — Z1211 Encounter for screening for malignant neoplasm of colon: Secondary | ICD-10-CM | POA: Diagnosis not present

## 2019-04-20 DIAGNOSIS — K621 Rectal polyp: Secondary | ICD-10-CM | POA: Diagnosis not present

## 2019-04-20 DIAGNOSIS — Z8371 Family history of colonic polyps: Secondary | ICD-10-CM | POA: Diagnosis not present

## 2019-04-20 DIAGNOSIS — Z833 Family history of diabetes mellitus: Secondary | ICD-10-CM | POA: Insufficient documentation

## 2019-04-20 DIAGNOSIS — Z6841 Body Mass Index (BMI) 40.0 and over, adult: Secondary | ICD-10-CM | POA: Diagnosis not present

## 2019-04-20 DIAGNOSIS — K635 Polyp of colon: Secondary | ICD-10-CM

## 2019-04-20 DIAGNOSIS — D128 Benign neoplasm of rectum: Secondary | ICD-10-CM | POA: Insufficient documentation

## 2019-04-20 DIAGNOSIS — M1990 Unspecified osteoarthritis, unspecified site: Secondary | ICD-10-CM | POA: Insufficient documentation

## 2019-04-20 DIAGNOSIS — E119 Type 2 diabetes mellitus without complications: Secondary | ICD-10-CM | POA: Diagnosis not present

## 2019-04-20 DIAGNOSIS — Z9884 Bariatric surgery status: Secondary | ICD-10-CM | POA: Diagnosis not present

## 2019-04-20 DIAGNOSIS — Z79899 Other long term (current) drug therapy: Secondary | ICD-10-CM | POA: Diagnosis not present

## 2019-04-20 HISTORY — PX: COLONOSCOPY WITH PROPOFOL: SHX5780

## 2019-04-20 HISTORY — PX: POLYPECTOMY: SHX5525

## 2019-04-20 LAB — GLUCOSE, CAPILLARY: Glucose-Capillary: 101 mg/dL — ABNORMAL HIGH (ref 70–99)

## 2019-04-20 LAB — PREGNANCY, URINE: Preg Test, Ur: NEGATIVE

## 2019-04-20 SURGERY — COLONOSCOPY WITH PROPOFOL
Anesthesia: Monitor Anesthesia Care

## 2019-04-20 MED ORDER — SODIUM CHLORIDE 0.9 % IV SOLN: INTRAVENOUS | Status: DC

## 2019-04-20 MED ORDER — LACTATED RINGERS IV SOLN
INTRAVENOUS | Status: AC | PRN
  Administered 2019-04-20: 1000 mL via INTRAVENOUS

## 2019-04-20 MED ORDER — PROPOFOL 10 MG/ML IV BOLUS
INTRAVENOUS | Status: AC
  Filled 2019-04-20: qty 20

## 2019-04-20 MED ORDER — ONDANSETRON HCL 4 MG/2ML IJ SOLN
INTRAMUSCULAR | Status: DC | PRN
  Administered 2019-04-20: 4 mg via INTRAVENOUS

## 2019-04-20 MED ORDER — PROPOFOL 500 MG/50ML IV EMUL
INTRAVENOUS | Status: AC
  Filled 2019-04-20: qty 50

## 2019-04-20 MED ORDER — PROPOFOL 500 MG/50ML IV EMUL
INTRAVENOUS | Status: DC | PRN
  Administered 2019-04-20: 125 ug/kg/min via INTRAVENOUS

## 2019-04-20 MED ORDER — PROPOFOL 500 MG/50ML IV EMUL
INTRAVENOUS | Status: DC | PRN
  Administered 2019-04-20 (×3): 20 mg via INTRAVENOUS
  Administered 2019-04-20: 40 mg via INTRAVENOUS

## 2019-04-20 MED FILL — PANTOPRAZOLE SOD DR 40 MG T: 40 | 90 days supply | Qty: 90 | Fill #0

## 2019-04-20 SURGICAL SUPPLY — 21 items

## 2019-04-20 NOTE — Op Note (Signed)
[facility] Patient Name: [name] Procedure Date: 04/20/2019 MRN: [id number] Attending MD: Thornton Park MD, MD Date of Birth: 11-07-1975 CSN: [id number] Age: 43 Admit Type: Outpatient Procedure:                Colonoscopy Indications:              Colon cancer screening in patient at increased                            risk: Family history of 1st-degree relative with                            colon polyps before age 29 years, This is the                            patient's first colonoscopy                           Family history of colon polyps (mother, maternal                            aunts, all before 37) Providers:                Thornton Park MD, MD, Marguerita Merles,                            Technician, Corie Chiquito, Technician, Rosario Adie, CRNA, Glori Bickers, RN Referring MD:              Medicines:                Monitored Anesthesia Care Complications:            No immediate complications. Estimated blood loss:                            Minimal. Estimated Blood Loss:     Estimated blood loss was minimal. Procedure:                Pre-Anesthesia Assessment:                           - Prior to the procedure, a History and Physical                            was performed, and patient medications and                            allergies were reviewed. The patient's tolerance of                            previous anesthesia was also reviewed. The risks                            and benefits of the procedure and the sedation  options and risks were discussed with the patient.                            All questions were answered, and informed consent                            was obtained. Prior Anticoagulants: The patient has                            taken no previous anticoagulant or antiplatelet                            agents. ASA Grade Assessment: III - A patient with                             severe systemic disease. After reviewing the risks                            and benefits, the patient was deemed in                            satisfactory condition to undergo the procedure.                           After obtaining informed consent, the colonoscope                            was passed under direct vision. Throughout the                            procedure, the patient's blood pressure, pulse, and                            oxygen saturations were monitored continuously. The                            [id number]) Olympus colonoscope was                            introduced through the anus and advanced to the the                            terminal ileum, with identification of the                            appendiceal orifice and IC valve. A second forward                            view of the right colon was performed. The                            colonoscopy was performed without difficulty. The  patient tolerated the procedure well. The quality                            of the bowel preparation was good. The terminal                            ileum, ileocecal valve, appendiceal orifice, and                            rectum were photographed. Scope In: 11:30:36 AM Scope Out: 11:52:58 AM Scope Withdrawal Time: 0 hours 20 minutes 1 second  Total Procedure Duration: 0 hours 22 minutes 22 seconds  Findings:      The perianal and digital rectal examinations were normal.      A 2 mm polyp was found in the rectum. The polyp was flat. The polyp was       removed with a cold snare. Resection and retrieval were complete.       Estimated blood loss was minimal.      A 4 mm polyp was found in the proximal transverse colon. The polyp was       sessile. The polyp was removed with a cold snare. Resection and       retrieval were complete. Estimated blood loss was minimal.      A 2 mm polyp was found in the  ascending colon. The polyp was flat. The       polyp was removed with a cold snare. Resection and retrieval were       complete. Estimated blood loss was minimal.      The terminal ileum appeared normal. The exam was otherwise without       abnormality on direct and retroflexion views. Impression:               - One 2 mm polyp in the rectum, removed with a cold                            snare. Resected and retrieved.                           - One 4 mm polyp in the proximal transverse colon,                            removed with a cold snare. Resected and retrieved.                           - One 2 mm polyp in the ascending colon, removed                            with a cold snare. Resected and retrieved.                           - The examination was otherwise normal on direct                            and retroflexion views. Moderate Sedation:      Not Applicable - Patient had care per Anesthesia.  Recommendation:           - Patient has a contact number available for                            emergencies. The signs and symptoms of potential                            delayed complications were discussed with the                            patient. Return to normal activities tomorrow.                            Written discharge instructions were provided to the                            patient.                           - Resume previous diet.                           - Continue present medications.                           - Await pathology results.                           - Repeat colonoscopy date to be determined after                            pending pathology results are reviewed for                            surveillance. If three polyps are adenomas, repeat                            colonoscopy in 3 years. Otherwise, repeat                            colonoscopy in 5 years. Procedure Code(s):        --- Professional ---                           [id number],  Colonoscopy, flexible; with removal of                            tumor(s), polyp(s), or other lesion(s) by snare                            technique Diagnosis Code(s):        --- Professional ---                           Z83.71, Family history of colonic polyps  K62.1, Rectal polyp                           K63.5, Polyp of colon CPT copyright 2019 American Medical Association. All rights reserved. The codes documented in this report are preliminary and upon coder review may  be revised to meet current compliance requirements. Thornton Park MD, MD 04/20/2019 12:00:22 PM This report has been signed electronically. Number of Addenda: 0

## 2019-04-20 NOTE — Anesthesia Postprocedure Evaluation (Signed)
Anesthesia Post Note  Patient: [name]  Procedure(s) Performed: COLONOSCOPY WITH PROPOFOL (N/A ) POLYPECTOMY     Patient location during evaluation: PACU Anesthesia Type: MAC Level of consciousness: awake and alert Pain management: pain level controlled Vital Signs Assessment: post-procedure vital signs reviewed and stable Respiratory status: spontaneous breathing Cardiovascular status: stable Anesthetic complications: no    Last Vitals:  Vitals:   04/20/19 1210 04/20/19 1220  BP: 118/69 131/71  Pulse: (!) 50 (!) 47  Resp: 12 13  Temp:    SpO2: 100% 100%    Last Pain:  Vitals:   04/20/19 1201  TempSrc: Temporal  PainSc: 0-No pain                 Nolon Nations

## 2019-04-20 NOTE — Transfer of Care (Signed)
Immediate Anesthesia Transfer of Care Note  Patient: SHANDA CADOTTE  Procedure(s) Performed: COLONOSCOPY WITH PROPOFOL (N/A ) POLYPECTOMY  Patient Location: PACU  Anesthesia Type:MAC  Level of Consciousness: drowsy, patient cooperative and responds to stimulation  Airway & Oxygen Therapy: Patient Spontanous Breathing and Patient connected to face mask oxygen  Post-op Assessment: Report given to RN and Post -op Vital signs reviewed and stable  Post vital signs: Reviewed and stable  Last Vitals:  Vitals Value Taken Time  BP    Temp    Pulse 50 04/20/19 1201  Resp 18 04/20/19 1201  SpO2 100 % 04/20/19 1201  Vitals shown include unvalidated device data.  Last Pain:  Vitals:   04/20/19 1029  TempSrc: Oral  PainSc: 0-No pain         Complications: No apparent anesthesia complications

## 2019-04-20 NOTE — H&P (Signed)
Referring Provider: No ref. provider found Primary Care Physician:  Fayrene Helper, MD  Reason for Consultation: Family history of colon polyps   IMPRESSION:  Family history of colon polyps (mother, maternal aunts, all before 99) BMI 54.5 No prior colon cancer screening  Colonoscopy recommended given the patient's age as well as her family history of colon polyps.  PLAN: Colonoscopy at the hospital  The nature of the procedure, as well as the risks, benefits, and alternatives were carefully and thoroughly reviewed with the patient. Ample time for discussion and questions allowed. The patient understood, was satisfied, and agreed to proceed.   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  She is referred by Dr. Moshe Cipro for a family history of colon polyps.  The history is obtained to the patient and review of her electronic health record. She has type 2 diabetes, obesity, allergic rhinitis, dyslipidemia, and a history of reflux. She had a gastric sleeve 2 years ago.  Mom and two maternal aunts with colon polyps before age 60. No other known family history of colon cancer or polyps. No family history of uterine/endometrial cancer, pancreatic cancer or gastric/stomach cancer.  Has history of reflux that developed during her first pregnancy controlled on Protonix 40 mg daily. Will have breakthrough symptoms if she misses a dose or two while waiting for prescription refills.  She is satisfied with her current regimen.  No prior colon cancer screening or colonoscopy.  There is no dysphagia, odynophagia, regurgitation,  nausea, abdominal pain, change in bowel habits, melena, hematochezia, or bright red blood per rectum. There is no anorexia or recent change in weight.   Prior abdominal imaging: Upper GI series with KUB 01/19/2016: Normal Abdominal ultrasound 01/19/2016: Normal    Past Medical History:  Diagnosis Date  . Allergy   . Anemia   . Arthritis     . Diabetes mellitus    type II   . GERD (gastroesophageal reflux disease)   . PONV (postoperative nausea and vomiting)     Past Surgical History:  Procedure Laterality Date  . CARDIAC CATHETERIZATION    . CESAREAN SECTION     x 2  . LAPAROSCOPIC GASTRIC SLEEVE RESECTION N/A 04/02/2016   Procedure: LAPAROSCOPIC GASTRIC SLEEVE RESECTION, UPPER ENDO;  Surgeon: Johnathan Hausen, MD;  Location: [facility];  Service: General;  Laterality: N/A;    Current Facility-Administered Medications  Medication Dose Route Frequency Provider Last Rate Last Admin  . 0.9 %  sodium chloride infusion   Intravenous Continuous Thornton Park, MD      . lactated ringers infusion    Continuous PRN Thornton Park, MD 20 mL/hr at 04/20/19 1053 1,000 mL at 04/20/19 1053    Allergies as of 03/03/2019  . (No Known Allergies)    Family History  Problem Relation Age of Onset  . Hypertension Mother   . Colon polyps Mother   . COPD Father   . Diabetes Father   . Heart disease Father   . Hypertension Father   . Kidney disease Father   . Stroke Father   . Diabetes Daughter   . Diabetes Daughter   . Colon polyps Maternal Aunt     Social History   Socioeconomic History  . Marital status: Married    Spouse name: Not on file  . Number of children: 2  . Years of education: Not on file  . Highest education level: Not on file  Occupational History  .  Not on file  Tobacco Use  . Smoking status: Never Smoker  . Smokeless tobacco: Never Used  Substance and Sexual Activity  . Alcohol use: Yes    Comment: occassional  . Drug use: No  . Sexual activity: Yes    Birth control/protection: I.U.D.  Other Topics Concern  . Not on file  Social History Narrative  . Not on file   Social Determinants of Health   Financial Resource Strain:   . Difficulty of Paying Living Expenses: Not on file  Food Insecurity:   . Worried About Charity fundraiser in the Last Year: Not on file  . Ran Out of Food in the  Last Year: Not on file  Transportation Needs:   . Lack of Transportation (Medical): Not on file  . Lack of Transportation (Non-Medical): Not on file  Physical Activity:   . Days of Exercise per Week: Not on file  . Minutes of Exercise per Session: Not on file  Stress:   . Feeling of Stress : Not on file  Social Connections:   . Frequency of Communication with Friends and Family: Not on file  . Frequency of Social Gatherings with Friends and Family: Not on file  . Attends Religious Services: Not on file  . Active Member of Clubs or Organizations: Not on file  . Attends Archivist Meetings: Not on file  . Marital Status: Not on file  Intimate Partner Violence:   . Fear of Current or Ex-Partner: Not on file  . Emotionally Abused: Not on file  . Physically Abused: Not on file  . Sexually Abused: Not on file    Review of Systems: 12 system ROS is negative except as noted above except for allergies.   Physical Exam: General:   Alert,  well-nourished, pleasant and cooperative in NAD Head:  Normocephalic and atraumatic. Eyes:  Sclera clear, no icterus.   Conjunctiva pink. Ears:  Normal auditory acuity. Nose:  No deformity, discharge,  or lesions. Mouth:  No deformity or lesions.   Neck:  Supple; no masses or thyromegaly. Lungs:  Clear throughout to auscultation.   No wheezes. Heart:  Regular rate and rhythm; no murmurs. Abdomen:  Soft, central obesity, nontender, nondistended, normal bowel sounds, no rebound or guarding. No hepatosplenomegaly.   Rectal:  Deferred  Msk:  Symmetrical. No boney deformities LAD: No inguinal or umbilical LAD Extremities:  No clubbing or edema. Neurologic:  Alert and  oriented x4;  grossly nonfocal Skin:  Intact without significant lesions or rashes. Psych:  Alert and cooperative. Normal mood and affect.    Kennieth Plotts L. Tarri Glenn, MD, MPH 04/20/2019, 11:14 AM

## 2019-04-20 NOTE — Discharge Instructions (Signed)
YOU HAD AN ENDOSCOPIC PROCEDURE TODAY: Refer to the procedure report and other information in the discharge instructions given to you for any specific questions about what was found during the examination. If this information does not answer your questions, please call [place] office at [phone] to clarify.  ° °YOU SHOULD EXPECT: Some feelings of bloating in the abdomen. Passage of more gas than usual. Walking can help get rid of the air that was put into your GI tract during the procedure and reduce the bloating. If you had a lower endoscopy (such as a colonoscopy or flexible sigmoidoscopy) you may notice spotting of blood in your stool or on the toilet paper. Some abdominal soreness may be present for a day or two, also. ° °DIET: Your first meal following the procedure should be a light meal and then it is ok to progress to your normal diet. A half-sandwich or bowl of soup is an example of a good first meal. Heavy or fried foods are harder to digest and may make you feel nauseous or bloated. Drink plenty of fluids but you should avoid alcoholic beverages for 24 hours. If you had a esophageal dilation, please see attached instructions for diet.   ° °ACTIVITY: Your care partner should take you home directly after the procedure. You should plan to take it easy, moving slowly for the rest of the day. You can resume normal activity the day after the procedure however YOU SHOULD NOT DRIVE, use power tools, machinery or perform tasks that involve climbing or major physical exertion for 24 hours (because of the sedation medicines used during the test).  ° °SYMPTOMS TO REPORT IMMEDIATELY: °A gastroenterologist can be reached at any hour. Please call [phone]  for any of the following symptoms:  °Following lower endoscopy (colonoscopy, flexible sigmoidoscopy) °Excessive amounts of blood in the stool  °Significant tenderness, worsening of abdominal pains  °Swelling of the abdomen that is new, acute  °Fever of 100° or  higher  °Following upper endoscopy (EGD, EUS, ERCP, esophageal dilation) °Vomiting of blood or coffee ground material  °New, significant abdominal pain  °New, significant chest pain or pain under the shoulder blades  °Painful or persistently difficult swallowing  °New shortness of breath  °Black, tarry-looking or red, bloody stools ° °FOLLOW UP:  °If any biopsies were taken you will be contacted by phone or by letter within the next 1-3 weeks. Call [phone]  if you have not heard about the biopsies in 3 weeks.  °Please also call with any specific questions about appointments or follow up tests. ° °

## 2019-04-20 NOTE — Anesthesia Preprocedure Evaluation (Addendum)
Anesthesia Evaluation  Patient identified by MRN, date of birth, ID band Patient awake    Reviewed: Allergy & Precautions, NPO status , Patient's Chart, lab work & pertinent test results  History of Anesthesia Complications (+) PONV  Airway Mallampati: II  TM Distance: >3 FB Neck ROM: Full    Dental  (+) Dental Advisory Given, Teeth Intact   Pulmonary neg pulmonary ROS,    breath sounds clear to auscultation       Cardiovascular negative cardio ROS   Rhythm:Regular Rate:Normal + Systolic murmurs    Neuro/Psych negative neurological ROS     GI/Hepatic Neg liver ROS, GERD  ,  Endo/Other  diabetes, Type 2, Oral Hypoglycemic AgentsMorbid obesity  Renal/GU negative Renal ROS     Musculoskeletal  (+) Arthritis ,   Abdominal (+) + obese,   Peds  Hematology negative hematology ROS (+) anemia ,   Anesthesia Other Findings   Reproductive/Obstetrics                            Lab Results  Component Value Date   WBC 6.2 08/25/2018   HGB 11.2 (L) 08/25/2018   HCT 34.7 (L) 08/25/2018   MCV 87.2 08/25/2018   PLT 281 08/25/2018   Lab Results  Component Value Date   CREATININE 0.63 01/13/2019   BUN 18 01/13/2019   NA 140 01/13/2019   K 4.1 01/13/2019   CL 106 01/13/2019   CO2 22 01/13/2019    Anesthesia Physical  Anesthesia Plan  ASA: III  Anesthesia Plan: MAC   Post-op Pain Management:    Induction: Intravenous  PONV Risk Score and Plan: 3 and Ondansetron, Propofol infusion, Treatment may vary due to age or medical condition and TIVA  Airway Management Planned:   Additional Equipment: None  Intra-op Plan:   Post-operative Plan:   Informed Consent: I have reviewed the patients History and Physical, chart, labs and discussed the procedure including the risks, benefits and alternatives for the proposed anesthesia with the patient or authorized representative who has indicated  his/her understanding and acceptance.     Dental advisory given  Plan Discussed with: CRNA  Anesthesia Plan Comments:         Anesthesia Quick Evaluation

## 2019-04-21 ENCOUNTER — Encounter: Payer: Self-pay | Admitting: Gastroenterology

## 2019-04-21 LAB — SURGICAL PATHOLOGY

## 2019-04-22 ENCOUNTER — Encounter: Payer: Self-pay | Admitting: *Deleted

## 2019-04-28 ENCOUNTER — Ambulatory Visit: Payer: 59 | Attending: Internal Medicine

## 2019-04-28 ENCOUNTER — Other Ambulatory Visit: Payer: Self-pay

## 2019-04-28 DIAGNOSIS — Z20822 Contact with and (suspected) exposure to covid-19: Secondary | ICD-10-CM | POA: Diagnosis not present

## 2019-04-30 LAB — NOVEL CORONAVIRUS, NAA: SARS-CoV-2, NAA: NOT DETECTED

## 2019-05-18 DIAGNOSIS — E559 Vitamin D deficiency, unspecified: Secondary | ICD-10-CM | POA: Diagnosis not present

## 2019-05-18 DIAGNOSIS — D509 Iron deficiency anemia, unspecified: Secondary | ICD-10-CM | POA: Diagnosis not present

## 2019-05-18 DIAGNOSIS — E785 Hyperlipidemia, unspecified: Secondary | ICD-10-CM | POA: Diagnosis not present

## 2019-05-18 DIAGNOSIS — E669 Obesity, unspecified: Secondary | ICD-10-CM | POA: Diagnosis not present

## 2019-05-18 DIAGNOSIS — E1169 Type 2 diabetes mellitus with other specified complication: Secondary | ICD-10-CM | POA: Diagnosis not present

## 2019-05-19 ENCOUNTER — Encounter: Payer: Self-pay | Admitting: Family Medicine

## 2019-05-19 LAB — CBC
HCT: 35.8 % (ref 35.0–45.0)
Hemoglobin: 11.5 g/dL — ABNORMAL LOW (ref 11.7–15.5)
MCH: 28.2 pg (ref 27.0–33.0)
MCHC: 32.1 g/dL (ref 32.0–36.0)
MCV: 87.7 fL (ref 80.0–100.0)
MPV: 11.6 fL (ref 7.5–12.5)
Platelets: 267 10*3/uL (ref 140–400)
RBC: 4.08 10*6/uL (ref 3.80–5.10)
RDW: 12.9 % (ref 11.0–15.0)
WBC: 5.7 10*3/uL (ref 3.8–10.8)

## 2019-05-19 LAB — COMPLETE METABOLIC PANEL WITH GFR
AG Ratio: 1.5 (calc) (ref 1.0–2.5)
ALT: 7 U/L (ref 6–29)
AST: 9 U/L — ABNORMAL LOW (ref 10–35)
Albumin: 4 g/dL (ref 3.6–5.1)
Alkaline phosphatase (APISO): 56 U/L (ref 31–125)
BUN: 12 mg/dL (ref 7–25)
CO2: 24 mmol/L (ref 20–32)
Calcium: 9 mg/dL (ref 8.6–10.2)
Chloride: 106 mmol/L (ref 98–110)
Creat: 0.59 mg/dL (ref 0.50–1.10)
GFR, Est African American: 128 mL/min/{1.73_m2} (ref >=60)
GFR, Est Non African American: 111 mL/min/{1.73_m2} (ref >=60)
Globulin: 2.7 g/dL (calc) (ref 1.9–3.7)
Glucose, Bld: 107 mg/dL — ABNORMAL HIGH (ref 65–99)
Potassium: 4.2 mmol/L (ref 3.5–5.3)
Sodium: 138 mmol/L (ref 135–146)
Total Bilirubin: 0.4 mg/dL (ref 0.2–1.2)
Total Protein: 6.7 g/dL (ref 6.1–8.1)

## 2019-05-19 LAB — LIPID PANEL
Cholesterol: 170 mg/dL (ref <200)
HDL: 55 mg/dL (ref >=50)
LDL Cholesterol (Calc): 101 mg/dL (calc) — ABNORMAL HIGH
Non-HDL Cholesterol (Calc): 115 mg/dL (calc) (ref <130)
Total CHOL/HDL Ratio: 3.1 (calc) (ref <5.0)
Triglycerides: 47 mg/dL (ref <150)

## 2019-05-19 LAB — HEMOGLOBIN A1C
Hgb A1c MFr Bld: 6.2 % of total Hgb — ABNORMAL HIGH (ref <5.7)
Mean Plasma Glucose: 131 (calc)
eAG (mmol/L): 7.3 (calc)

## 2019-05-19 LAB — VITAMIN D 25 HYDROXY (VIT D DEFICIENCY, FRACTURES): Vit D, 25-Hydroxy: 12 ng/mL — ABNORMAL LOW (ref 30–100)

## 2019-05-19 LAB — TSH: TSH: 2.6 mIU/L

## 2019-05-20 ENCOUNTER — Encounter: Payer: Self-pay | Admitting: Family Medicine

## 2019-05-20 ENCOUNTER — Other Ambulatory Visit: Payer: Self-pay

## 2019-05-20 ENCOUNTER — Telehealth (INDEPENDENT_AMBULATORY_CARE_PROVIDER_SITE_OTHER): Payer: 59 | Admitting: Family Medicine

## 2019-05-20 VITALS — BP 131/71 | Ht 62.0 in | Wt 292.0 lb

## 2019-05-20 DIAGNOSIS — E669 Obesity, unspecified: Secondary | ICD-10-CM | POA: Diagnosis not present

## 2019-05-20 DIAGNOSIS — E538 Deficiency of other specified B group vitamins: Secondary | ICD-10-CM

## 2019-05-20 DIAGNOSIS — E1169 Type 2 diabetes mellitus with other specified complication: Secondary | ICD-10-CM | POA: Diagnosis not present

## 2019-05-20 DIAGNOSIS — H608X3 Other otitis externa, bilateral: Secondary | ICD-10-CM | POA: Diagnosis not present

## 2019-05-20 DIAGNOSIS — H6090 Unspecified otitis externa, unspecified ear: Secondary | ICD-10-CM | POA: Insufficient documentation

## 2019-05-20 DIAGNOSIS — E785 Hyperlipidemia, unspecified: Secondary | ICD-10-CM | POA: Diagnosis not present

## 2019-05-20 DIAGNOSIS — M25561 Pain in right knee: Secondary | ICD-10-CM

## 2019-05-20 MED ORDER — HYDROCORTISONE-ACETIC ACID 1-2 % OT SOLN: OTIC | 0 refills | Status: DC

## 2019-05-20 MED ORDER — IBUPROFEN 800 MG PO TABS: 800.0000 mg | ORAL_TABLET | Freq: Three times a day (TID) | ORAL | 0 refills | Status: DC

## 2019-05-20 MED ORDER — PHENTERMINE HCL 37.5 MG PO TABS: ORAL_TABLET | ORAL | 1 refills | Status: DC

## 2019-05-20 MED ORDER — ERGOCALCIFEROL 1.25 MG (50000 UT) PO CAPS: 50000.0000 [IU] | ORAL_CAPSULE | ORAL | 2 refills | Status: DC

## 2019-05-20 MED ORDER — METFORMIN HCL 500 MG PO TABS: 500.0000 mg | ORAL_TABLET | Freq: Two times a day (BID) | ORAL | 1 refills | Status: DC

## 2019-05-20 MED ORDER — PANTOPRAZOLE SODIUM 40 MG PO TBEC: 40.0000 mg | DELAYED_RELEASE_TABLET | Freq: Every day | ORAL | 1 refills | Status: DC

## 2019-05-20 MED ORDER — PREDNISONE 10 MG PO TABS: ORAL_TABLET | ORAL | 0 refills | Status: DC

## 2019-05-20 MED ORDER — GLIPIZIDE ER 5 MG PO TB24: 5.0000 mg | ORAL_TABLET | Freq: Every day | ORAL | 1 refills | Status: DC

## 2019-05-20 MED FILL — metFORMIN HCL 500 MG TABS: 500 | 90 days supply | Qty: 180 | Fill #0

## 2019-05-20 MED FILL — VIT D2 1.25 MG (50,000 UNIT: 1.25 MG | 84 days supply | Qty: 12 | Fill #0

## 2019-05-20 MED FILL — HYDROCORTISON-ACETIC ACID S: 1-2 | 13 days supply | Qty: 10 | Fill #0

## 2019-05-20 MED FILL — predniSONE 10 MG TABS: 10 | 6 days supply | Qty: 12 | Fill #0

## 2019-05-20 MED FILL — glipiZIDE XL 5 MG TB24: 5 | 90 days supply | Qty: 90 | Fill #0

## 2019-05-20 MED FILL — IBUPROFEN 800 MG TABS: 800 | 7 days supply | Qty: 21 | Fill #0

## 2019-05-20 NOTE — Patient Instructions (Addendum)
F/U in office with MD in 5 months, call if you need me sooner  Short course of prednisone and ibuprofen are prescribed for knee , if no better call/ message fir referral to Dr Aline Brochure please  Please commit to one weekly vit D and daily iron , you need both  Excellent labs overall and very good weight loss, keep up both  Drop to be used in ears for itching is  Prescribed  HBA1C, chem 7 and EGFR , iron and ferritin level non fast 1 week before next visit  It is important that you exercise regularly at least 30 minutes 5 times a week. If you develop chest pain, have severe difficulty breathing, or feel very tired, stop exercising immediately and seek medical attention   Think about what you will eat, plan ahead. Choose " clean, green, fresh or frozen" over canned, processed or packaged foods which are more sugary, salty and fatty. 70 to 75% of food eaten should be vegetables and fruit. Three meals at set times with snacks allowed between meals, but they must be fruit or vegetables. Aim to eat over a 12 hour period , example 7 am to 7 pm, and STOP after  your last meal of the day. Drink water,generally about 64 ounces per day, no other drink is as healthy. Fruit juice is best enjoyed in a healthy way, by EATING the fruit.  Thanks for choosing [facility], we consider it a privelige to serve you.

## 2019-05-20 NOTE — Progress Notes (Signed)
Virtual Visit via Telephone Note  I connected with Nelson Chimes on 05/20/19 at  3:40 PM EST by telephone and verified that I am speaking with the correct person using two identifiers.  Location: Patient: home Provider: office   I discussed the limitations, risks, security and privacy concerns of performing an evaluation and management service by telephone and the availability of in person appointments. I also discussed with the patient that there may be a patient responsible charge related to this service. The patient expressed understanding and agreed to proceed.   History of Present Illness: F/U chronic problems, medication review, and refill medication when necessary. Review most recent labs and order labs which are due Review preventive health and update with necessary referrals or immunizations as indicated Denies recent fever or chills. Denies sinus pressure, nasal congestion, ear pain or sore throat. Denies chest congestion, productive cough or wheezing. Denies chest pains, palpitations and leg swelling Denies abdominal pain, nausea, vomiting,diarrhea or constipation.   Denies dysuria, frequency, hesitancy or incontinence. . Denies headaches, seizures, numbness, or tingling. Denies depression, anxiety or insomnia. Denies skin break down or rash.       2 week h/o unprovoked right knee pain wakes pt , occasional buckling, has ahd problems in the past with knees Denies polyuria, polydipsia, blurred vision , or hypoglycemic episodes.   Itchy right outer ear, wants steroid drop Observations/Objective: BP 131/71   Ht 5\' 2"  (1.575 m)   Wt 292 lb (132.5 kg)   BMI 53.41 kg/m  Good communication with no confusion and intact memory. Alert and oriented x 3 No signs of respiratory distress during speech    Assessment and Plan: Diabetes mellitus type 2 in obese Controlled, no change in medication Ms. Atmore is reminded of the importance of commitment to daily physical  activity for 30 minutes or more, as able and the need to limit carbohydrate intake to 30 to 60 grams per meal to help with blood sugar control.   The need to take medication as prescribed, test blood sugar as directed, and to call between visits if there is a concern that blood sugar is uncontrolled is also discussed.   Ms. Kabacinski is reminded of the importance of daily foot exam, annual eye examination, and good blood sugar, blood pressure and cholesterol control.  Diabetic Labs Latest Ref Rng & Units 05/18/2019 01/13/2019 08/25/2018 04/30/2018 01/03/2018  HbA1c <5.7 % of total Hgb 6.2(H) 6.0(H) 7.4(H) 5.9(H) -  Microalbumin mg/dL - 1.9 - - 6.9(H)  Micro/Creat Ratio 0.0 - 30.0 mg/g creat - - - - 3.6  Chol <200 mg/dL 170 - 155 - -  HDL > OR = 50 mg/dL 55 - 50 - -  Calc LDL mg/dL (calc) 101(H) - 91 - -  Triglycerides <150 mg/dL 47 - 46 - -  Creatinine 0.50 - 1.10 mg/dL 0.59 0.63 0.61 0.60 -   BP/Weight 05/20/2019 04/20/2019 03/03/2019 01/19/2019 11/04/2018 10/07/2018 [id number]  Systolic BP A999333 A999333 [id number] A999333 - 94 [id number]  Diastolic BP 71 71 82 70 - 65 64  Wt. (Lbs) 292 289 298 305 310 31.2 317.12  BMI 53.41 52.86 54.5 55.79 56.7 5.67 57.08   Foot/eye exam completion dates Latest Ref Rng & Units 01/19/2019 06/09/2018  Eye Exam No Retinopathy - No Retinopathy  Foot Form Completion - Done -        Otitis externa Topical steroid twice daily for 1 week , then as needed  Morbid obesity [facility])  Patient re-educated about  the importance  of commitment to a  minimum of 150 minutes of exercise per week as able.  The importance of healthy food choices with portion control discussed, as well as eating regularly and within a 12 hour window most days. The need to choose "clean , green" food 50 to 75% of the time is discussed, as well as to make water the primary drink and set a goal of 64 ounces water daily.    Weight /BMI 05/20/2019 04/20/2019 03/03/2019  WEIGHT 292 lb 289 lb 298 lb  HEIGHT 5\' 2"  - 5\' 2"   BMI  53.41 kg/m2 52.86 kg/m2 54.5 kg/m2    Continue phentermine 3 times  weekly  Hyperlipidemia LDL goal <100 Hyperlipidemia:Low fat diet discussed and encouraged.   Lipid Panel  Lab Results  Component Value Date   CHOL 170 05/18/2019   HDL 55 05/18/2019   LDLCALC 101 (H) 05/18/2019   TRIG 47 05/18/2019   CHOLHDL 3.1 05/18/2019  needs to reduce fat in diet, no med change     Knee pain, right 2 week history Ibuprofen and prednisone short term, if no benefit refer ortho, work on weight loss and muscle strengthening    Follow Up Instructions:    I discussed the assessment and treatment plan with the patient. The patient was provided an opportunity to ask questions and all were answered. The patient agreed with the plan and demonstrated an understanding of the instructions.   The patient was advised to call back or seek an in-person evaluation if the symptoms worsen or if the condition fails to improve as anticipated.  I provided 22 minutes of non-face-to-face time during this encounter.   Tula Nakayama, MD

## 2019-05-21 ENCOUNTER — Ambulatory Visit: Payer: 59 | Admitting: Family Medicine

## 2019-05-21 ENCOUNTER — Encounter: Payer: Self-pay | Admitting: Family Medicine

## 2019-05-21 DIAGNOSIS — M25561 Pain in right knee: Secondary | ICD-10-CM | POA: Insufficient documentation

## 2019-05-21 NOTE — Assessment & Plan Note (Signed)
  Patient re-educated about  the importance of commitment to a  minimum of 150 minutes of exercise per week as able.  The importance of healthy food choices with portion control discussed, as well as eating regularly and within a 12 hour window most days. The need to choose "clean , green" food 50 to 75% of the time is discussed, as well as to make water the primary drink and set a goal of 64 ounces water daily.    Weight /BMI 05/20/2019 04/20/2019 03/03/2019  WEIGHT 292 lb 289 lb 298 lb  HEIGHT 5\' 2"  - 5\' 2"   BMI 53.41 kg/m2 52.86 kg/m2 54.5 kg/m2    Continue phentermine 3 times  weekly

## 2019-05-21 NOTE — Assessment & Plan Note (Signed)
Controlled, no change in medication [name] is reminded of the importance of commitment to daily physical activity for 30 minutes or more, as able and the need to limit carbohydrate intake to 30 to 60 grams per meal to help with blood sugar control.   The need to take medication as prescribed, test blood sugar as directed, and to call between visits if there is a concern that blood sugar is uncontrolled is also discussed.   [name] is reminded of the importance of daily foot exam, annual eye examination, and good blood sugar, blood pressure and cholesterol control.  Diabetic Labs Latest Ref Rng & Units 05/18/2019 01/13/2019 08/25/2018 04/30/2018 01/03/2018  HbA1c <5.7 % of total Hgb 6.2(H) 6.0(H) 7.4(H) 5.9(H) -  Microalbumin mg/dL - 1.9 - - 6.9(H)  Micro/Creat Ratio 0.0 - 30.0 mg/g creat - - - - 3.6  Chol <200 mg/dL 170 - 155 - -  HDL > OR = 50 mg/dL 55 - 50 - -  Calc LDL mg/dL (calc) 101(H) - 91 - -  Triglycerides <150 mg/dL 47 - 46 - -  Creatinine 0.50 - 1.10 mg/dL 0.59 0.63 0.61 0.60 -   BP/Weight 05/20/2019 04/20/2019 03/03/2019 01/19/2019 11/04/2018 10/07/2018 [id number]  Systolic BP A999333 A999333 [id number] A999333 - 94 [id number]  Diastolic BP 71 71 82 70 - 65 64  Wt. (Lbs) 292 289 298 305 310 31.2 317.12  BMI 53.41 52.86 54.5 55.79 56.7 5.67 57.08   Foot/eye exam completion dates Latest Ref Rng & Units 01/19/2019 06/09/2018  Eye Exam No Retinopathy - No Retinopathy  Foot Form Completion - Done -

## 2019-05-21 NOTE — Assessment & Plan Note (Signed)
Hyperlipidemia:Low fat diet discussed and encouraged.   Lipid Panel  Lab Results  Component Value Date   CHOL 170 05/18/2019   HDL 55 05/18/2019   LDLCALC 101 (H) 05/18/2019   TRIG 47 05/18/2019   CHOLHDL 3.1 05/18/2019  needs to reduce fat in diet, no med change

## 2019-05-21 NOTE — Assessment & Plan Note (Signed)
Topical steroid twice daily for 1 week , then as needed

## 2019-05-21 NOTE — Assessment & Plan Note (Signed)
2 week history Ibuprofen and prednisone short term, if no benefit refer ortho, work on weight loss and muscle strengthening

## 2019-07-27 MED FILL — PANTOPRAZOLE SOD DR 40 MG T: 40 | 90 days supply | Qty: 90 | Fill #0

## 2019-07-28 ENCOUNTER — Other Ambulatory Visit: Payer: Self-pay | Admitting: Family Medicine

## 2019-08-19 ENCOUNTER — Telehealth: Payer: Self-pay

## 2019-08-19 NOTE — Telephone Encounter (Signed)
The dose will stay the same, as Dr Moshe Cipro reduced it in Jan after labs. She was over corrected and the reduction was needed. Please refill as currently ordered.

## 2019-08-19 NOTE — Telephone Encounter (Signed)
Currently on 500 mg tablet can this be changed to 1000mg ?

## 2019-08-19 NOTE — Telephone Encounter (Signed)
Pt is calling regarding Metformin, the correct dosage is 1000mg  tablet once a day --can this be called in   Called into [facility]

## 2019-08-21 ENCOUNTER — Other Ambulatory Visit: Payer: Self-pay | Admitting: *Deleted

## 2019-08-21 MED ORDER — METFORMIN HCL 500 MG PO TABS: 500.0000 mg | ORAL_TABLET | Freq: Two times a day (BID) | ORAL | 1 refills | Status: DC

## 2019-08-21 MED FILL — metFORMIN HCL 500 MG TABS: 500 | 90 days supply | Qty: 180 | Fill #0

## 2019-08-21 NOTE — Telephone Encounter (Signed)
LVM for pt to call the office.

## 2019-08-21 NOTE — Telephone Encounter (Signed)
PT advised of recommendations with verbal understanding 500mg  sent in

## 2019-09-15 ENCOUNTER — Other Ambulatory Visit: Payer: Self-pay

## 2019-09-15 ENCOUNTER — Ambulatory Visit (INDEPENDENT_AMBULATORY_CARE_PROVIDER_SITE_OTHER): Payer: 59 | Admitting: Orthopedic Surgery

## 2019-09-15 ENCOUNTER — Encounter: Payer: Self-pay | Admitting: Orthopedic Surgery

## 2019-09-15 ENCOUNTER — Ambulatory Visit: Payer: 59

## 2019-09-15 VITALS — Ht 62.0 in | Wt 298.0 lb

## 2019-09-15 DIAGNOSIS — M23321 Other meniscus derangements, posterior horn of medial meniscus, right knee: Secondary | ICD-10-CM

## 2019-09-15 DIAGNOSIS — Z6841 Body Mass Index (BMI) 40.0 and over, adult: Secondary | ICD-10-CM

## 2019-09-15 DIAGNOSIS — M25561 Pain in right knee: Secondary | ICD-10-CM

## 2019-09-15 NOTE — Patient Instructions (Signed)
Meniscus Tear    A meniscus tear is a knee injury that happens when a piece of the meniscus is torn. The meniscus is a thick, rubbery, wedge-shaped cartilage in the knee. Two menisci are located in each knee. They sit between the upper bone (femur) and lower bone (tibia) that make up the knee joint. Each meniscus acts as a shock absorber for the knee.  A torn meniscus is one of the most common types of knee injuries. This injury can range from mild to severe. Surgery may be needed to repair a severe tear.  What are the causes?  This condition may be caused by any kneeling, squatting, twisting, or pivoting movement. Sports-related injuries are the most common cause. These often occur from:  · Running and stopping suddenly.  ? Changing direction.  ? Being tackled or knocked off your feet.  · Lifting or carrying heavy weights.  As people get older, their menisci get thinner and weaker. In these people, tears can happen more easily, such as from climbing stairs.  What increases the risk?  You are more likely to develop this condition if you:  · Play contact sports.  · Have a job that requires kneeling or squatting.  · Are female.  · Are over 40 years old.  What are the signs or symptoms?  Symptoms of this condition include:  · Knee pain, especially at the side of the knee joint. You may feel pain when the injury occurs, or you may only hear a pop and feel pain later.  · A feeling that your knee is clicking, catching, locking, or giving way.  · Not being able to fully bend or extend your knee.  · Bruising or swelling in your knee.  How is this diagnosed?  This condition may be diagnosed based on your symptoms and a physical exam.  You may also have tests, such as:  · X-rays.  · MRI.  · A procedure to look inside your knee with a narrow surgical telescope (arthroscopy).  You may be referred to a knee specialist (orthopedic surgeon).  How is this treated?  Treatment for this injury depends on the severity of the tear.  Treatment for a mild tear may include:  · Rest.  · Medicine to reduce pain and swelling. This is usually a nonsteroidal anti-inflammatory drug (NSAID), like ibuprofen.  · A knee brace, sleeve, or wrap.  · Using crutches or a walker to keep weight off your knee and to help you walk.  · Exercises to strengthen your knee (physical therapy).  You may need surgery if you have a severe tear or if other treatments are not working.  Follow these instructions at home:  If you have a brace, sleeve, or wrap:  · Wear it as told by your health care provider. Remove it only as told by your health care provider.  · Loosen the brace, sleeve, or wrap if your toes tingle, become numb, or turn cold and blue.  · Keep the brace, sleeve, or wrap clean and dry.  · If the brace, sleeve, or wrap is not waterproof:  ? Do not let it get wet.  ? Cover it with a watertight covering when you take a bath or shower.  Managing pain and swelling    · Take over-the-counter and prescription medicines only as told by your health care provider.  · If directed, put ice on your knee:  ? If you have a removable brace, sleeve, or wrap, remove it   elevate) the injured area above the level of your heart while you are sitting or lying down. Activity  Do not use the injured limb to support your body weight until your health care provider says that you can. Use crutches or a walker as told by your health care provider.  Return to your normal activities as told by your health care provider. Ask your health care provider what activities are safe for you.  Perform range-of-motion exercises only as told by your health care provider.  Begin doing exercises to strengthen your knee and leg muscles only as told by your  health care provider. After you recover, your health care provider may recommend these exercises to help prevent another injury. General instructions  Use a knee brace, sleeve, or wrap as told by your health care provider.  Ask your health care provider when it is safe to drive if you have a brace, sleeve, or wrap on your knee.  Do not use any products that contain nicotine or tobacco, such as cigarettes, e-cigarettes, and chewing tobacco. If you need help quitting, ask your health care provider.  Ask your health care provider if the medicine prescribed to you: ? Requires you to avoid driving or using heavy machinery. ? Can cause constipation. You may need to take these actions to prevent or treat constipation:  Drink enough fluid to keep your urine pale yellow.  Take over-the-counter or prescription medicines.  Eat foods that are high in fiber, such as beans, whole grains, and fresh fruits and vegetables.  Limit foods that are high in fat and processed sugars, such as fried or sweet foods.  Keep all follow-up visits as told by your health care provider. This is important. Contact a health care provider if:  You have a fever.  Your knee becomes red, tender, or swollen.  Your pain medicine is not helping.  Your symptoms get worse or do not improve after 2 weeks of home care. Summary  A meniscus tear is a knee injury that happens when a piece of the meniscus is torn.  Treatment for this injury depends on the severity of the tear. You may need surgery if you have a severe tear or if other treatments are not working.  Rest, ice, and raise (elevate) your injured knee as told by your health care provider. This will help lessen pain and swelling.  Contact a health care provider if you have new symptoms, or your symptoms get worse or do not improve after 2 weeks of home care.  Keep all follow-up visits as told by your health care provider. This is important. This information is not  intended to replace advice given to you by your health care provider. Make sure you discuss any questions you have with your health care provider. Document Revised: 10/22/2017 Document Reviewed: 10/22/2017 Elsevier Patient Education  2020 [employer]  

## 2019-09-15 NOTE — Progress Notes (Signed)
Chief Complaint  Patient presents with  . Knee Pain    Right knee pain, no injury.    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  Had some difficulties with the right knee back in January took some prednisone seem to help a little but this feels different.  Range of motion remains okay but patient says she has to keep it straight at night  Review of systems heartburn pollen allergies otherwise negative  Past Medical History:  Diagnosis Date  . Allergy   . Anemia   . Arthritis   . Diabetes mellitus    type II   . GERD (gastroesophageal reflux disease)   . PONV (postoperative nausea and vomiting)     Ht 5\' 2"  (1.575 m)   Wt 298 lb (135.2 kg)   BMI 54.50 kg/m   The patient meets the AMA guidelines for Morbid (severe) obesity with a BMI > 40.0 and I have recommended weight loss.  Physical Exam   She is awake alert and oriented x3 mood and affect normal she walks without this devices  Right knee  Medial joint line tenderness painful extension painful flexion positive McMurray's sign positive screw home test joint effusion  Ligaments are stable anterior posterior and collateral  Skin is normal  Neurovascular exam intact  Encounter Diagnoses  Name Primary?  . Acute pain of right knee Yes  . Body mass index 50.0-59.9, adult ([place])   . Morbid obesity ([place])   . Derangement of posterior horn of medial meniscus of right knee     Recommend three times a day ibuprofen ice  MRI right knee looks like a meniscal tear  Probable surgery.

## 2019-09-17 DIAGNOSIS — H5213 Myopia, bilateral: Secondary | ICD-10-CM | POA: Diagnosis not present

## 2019-09-25 LAB — HM DIABETES EYE EXAM

## 2019-09-29 ENCOUNTER — Ambulatory Visit (HOSPITAL_COMMUNITY)
Admission: RE | Admit: 2019-09-29 | Discharge: 2019-09-29 | Disposition: A | Discharge status: Home / Self Care | Payer: 59 | Source: Ambulatory Visit | Attending: Orthopedic Surgery | Admitting: Orthopedic Surgery

## 2019-09-29 ENCOUNTER — Other Ambulatory Visit: Payer: Self-pay

## 2019-09-29 DIAGNOSIS — M25561 Pain in right knee: Secondary | ICD-10-CM | POA: Diagnosis not present

## 2019-10-07 ENCOUNTER — Other Ambulatory Visit: Payer: Self-pay

## 2019-10-07 ENCOUNTER — Ambulatory Visit (INDEPENDENT_AMBULATORY_CARE_PROVIDER_SITE_OTHER): Payer: 59 | Admitting: Orthopedic Surgery

## 2019-10-07 ENCOUNTER — Encounter: Payer: Self-pay | Admitting: Orthopedic Surgery

## 2019-10-07 VITALS — BP 128/71 | HR 67 | Ht 62.0 in | Wt 300.0 lb

## 2019-10-07 DIAGNOSIS — G8929 Other chronic pain: Secondary | ICD-10-CM | POA: Diagnosis not present

## 2019-10-07 DIAGNOSIS — M23321 Other meniscus derangements, posterior horn of medial meniscus, right knee: Secondary | ICD-10-CM

## 2019-10-07 NOTE — Patient Instructions (Signed)
You have received an injection of steroids into the joint. 15% of patients will have increased pain within the 24 hours postinjection.   This is transient and will go away.   We recommend that you use ice packs on the injection site for 20 minutes every 2 hours and extra strength Tylenol 2 tablets every 8 as needed until the pain resolves.  If you continue to have pain after taking the Tylenol and using the ice please call the office for further instructions.   Take your ibuprofen or naprosyn   Knee exercises for 6 weeks   Maintain a good weight

## 2019-10-07 NOTE — Progress Notes (Signed)
Chief Complaint  Patient presents with  . Results    right knee MRI  . 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  MRI was read and I have reviewed independently and agree that she has mild arthritis medial lateral compartments degenerative meniscal tear medial compartment  Discussed with her possible treatment options including surgery but recommend continued NSAID therapy, and home exercises for knee strengthening and injection  She is agreeable  We injected the right knee  Procedure note right knee injection   verbal consent was obtained to inject right knee joint  Timeout was completed to confirm the site of injection  The medications used were 40 mg of Depo-Medrol and 1% lidocaine 3 cc  Anesthesia was provided by ethyl chloride and the skin was prepped with alcohol.  After cleaning the skin with alcohol a 20-gauge needle was used to inject the right knee joint. There were no complications. A sterile bandage was applied.   She will start home exercise program continue her NSAID therapy and come back to see [place] in 6 weeks

## 2019-10-19 ENCOUNTER — Ambulatory Visit: Payer: 59 | Admitting: Family Medicine

## 2019-10-27 ENCOUNTER — Ambulatory Visit: Payer: 59 | Admitting: Family Medicine

## 2019-11-03 MED FILL — PANTOPRAZOLE SOD DR 40 MG T: 40 | 90 days supply | Qty: 90 | Fill #1

## 2019-11-16 ENCOUNTER — Other Ambulatory Visit: Payer: Self-pay | Admitting: Family Medicine

## 2019-11-16 MED FILL — METFORMIN HCL 500 MG TABS: 500 | 90 days supply | Qty: 180 | Fill #0

## 2019-11-18 ENCOUNTER — Other Ambulatory Visit: Payer: Self-pay

## 2019-11-18 ENCOUNTER — Encounter: Payer: Self-pay | Admitting: Orthopedic Surgery

## 2019-11-18 ENCOUNTER — Encounter: Payer: Self-pay | Admitting: Family Medicine

## 2019-11-18 ENCOUNTER — Ambulatory Visit: Payer: 59 | Admitting: Family Medicine

## 2019-11-18 ENCOUNTER — Ambulatory Visit: Payer: 59 | Admitting: Orthopedic Surgery

## 2019-11-18 VITALS — BP 108/65 | HR 61 | Ht 62.0 in | Wt 300.0 lb

## 2019-11-18 VITALS — BP 112/73 | HR 62 | Resp 16 | Ht 62.0 in | Wt 306.0 lb

## 2019-11-18 DIAGNOSIS — Z6841 Body Mass Index (BMI) 40.0 and over, adult: Secondary | ICD-10-CM

## 2019-11-18 DIAGNOSIS — H60391 Other infective otitis externa, right ear: Secondary | ICD-10-CM | POA: Diagnosis not present

## 2019-11-18 DIAGNOSIS — E1169 Type 2 diabetes mellitus with other specified complication: Secondary | ICD-10-CM | POA: Diagnosis not present

## 2019-11-18 DIAGNOSIS — D509 Iron deficiency anemia, unspecified: Secondary | ICD-10-CM

## 2019-11-18 DIAGNOSIS — G8929 Other chronic pain: Secondary | ICD-10-CM | POA: Diagnosis not present

## 2019-11-18 DIAGNOSIS — E559 Vitamin D deficiency, unspecified: Secondary | ICD-10-CM | POA: Diagnosis not present

## 2019-11-18 DIAGNOSIS — Z1159 Encounter for screening for other viral diseases: Secondary | ICD-10-CM

## 2019-11-18 DIAGNOSIS — J309 Allergic rhinitis, unspecified: Secondary | ICD-10-CM

## 2019-11-18 DIAGNOSIS — E785 Hyperlipidemia, unspecified: Secondary | ICD-10-CM

## 2019-11-18 DIAGNOSIS — M25561 Pain in right knee: Secondary | ICD-10-CM

## 2019-11-18 DIAGNOSIS — E669 Obesity, unspecified: Secondary | ICD-10-CM

## 2019-11-18 DIAGNOSIS — M23321 Other meniscus derangements, posterior horn of medial meniscus, right knee: Secondary | ICD-10-CM | POA: Diagnosis not present

## 2019-11-18 DIAGNOSIS — Z1231 Encounter for screening mammogram for malignant neoplasm of breast: Secondary | ICD-10-CM | POA: Diagnosis not present

## 2019-11-18 LAB — POCT GLYCOSYLATED HEMOGLOBIN (HGB A1C)
HbA1c POC (<> result, manual entry): 6.5 % (ref 4.0–5.6)
HbA1c, POC (controlled diabetic range): 6.5 % (ref 0.0–7.0)
HbA1c, POC (prediabetic range): 6.5 % — AB (ref 5.7–6.4)
Hemoglobin A1C: 6.5 % — AB (ref 4.0–5.6)

## 2019-11-18 MED ORDER — BLOOD GLUCOSE METER KIT: PACK | 0 refills | Status: DC

## 2019-11-18 MED ORDER — SULFAMETHOXAZOLE-TRIMETHOPRIM 800-160 MG PO TABS
1.0000 | ORAL_TABLET | Freq: Two times a day (BID) | ORAL | 0 refills | Status: DC
Start: 2019-11-18 — End: 2020-06-23

## 2019-11-18 MED FILL — FREESTYLE LITE TEST STRIP: 50 days supply | Qty: 50 | Fill #0

## 2019-11-18 MED FILL — SULFAMETHOXAZOLE-TMP DS TAB: 800-160 | 7 days supply | Qty: 14 | Fill #0

## 2019-11-18 MED FILL — FREESTYLE LANCETS: 90 days supply | Qty: 100 | Fill #0

## 2019-11-18 MED FILL — FREESTYLE FREEDOM LITE METE: W/DEVICE | 30 days supply | Qty: 1 | Fill #0

## 2019-11-18 NOTE — Progress Notes (Signed)
TIAHNA CURE  11/18/2019  Body mass index is 54.87 kg/m.   S:  Chief Complaint  Patient presents with  . Knee Pain    right / fell on it last week made a little worse/ has burning    44 year old female with obesity and torn meniscus in osteoarthritis of the right knee diagnosed previously.  She is already had an MRI.  We decided to go with nonoperative treatment  She was doing reasonably well until [place] at the beach she complains of burning pain on the inside part of the knee which is getting better    O: BP 108/65   Pulse 61   Ht 5\' 2"  (1.575 m)   Wt (!) 300 lb (136.1 kg)   BMI 54.87 kg/m  The patient meets the AMA guidelines for Morbid (severe) obesity with a BMI > 40.0 and I have recommended weight loss.  Physical Exam  Patient still maintains a good range of motion knee is stable she has tenderness on the medial side of the joint no effusion   MEDICAL DECISION MAKING  Encounter Diagnoses  Name Primary?  . Derangement of posterior horn of medial meniscus of right knee Yes  . Chronic pain of right knee   . Body mass index 50.0-59.9, adult ([place])   . Morbid obesity ([place])      MANAGEMENT   I think we can manage this with ibuprofen rest activity modification  We will see her in 2 months    No orders of the defined types were placed in this encounter.     Arther Abbott, MD  11/18/2019 12:26 PM

## 2019-11-18 NOTE — Patient Instructions (Addendum)
Follow-up in office with MD in 3-1/2 months call if you need me sooner.  Mammogram to be scheduled at checkout Antibiotic , septra , is prescribed for the right ear which is infected.  Otherwise there are no new changes in your medications.  Congratulations on excellent blood sugar control.  Please commits to change in food choices and reducing portion size so that we have a 2 pound weight loss each month for the next several months.  Labs today CBC CMP and EGFR iron ferritin hepatitis C screen and lipid profile and vit d  It is important that you exercise regularly at least 30 minutes 5 times a week. If you develop chest pain, have severe difficulty breathing, or feel very tired, stop exercising immediately and seek medical attention  Think about what you will eat, plan ahead. Choose " clean, green, fresh or frozen" over canned, processed or packaged foods which are more sugary, salty and fatty. 70 to 75% of food eaten should be vegetables and fruit. Three meals at set times with snacks allowed between meals, but they must be fruit or vegetables. Aim to eat over a 12 hour period , example 7 am to 7 pm, and STOP after  your last meal of the day. Drink water,generally about 64 ounces per day, no other drink is as healthy. Fruit juice is best enjoyed in a healthy way, by EATING the fruit.   Thanks for choosing [facility], we consider it a privelige to serve you.  

## 2019-11-19 LAB — IRON: Iron: 58 ug/dL (ref 27–159)

## 2019-11-19 LAB — LIPID PANEL
Chol/HDL Ratio: 2.7 ratio (ref 0.0–4.4)
Cholesterol, Total: 167 mg/dL (ref 100–199)
HDL: 63 mg/dL (ref >39)
LDL Chol Calc (NIH): 94 mg/dL (ref 0–99)
Triglycerides: 47 mg/dL (ref 0–149)
VLDL Cholesterol Cal: 10 mg/dL (ref 5–40)

## 2019-11-19 LAB — CMP14+EGFR
ALT: 8 IU/L (ref 0–32)
AST: 10 IU/L (ref 0–40)
Albumin/Globulin Ratio: 1.6 (ref 1.2–2.2)
Albumin: 4.2 g/dL (ref 3.8–4.8)
Alkaline Phosphatase: 69 IU/L (ref 48–121)
BUN/Creatinine Ratio: 25 — ABNORMAL HIGH (ref 9–23)
BUN: 15 mg/dL (ref 6–24)
Bilirubin Total: 0.4 mg/dL (ref 0.0–1.2)
CO2: 21 mmol/L (ref 20–29)
Calcium: 9.3 mg/dL (ref 8.7–10.2)
Chloride: 103 mmol/L (ref 96–106)
Creatinine, Ser: 0.6 mg/dL (ref 0.57–1.00)
GFR calc Af Amer: 126 mL/min/{1.73_m2} (ref >59)
GFR calc non Af Amer: 110 mL/min/{1.73_m2} (ref >59)
Globulin, Total: 2.6 g/dL (ref 1.5–4.5)
Glucose: 122 mg/dL — ABNORMAL HIGH (ref 65–99)
Potassium: 4.2 mmol/L (ref 3.5–5.2)
Sodium: 139 mmol/L (ref 134–144)
Total Protein: 6.8 g/dL (ref 6.0–8.5)

## 2019-11-19 LAB — FERRITIN: Ferritin: 42 ng/mL (ref 15–150)

## 2019-11-19 LAB — CBC
Hematocrit: 36.4 % (ref 34.0–46.6)
Hemoglobin: 11.3 g/dL (ref 11.1–15.9)
MCH: 27.4 pg (ref 26.6–33.0)
MCHC: 31 g/dL — ABNORMAL LOW (ref 31.5–35.7)
MCV: 88 fL (ref 79–97)
Platelets: 232 10*3/uL (ref 150–450)
RBC: 4.13 x10E6/uL (ref 3.77–5.28)
RDW: 13 % (ref 11.7–15.4)
WBC: 5.1 10*3/uL (ref 3.4–10.8)

## 2019-11-19 LAB — HEPATITIS C ANTIBODY: Hep C Virus Ab: <0.1 s/co ratio (ref 0.0–0.9)

## 2019-11-19 LAB — VITAMIN D 25 HYDROXY (VIT D DEFICIENCY, FRACTURES): Vit D, 25-Hydroxy: 13.9 ng/mL — ABNORMAL LOW (ref 30.0–100.0)

## 2019-11-21 ENCOUNTER — Encounter: Payer: Self-pay | Admitting: Family Medicine

## 2019-11-21 NOTE — Assessment & Plan Note (Signed)
Controlled, no change in medication  

## 2019-11-21 NOTE — Assessment & Plan Note (Signed)
Managed by Ortho, weight loss encouraged

## 2019-11-21 NOTE — Assessment & Plan Note (Signed)
Needs to commit to once weekly vit D

## 2019-11-21 NOTE — Assessment & Plan Note (Signed)
10 day course of septra prescribed

## 2019-11-21 NOTE — Progress Notes (Signed)
[name]     MRN: [id number]      DOB: 14-Mar-1976   HPI [name] is here for follow up and re-evaluation of chronic medical conditions, medication management and review of any available recent lab and radiology data.  Preventive health is updated, specifically  Cancer screening and Immunization.   Questions or concerns regarding consultations or procedures which the PT has had in the interim are  addressed. The PT denies any adverse reactions to current medications since the last visit.  There are no new concerns.  Still challenged with weight loss, not consistent in behavioral changes C/O right knee pain and swelling has Ortho appt today Denies polyuria, polydipsia, blurred vision , or hypoglycemic episodes.    ROS Denies recent fever or chills. Denies sinus pressure, nasal congestion or sore throat.c/o right ear pain Denies chest congestion, productive cough or wheezing. Denies chest pains, palpitations and leg swelling Denies abdominal pain, nausea, vomiting,diarrhea or constipation.   Denies dysuria, frequency, hesitancy or incontinence. . Denies depression, anxiety or insomnia. Denies skin break down or rash.   PE  BP 112/73   Pulse 62   Resp 16   Ht 5\' 2"  (1.575 m)   Wt (!) 306 lb (138.8 kg)   SpO2 98%   BMI 55.97 kg/m   Patient alert and oriented and in no cardiopulmonary distress.  HEENT: No facial asymmetry, EOMI,     Neck supple .Right tM erythematous and poor light reflex  Chest: Clear to auscultation bilaterally.  CVS: S1, S2 no murmurs, no S3.Regular rate.  ABD: Soft non tender.   Ext: No edema  MS: Adequate ROM spine, shoulders, hips and reduced in  knees.  Skin: Intact, no ulcerations or rash noted.  Psych: Good eye contact, normal affect. Memory intact not anxious or depressed appearing.  CNS: CN 2-12 intact, power,  normal throughout.no focal deficits noted.   Assessment & Plan  Diabetes mellitus type 2 in obese [name] is  reminded of the importance of commitment to daily physical activity for 30 minutes or more, as able and the need to limit carbohydrate intake to 30 to 60 grams per meal to help with blood sugar control.   The need to take medication as prescribed, test blood sugar as directed, and to call between visits if there is a concern that blood sugar is uncontrolled is also discussed.   [name] is reminded of the importance of daily foot exam, annual eye examination, and good blood sugar, blood pressure and cholesterol control. Controlled, no change in medication  Diabetic Labs Latest Ref Rng & Units 11/18/2019 11/18/2019 11/18/2019 11/18/2019 05/18/2019  HbA1c 4.0 - 5.6 % 6.5(A) 6.5 6.5 6.5(A) 6.2(H)  Microalbumin mg/dL - - - - -  Micro/Creat Ratio 0.0 - 30.0 mg/g creat - - - - -  Chol 100 - 199 mg/dL 167 - - - 170  HDL >39 mg/dL 63 - - - 55  Calc LDL 0 - 99 mg/dL 94 - - - 101(H)  Triglycerides 0 - 149 mg/dL 47 - - - 47  Creatinine 0.57 - 1.00 mg/dL 0.60 - - - 0.59   BP/Weight 11/18/2019 11/18/2019 10/07/2019 09/15/2019 05/20/2019 04/20/2019 27/25/3664  Systolic BP 403 474 259 - 563 875 643  Diastolic BP 65 73 71 - 71 71 82  Wt. (Lbs) 300 306 300 298 292 289 298  BMI 54.87 55.97 54.87 54.5 53.41 52.86 54.5   Foot/eye exam completion dates Latest Ref Rng & Units 09/25/2019  01/19/2019  Eye Exam No Retinopathy No Retinopathy -  Foot Form Completion - - Done        Morbid obesity ([place])  Patient re-educated about  the importance of commitment to a  minimum of 150 minutes of exercise per week as able.  The importance of healthy food choices with portion control discussed, as well as eating regularly and within a 12 hour window most days. The need to choose "clean , green" food 50 to 75% of the time is discussed, as well as to make water the primary drink and set a goal of 64 ounces water daily.    Weight /BMI 11/18/2019 11/18/2019 10/07/2019  WEIGHT 300 lb 306 lb 300 lb  HEIGHT 5\' 2"  5\' 2"  5\' 2"   BMI  54.87 kg/m2 55.97 kg/m2 54.87 kg/m2      Otitis externa 10 day course of septra prescribed  Knee pain, right Managed by Ortho, weight loss encouraged  Allergic rhinitis Controlled, no change in medication   Vitamin D deficiency Needs to commit to once weekly vit D

## 2019-11-21 NOTE — Assessment & Plan Note (Signed)
[name] is reminded of the importance of commitment to daily physical activity for 30 minutes or more, as able and the need to limit carbohydrate intake to 30 to 60 grams per meal to help with blood sugar control.   The need to take medication as prescribed, test blood sugar as directed, and to call between visits if there is a concern that blood sugar is uncontrolled is also discussed.   [name] is reminded of the importance of daily foot exam, annual eye examination, and good blood sugar, blood pressure and cholesterol control. Controlled, no change in medication  Diabetic Labs Latest Ref Rng & Units 11/18/2019 11/18/2019 11/18/2019 11/18/2019 05/18/2019  HbA1c 4.0 - 5.6 % 6.5(A) 6.5 6.5 6.5(A) 6.2(H)  Microalbumin mg/dL - - - - -  Micro/Creat Ratio 0.0 - 30.0 mg/g creat - - - - -  Chol 100 - 199 mg/dL 167 - - - 170  HDL >39 mg/dL 63 - - - 55  Calc LDL 0 - 99 mg/dL 94 - - - 101(H)  Triglycerides 0 - 149 mg/dL 47 - - - 47  Creatinine 0.57 - 1.00 mg/dL 0.60 - - - 0.59   BP/Weight 11/18/2019 11/18/2019 10/07/2019 09/15/2019 05/20/2019 04/20/2019 32/35/5732  Systolic BP 202 542 706 - 237 628 315  Diastolic BP 65 73 71 - 71 71 82  Wt. (Lbs) 300 306 300 298 292 289 298  BMI 54.87 55.97 54.87 54.5 53.41 52.86 54.5   Foot/eye exam completion dates Latest Ref Rng & Units 09/25/2019 01/19/2019  Eye Exam No Retinopathy No Retinopathy -  Foot Form Completion - - Done

## 2019-11-21 NOTE — Assessment & Plan Note (Signed)
  Patient re-educated about  the importance of commitment to a  minimum of 150 minutes of exercise per week as able.  The importance of healthy food choices with portion control discussed, as well as eating regularly and within a 12 hour window most days. The need to choose "clean , green" food 50 to 75% of the time is discussed, as well as to make water the primary drink and set a goal of 64 ounces water daily.    Weight /BMI 11/18/2019 11/18/2019 10/07/2019  WEIGHT 300 lb 306 lb 300 lb  HEIGHT 5\' 2"  5\' 2"  5\' 2"   BMI 54.87 kg/m2 55.97 kg/m2 54.87 kg/m2

## 2020-01-18 ENCOUNTER — Other Ambulatory Visit: Payer: Self-pay | Admitting: Family Medicine

## 2020-01-18 MED FILL — glipiZIDE XL 5 MG TB24: 5 | 90 days supply | Qty: 90 | Fill #0

## 2020-01-20 ENCOUNTER — Encounter: Payer: Self-pay | Admitting: Orthopedic Surgery

## 2020-01-20 ENCOUNTER — Ambulatory Visit: Payer: 59 | Admitting: Orthopedic Surgery

## 2020-01-20 ENCOUNTER — Other Ambulatory Visit: Payer: Self-pay

## 2020-01-20 VITALS — BP 117/75 | HR 71 | Ht 62.0 in | Wt 306.0 lb

## 2020-01-20 DIAGNOSIS — M23321 Other meniscus derangements, posterior horn of medial meniscus, right knee: Secondary | ICD-10-CM

## 2020-01-20 DIAGNOSIS — G8929 Other chronic pain: Secondary | ICD-10-CM

## 2020-01-20 DIAGNOSIS — Z6841 Body Mass Index (BMI) 40.0 and over, adult: Secondary | ICD-10-CM | POA: Diagnosis not present

## 2020-01-20 NOTE — Progress Notes (Signed)
Chief Complaint  Patient presents with  . Knee Pain    right knee pain, 2/10 today for pain, no falls   44 year old female has a meniscal tear of the right knee  Complains of intermittent severe pain primarily has mild pain and pain after walking at work for 12 hours  She takes pain medication intermittently with some ibuprofen and that seems to help  Exam of the right knee shows full if not hyperextension full flexion for her some medial pain to palpation no instability normal strength in extension   Takes motrin only if she has a lot of pain   MRI from June IMPRESSION: 1. Probable degenerative tear peripherally in the body of the medial meniscus. 2. The lateral meniscus, cruciate and collateral ligaments are intact. 3. Mild medial and lateral compartment degenerative changes. No acute osseous findings.     Electronically Signed   By: Richardean Sale M.D.   On: 09/30/2019 09:58   Encounter Diagnoses  Name Primary?  . Derangement of posterior horn of medial meniscus of right knee Yes  . Chronic pain of right knee   . Morbid obesity ([place])   . Body mass index 50.0-59.9, adult [facility])     Did not recommend surgery at this time weight loss exercises continue use Motrin before  Fu end of the year   The patient meets the AMA guidelines for Morbid (severe) obesity with a BMI > 40.0 and I have recommended weight loss.

## 2020-01-22 ENCOUNTER — Ambulatory Visit
Admission: RE | Admit: 2020-01-22 | Discharge: 2020-01-22 | Disposition: A | Discharge status: Home / Self Care | Payer: 59 | Source: Ambulatory Visit | Attending: Family Medicine | Admitting: Family Medicine

## 2020-01-22 ENCOUNTER — Other Ambulatory Visit: Payer: Self-pay

## 2020-01-22 DIAGNOSIS — Z1231 Encounter for screening mammogram for malignant neoplasm of breast: Secondary | ICD-10-CM | POA: Diagnosis not present

## 2020-02-16 MED FILL — PANTOPRAZOLE SOD DR 40 MG T: 40 | 90 days supply | Qty: 90 | Fill #0

## 2020-02-18 ENCOUNTER — Ambulatory Visit: Payer: 59 | Admitting: Family Medicine

## 2020-02-22 MED FILL — METFORMIN HCL 500 MG TABS: 500 | 90 days supply | Qty: 180 | Fill #1

## 2020-04-06 ENCOUNTER — Ambulatory Visit: Payer: 59 | Admitting: Orthopedic Surgery

## 2020-04-19 MED FILL — glipiZIDE XL 5 MG TB24: 5 | 90 days supply | Qty: 90 | Fill #1

## 2020-04-25 ENCOUNTER — Encounter: Payer: Self-pay | Admitting: Orthopedic Surgery

## 2020-04-25 ENCOUNTER — Ambulatory Visit: Payer: 59 | Admitting: Orthopedic Surgery

## 2020-04-25 ENCOUNTER — Other Ambulatory Visit: Payer: Self-pay

## 2020-04-25 VITALS — Ht 62.0 in | Wt 300.0 lb

## 2020-04-25 DIAGNOSIS — M23321 Other meniscus derangements, posterior horn of medial meniscus, right knee: Secondary | ICD-10-CM

## 2020-04-25 DIAGNOSIS — Z6841 Body Mass Index (BMI) 40.0 and over, adult: Secondary | ICD-10-CM

## 2020-04-25 DIAGNOSIS — G8929 Other chronic pain: Secondary | ICD-10-CM | POA: Diagnosis not present

## 2020-04-25 DIAGNOSIS — M7051 Other bursitis of knee, right knee: Secondary | ICD-10-CM

## 2020-04-25 NOTE — Patient Instructions (Signed)
Take your medication before you go to work

## 2020-04-25 NOTE — Progress Notes (Signed)
Chief Complaint  Patient presents with  . 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  Right knee exam Tenderness over the medial pes tendons Mild tenderness over the medial joint line Excellent strength Functional range of motion No instability  Recommend Motrin before the shift Follow-up 6 months  Encounter Diagnoses  Name Primary?  . Chronic pain of right knee Yes  . Derangement of posterior horn of medial meniscus of right knee   . Morbid obesity ([facility])   . Body mass index 50.0-59.9, adult ([facility])   . Pes anserinus bursitis of right knee

## 2020-05-16 MED FILL — PANTOPRAZOLE SOD DR 40 MG T: 40 | 90 days supply | Qty: 90 | Fill #1

## 2020-05-23 ENCOUNTER — Other Ambulatory Visit: Payer: Self-pay | Admitting: Family Medicine

## 2020-05-23 MED FILL — METFORMIN HCL 500 MG TABS: 500 | 90 days supply | Qty: 180 | Fill #0

## 2020-06-08 ENCOUNTER — Other Ambulatory Visit: Payer: Self-pay

## 2020-06-08 DIAGNOSIS — E1169 Type 2 diabetes mellitus with other specified complication: Secondary | ICD-10-CM

## 2020-06-08 DIAGNOSIS — E785 Hyperlipidemia, unspecified: Secondary | ICD-10-CM

## 2020-06-08 DIAGNOSIS — E669 Obesity, unspecified: Secondary | ICD-10-CM

## 2020-06-08 DIAGNOSIS — E559 Vitamin D deficiency, unspecified: Secondary | ICD-10-CM

## 2020-06-09 DIAGNOSIS — E559 Vitamin D deficiency, unspecified: Secondary | ICD-10-CM | POA: Diagnosis not present

## 2020-06-09 DIAGNOSIS — E785 Hyperlipidemia, unspecified: Secondary | ICD-10-CM | POA: Diagnosis not present

## 2020-06-09 DIAGNOSIS — E669 Obesity, unspecified: Secondary | ICD-10-CM | POA: Diagnosis not present

## 2020-06-09 DIAGNOSIS — E1169 Type 2 diabetes mellitus with other specified complication: Secondary | ICD-10-CM | POA: Diagnosis not present

## 2020-06-11 LAB — MICROALBUMIN / CREATININE URINE RATIO
Creatinine, Urine: 192.8 mg/dL
Microalb/Creat Ratio: 17 mg/g creat (ref 0–29)
Microalbumin, Urine: 33 ug/mL

## 2020-06-11 LAB — CMP14+EGFR
ALT: 9 IU/L (ref 0–32)
AST: 13 IU/L (ref 0–40)
Albumin/Globulin Ratio: 1.4 (ref 1.2–2.2)
Albumin: 4.2 g/dL (ref 3.8–4.8)
Alkaline Phosphatase: 87 IU/L (ref 44–121)
BUN/Creatinine Ratio: 19 (ref 9–23)
BUN: 13 mg/dL (ref 6–24)
Bilirubin Total: 0.3 mg/dL (ref 0.0–1.2)
CO2: 20 mmol/L (ref 20–29)
Calcium: 9.4 mg/dL (ref 8.7–10.2)
Chloride: 102 mmol/L (ref 96–106)
Creatinine, Ser: 0.7 mg/dL (ref 0.57–1.00)
GFR calc Af Amer: 120 mL/min/{1.73_m2} (ref >59)
GFR calc non Af Amer: 104 mL/min/{1.73_m2} (ref >59)
Globulin, Total: 3.1 g/dL (ref 1.5–4.5)
Glucose: 194 mg/dL — ABNORMAL HIGH (ref 65–99)
Potassium: 4.4 mmol/L (ref 3.5–5.2)
Sodium: 139 mmol/L (ref 134–144)
Total Protein: 7.3 g/dL (ref 6.0–8.5)

## 2020-06-11 LAB — CBC
Hematocrit: 37.8 % (ref 34.0–46.6)
Hemoglobin: 11.9 g/dL (ref 11.1–15.9)
MCH: 27.6 pg (ref 26.6–33.0)
MCHC: 31.5 g/dL (ref 31.5–35.7)
MCV: 88 fL (ref 79–97)
Platelets: 259 10*3/uL (ref 150–450)
RBC: 4.31 x10E6/uL (ref 3.77–5.28)
RDW: 12.8 % (ref 11.7–15.4)
WBC: 6 10*3/uL (ref 3.4–10.8)

## 2020-06-11 LAB — LIPID PANEL
Chol/HDL Ratio: 2.5 ratio (ref 0.0–4.4)
Cholesterol, Total: 181 mg/dL (ref 100–199)
HDL: 73 mg/dL (ref >39)
LDL Chol Calc (NIH): 96 mg/dL (ref 0–99)
Triglycerides: 62 mg/dL (ref 0–149)
VLDL Cholesterol Cal: 12 mg/dL (ref 5–40)

## 2020-06-11 LAB — HEMOGLOBIN A1C
Est. average glucose Bld gHb Est-mCnc: 209 mg/dL
Hgb A1c MFr Bld: 8.9 % — ABNORMAL HIGH (ref 4.8–5.6)

## 2020-06-11 LAB — VITAMIN D 25 HYDROXY (VIT D DEFICIENCY, FRACTURES): Vit D, 25-Hydroxy: 12.3 ng/mL — ABNORMAL LOW (ref 30.0–100.0)

## 2020-06-11 LAB — TSH: TSH: 3.84 u[IU]/mL (ref 0.450–4.500)

## 2020-06-13 ENCOUNTER — Encounter: Payer: Self-pay | Admitting: Family Medicine

## 2020-06-13 ENCOUNTER — Other Ambulatory Visit (HOSPITAL_COMMUNITY): Payer: Self-pay | Admitting: Family Medicine

## 2020-06-13 ENCOUNTER — Telehealth (INDEPENDENT_AMBULATORY_CARE_PROVIDER_SITE_OTHER): Payer: 59 | Admitting: Family Medicine

## 2020-06-13 ENCOUNTER — Other Ambulatory Visit: Payer: Self-pay

## 2020-06-13 VITALS — BP 128/72 | Ht 62.0 in | Wt 307.0 lb

## 2020-06-13 DIAGNOSIS — J309 Allergic rhinitis, unspecified: Secondary | ICD-10-CM | POA: Diagnosis not present

## 2020-06-13 DIAGNOSIS — E669 Obesity, unspecified: Secondary | ICD-10-CM

## 2020-06-13 DIAGNOSIS — E559 Vitamin D deficiency, unspecified: Secondary | ICD-10-CM | POA: Diagnosis not present

## 2020-06-13 DIAGNOSIS — E1169 Type 2 diabetes mellitus with other specified complication: Secondary | ICD-10-CM | POA: Diagnosis not present

## 2020-06-13 DIAGNOSIS — E785 Hyperlipidemia, unspecified: Secondary | ICD-10-CM

## 2020-06-13 MED ORDER — ERGOCALCIFEROL 1.25 MG (50000 UT) PO CAPS: 50000.0000 [IU] | ORAL_CAPSULE | ORAL | 2 refills | Status: DC

## 2020-06-13 MED ORDER — FLUTICASONE PROPIONATE 50 MCG/ACT NA SUSP: 2.0000 | Freq: Every day | NASAL | 1 refills | Status: DC

## 2020-06-13 MED ORDER — METFORMIN HCL 1000 MG PO TABS: 1000.0000 mg | ORAL_TABLET | Freq: Two times a day (BID) | ORAL | 3 refills | Status: DC

## 2020-06-13 MED ORDER — GLIPIZIDE ER 10 MG PO TB24: 10.0000 mg | ORAL_TABLET | Freq: Every day | ORAL | 1 refills | Status: DC

## 2020-06-13 MED FILL — METFORMIN HCL 1000 MG TABS: 1000 | 90 days supply | Qty: 180 | Fill #0

## 2020-06-13 MED FILL — glipiZIDE ER 10 MG TB24: 10 | 90 days supply | Qty: 90 | Fill #0

## 2020-06-13 MED FILL — FLUTICASONE PROP 50 MCG SPR: 50 | 90 days supply | Qty: 48 | Fill #0

## 2020-06-13 NOTE — Assessment & Plan Note (Signed)
Uncontrolled, increase glipizide to 10 mg daily, metformin to 1000 mg twice daily, weekly logs and f/u in 4 weeks [name] is reminded of the importance of commitment to daily physical activity for 30 minutes or more, as able and the need to limit carbohydrate intake to 30 to 60 grams per meal to help with blood sugar control.   The need to take medication as prescribed, test blood sugar as directed, and to call between visits if there is a concern that blood sugar is uncontrolled is also discussed.   [name] is reminded of the importance of daily foot exam, annual eye examination, and good blood sugar, blood pressure and cholesterol control.  Diabetic Labs Latest Ref Rng & Units 06/09/2020 11/18/2019 11/18/2019 11/18/2019 11/18/2019  HbA1c 4.8 - 5.6 % 8.9(H) 6.5(A) 6.5 6.5 6.5(A)  Microalbumin mg/dL - - - - -  Micro/Creat Ratio 0 - 29 mg/g creat 17 - - - -  Chol 100 - 199 mg/dL 181 167 - - -  HDL >39 mg/dL 73 63 - - -  Calc LDL 0 - 99 mg/dL 96 94 - - -  Triglycerides 0 - 149 mg/dL 62 47 - - -  Creatinine 0.57 - 1.00 mg/dL 0.70 0.60 - - -   BP/Weight 06/13/2020 04/25/2020 01/20/2020 11/18/2019 11/18/2019 10/07/2019 2/68/3419  Systolic BP 622 - 297 989 211 941 -  Diastolic BP 72 - 75 65 73 71 -  Wt. (Lbs) 307 300 306 300 306 300 298  BMI 56.15 54.87 55.97 54.87 55.97 54.87 54.5   Foot/eye exam completion dates Latest Ref Rng & Units 09/25/2019 01/19/2019  Eye Exam No Retinopathy No Retinopathy -  Foot Form Completion - - Done

## 2020-06-13 NOTE — Assessment & Plan Note (Signed)
Hyperlipidemia:Low fat diet discussed and encouraged.   Lipid Panel  Lab Results  Component Value Date   CHOL 181 06/09/2020   HDL 73 06/09/2020   LDLCALC 96 06/09/2020   TRIG 62 06/09/2020   CHOLHDL 2.5 06/09/2020

## 2020-06-13 NOTE — Progress Notes (Signed)
Virtual Visit via Video Note  I connected with [name] on 06/13/20 at  8:20 AM EST by a video enabled telemedicine application and verified that I am speaking with the correct person using two identifiers.  Location: Patient: work Provider: office   I discussed the limitations of evaluation and management by telemedicine and the availability of in person appointments. The patient expressed understanding and agreed to proceed.  History of Present Illness: F/u recent labs and uncontrolled diabetes States she has been taking meds as prescribed, not testing , drinking sweet drinks esp in past 1 month, no regular exercise Needs flonase prescribed as relies on this and is out Denies polyuria, polydipsia, blurred vision , or hypoglycemic episodes. Denies recent fever or chills. Denies sinus pressure, nasal congestion, ear pain or sore throat. Denies chest congestion, productive cough or wheezing. Denies chest pains, palpitations and leg swelling Denies abdominal pain, nausea, vomiting,diarrhea or constipation.   Denies dysuria, frequency, hesitancy or incontinence. Denies uncontrolled  joint pain, swelling and limitation in mobility. Denies headaches, seizures, numbness, or tingling. Denies depression, anxiety or insomnia. Denies skin break down or rash.       Observations/Objective: BP 128/72   Ht 5\' 2"  (1.575 m)   Wt (!) 307 lb (139.3 kg)   BMI 56.15 kg/m  Good communication with no confusion and intact memory. Alert and oriented x 3 No signs of respiratory distress during speech    Assessment and Plan:  Diabetes mellitus type 2 in obese Uncontrolled, increase glipizide to 10 mg daily, metformin to 1000 mg twice daily, weekly logs and f/u in 4 weeks [name] is reminded of the importance of commitment to daily physical activity for 30 minutes or more, as able and the need to limit carbohydrate intake to 30 to 60 grams per meal to help with blood sugar control.    The need to take medication as prescribed, test blood sugar as directed, and to call between visits if there is a concern that blood sugar is uncontrolled is also discussed.   [name] is reminded of the importance of daily foot exam, annual eye examination, and good blood sugar, blood pressure and cholesterol control.  Diabetic Labs Latest Ref Rng & Units 06/09/2020 11/18/2019 11/18/2019 11/18/2019 11/18/2019  HbA1c 4.8 - 5.6 % 8.9(H) 6.5(A) 6.5 6.5 6.5(A)  Microalbumin mg/dL - - - - -  Micro/Creat Ratio 0 - 29 mg/g creat 17 - - - -  Chol 100 - 199 mg/dL 181 167 - - -  HDL >39 mg/dL 73 63 - - -  Calc LDL 0 - 99 mg/dL 96 94 - - -  Triglycerides 0 - 149 mg/dL 62 47 - - -  Creatinine 0.57 - 1.00 mg/dL 0.70 0.60 - - -   BP/Weight 06/13/2020 04/25/2020 01/20/2020 11/18/2019 11/18/2019 10/07/2019 7/84/6962  Systolic BP 952 - 841 324 401 027 -  Diastolic BP 72 - 75 65 73 71 -  Wt. (Lbs) 307 300 306 300 306 300 298  BMI 56.15 54.87 55.97 54.87 55.97 54.87 54.5   Foot/eye exam completion dates Latest Ref Rng & Units 09/25/2019 01/19/2019  Eye Exam No Retinopathy No Retinopathy -  Foot Form Completion - - Done        Allergic rhinitis Symptomatic, flonase is prescribed  Hyperlipidemia LDL goal <100 Hyperlipidemia:Low fat diet discussed and encouraged.   Lipid Panel  Lab Results  Component Value Date   CHOL 181 06/09/2020   HDL 73 06/09/2020   LDLCALC 96  06/09/2020   TRIG 62 06/09/2020   CHOLHDL 2.5 06/09/2020       Morbid obesity ([place])  Patient re-educated about  the importance of commitment to a  minimum of 150 minutes of exercise per week as able.  The importance of healthy food choices with portion control discussed, as well as eating regularly and within a 12 hour window most days. The need to choose "clean , green" food 50 to 75% of the time is discussed, as well as to make water the primary drink and set a goal of 64 ounces water daily.    Weight /BMI 06/13/2020 04/25/2020  01/20/2020  WEIGHT 307 lb 300 lb 306 lb  HEIGHT 5\' 2"  5\' 2"  5\' 2"   BMI 56.15 kg/m2 54.87 kg/m2 55.97 kg/m2  Deteriorated    Vitamin D deficiency Untreated , needs to resume weekly vit D , same is preescribed   Follow Up Instructions:    I discussed the assessment and treatment plan with the patient. The patient was provided an opportunity to ask questions and all were answered. The patient agreed with the plan and demonstrated an understanding of the instructions.   The patient was advised to call back or seek an in-person evaluation if the symptoms worsen or if the condition fails to improve as anticipated.  I provided 22 minutes of non-face-to-face time during this encounter.   Jackie Nakayama, MD

## 2020-06-13 NOTE — Assessment & Plan Note (Signed)
  Patient re-educated about  the importance of commitment to a  minimum of 150 minutes of exercise per week as able.  The importance of healthy food choices with portion control discussed, as well as eating regularly and within a 12 hour window most days. The need to choose "clean , green" food 50 to 75% of the time is discussed, as well as to make water the primary drink and set a goal of 64 ounces water daily.    Weight /BMI 06/13/2020 04/25/2020 01/20/2020  WEIGHT 307 lb 300 lb 306 lb  HEIGHT 5\' 2"  5\' 2"  5\' 2"   BMI 56.15 kg/m2 54.87 kg/m2 55.97 kg/m2  Deteriorated

## 2020-06-13 NOTE — Assessment & Plan Note (Signed)
Untreated , needs to resume weekly vit D , same is preescribed

## 2020-06-13 NOTE — Patient Instructions (Addendum)
F/U in 1 month for review of blood sugar, with log, send message weekly with daily fasting blood sugars please  Please test TWO times daily, before breakfast and at bedtime and record   Goal for fasting blood sugar ranges from 80 to 120 and 2 hours after any meal or at bedtime should be between 130 to 170.   Please also document and commit to at least 20 minutes of exercise every day, hold yourself accountable!  NO MORE dRINKING CALORIES ? CARBS IS THE NEW BIG CHALLENGE  New is glipizide 10 mg once daily at breakfast Metformin 1000 mg two times daily  Thanks for choosing [facility], we consider it a privelige to serve you.   Diabetes Mellitus and Nutrition, Adult When you have diabetes, or diabetes mellitus, it is very important to have healthy eating habits because your blood sugar (glucose) levels are greatly affected by what you eat and drink. Eating healthy foods in the right amounts, at about the same times every day, can help you:  Control your blood glucose.  Lower your risk of heart disease.  Improve your blood pressure.  Reach or maintain a healthy weight. What can affect my meal plan? Every person with diabetes is different, and each person has different needs for a meal plan. Your health care provider may recommend that you work with a dietitian to make a meal plan that is best for you. Your meal plan may vary depending on factors such as:  The calories you need.  The medicines you take.  Your weight.  Your blood glucose, blood pressure, and cholesterol levels.  Your activity level.  Other health conditions you have, such as heart or kidney disease. How do carbohydrates affect me? Carbohydrates, also called carbs, affect your blood glucose level more than any other type of food. Eating carbs naturally raises the amount of glucose in your blood. Carb counting is a method for keeping track of how many carbs you eat. Counting carbs is important to keep  your blood glucose at a healthy level, especially if you use insulin or take certain oral diabetes medicines. It is important to know how many carbs you can safely have in each meal. This is different for every person. Your dietitian can help you calculate how many carbs you should have at each meal and for each snack. How does alcohol affect me? Alcohol can cause a sudden decrease in blood glucose (hypoglycemia), especially if you use insulin or take certain oral diabetes medicines. Hypoglycemia can be a life-threatening condition. Symptoms of hypoglycemia, such as sleepiness, dizziness, and confusion, are similar to symptoms of having too much alcohol.  Do not drink alcohol if: ? Your health care provider tells you not to drink. ? You are pregnant, may be pregnant, or are planning to become pregnant.  If you drink alcohol: ? Do not drink on an empty stomach. ? Limit how much you use to:  0-1 drink a day for women.  0-2 drinks a day for men. ? Be aware of how much alcohol is in your drink. In the [place] one drink equals one 12 oz bottle of beer (355 mL), one 5 oz glass of wine (148 mL), or one 1 oz glass of hard liquor (44 mL). ? Keep yourself hydrated with water, diet soda, or unsweetened iced tea.  Keep in mind that regular soda, juice, and other mixers may contain a lot of sugar and must be counted as carbs. What are tips for following  this plan? Reading food labels  Start by checking the serving size on the "Nutrition Facts" label of packaged foods and drinks. The amount of calories, carbs, fats, and other nutrients listed on the label is based on one serving of the item. Many items contain more than one serving per package.  Check the total grams (g) of carbs in one serving. You can calculate the number of servings of carbs in one serving by dividing the total carbs by 15. For example, if a food has 30 g of total carbs per serving, it would be equal to 2 servings of carbs.  Check the  number of grams (g) of saturated fats and trans fats in one serving. Choose foods that have a low amount or none of these fats.  Check the number of milligrams (mg) of salt (sodium) in one serving. Most people should limit total sodium intake to less than 2,300 mg per day.  Always check the nutrition information of foods labeled as "low-fat" or "nonfat." These foods may be higher in added sugar or refined carbs and should be avoided.  Talk to your dietitian to identify your daily goals for nutrients listed on the label. Shopping  Avoid buying canned, pre-made, or processed foods. These foods tend to be high in fat, sodium, and added sugar.  Shop around the outside edge of the grocery store. This is where you will most often find fresh fruits and vegetables, bulk grains, fresh meats, and fresh dairy. Cooking  Use low-heat cooking methods, such as baking, instead of high-heat cooking methods like deep frying.  Cook using healthy oils, such as olive, canola, or sunflower oil.  Avoid cooking with butter, cream, or high-fat meats. Meal planning  Eat meals and snacks regularly, preferably at the same times every day. Avoid going long periods of time without eating.  Eat foods that are high in fiber, such as fresh fruits, vegetables, beans, and whole grains. Talk with your dietitian about how many servings of carbs you can eat at each meal.  Eat 4-6 oz (112-168 g) of lean protein each day, such as lean meat, chicken, fish, eggs, or tofu. One ounce (oz) of lean protein is equal to: ? 1 oz (28 g) of meat, chicken, or fish. ? 1 egg. ?  cup (62 g) of tofu.  Eat some foods each day that contain healthy fats, such as avocado, nuts, seeds, and fish.   What foods should I eat? Fruits Berries. Apples. Oranges. Peaches. Apricots. Plums. Grapes. Mango. Papaya. Pomegranate. Kiwi. Cherries. Vegetables Lettuce. Spinach. Leafy greens, including kale, chard, collard greens, and mustard greens. Beets.  Cauliflower. Cabbage. Broccoli. Carrots. Green beans. Tomatoes. Peppers. Onions. Cucumbers. Brussels sprouts. Grains Whole grains, such as whole-wheat or whole-grain bread, crackers, tortillas, cereal, and pasta. Unsweetened oatmeal. Quinoa. Brown or wild rice. Meats and other proteins Seafood. Poultry without skin. Lean cuts of poultry and beef. Tofu. Nuts. Seeds. Dairy Low-fat or fat-free dairy products such as milk, yogurt, and cheese. The items listed above may not be a complete list of foods and beverages you can eat. Contact a dietitian for more information. What foods should I avoid? Fruits Fruits canned with syrup. Vegetables Canned vegetables. Frozen vegetables with butter or cream sauce. Grains Refined white flour and flour products such as bread, pasta, snack foods, and cereals. Avoid all processed foods. Meats and other proteins Fatty cuts of meat. Poultry with skin. Breaded or fried meats. Processed meat. Avoid saturated fats. Dairy Full-fat yogurt, cheese, or milk. Beverages  Sweetened drinks, such as soda or iced tea. The items listed above may not be a complete list of foods and beverages you should avoid. Contact a dietitian for more information. Questions to ask a health care provider  Do I need to meet with a diabetes educator?  Do I need to meet with a dietitian?  What number can I call if I have questions?  When are the best times to check my blood glucose? Where to find more information:  American Diabetes Association: diabetes.org  Academy of Nutrition and Dietetics: www.eatright.[employer] of Diabetes and Digestive and Kidney Diseases: [employer]  Association of Diabetes Care and Education Specialists: www.diabeteseducator.org Summary  It is important to have healthy eating habits because your blood sugar (glucose) levels are greatly affected by what you eat and drink.  A healthy meal plan will help you control your blood glucose  and maintain a healthy lifestyle.  Your health care provider may recommend that you work with a dietitian to make a meal plan that is best for you.  Keep in mind that carbohydrates (carbs) and alcohol have immediate effects on your blood glucose levels. It is important to count carbs and to use alcohol carefully. This information is not intended to replace advice given to you by your health care provider. Make sure you discuss any questions you have with your health care provider. Document Revised: 03/17/2019 Document Reviewed: 03/17/2019 Elsevier Patient Education  2021 [employer].  Diabetes Mellitus and Exercise Exercising regularly is important for overall health, especially for people who have diabetes mellitus. Exercising is not only about losing weight. It has many other health benefits, such as increasing muscle strength and bone density and reducing body fat and stress. This leads to improved fitness, flexibility, and endurance, all of which result in better overall health. What are the benefits of exercise if I have diabetes? Exercise has many benefits for people with diabetes. They include:  Helping to lower and control blood sugar (glucose).  Helping the body to respond better to the hormone insulin by improving insulin sensitivity.  Reducing how much insulin the body needs.  Lowering the risk for heart disease by: ? Lowering "bad" cholesterol and triglyceride levels. ? Increasing "good" cholesterol levels. ? Lowering blood pressure. ? Lowering blood glucose levels. What is my activity plan? Your health care provider or certified diabetes educator can help you make a plan for the type and frequency of exercise that works for you. This is called your activity plan. Be sure to:  Get at least 150 minutes of medium-intensity or high-intensity exercise each week. Exercises may include brisk walking, biking, or water aerobics.  Do stretching and strengthening exercises, such as  yoga or weight lifting, at least 2 times a week.  Spread out your activity over at least 3 days of the week.  Get some form of physical activity each day. ? Do not go more than 2 days in a row without some kind of physical activity. ? Avoid being inactive for more than 90 minutes at a time. Take frequent breaks to walk or stretch.  Choose exercises or activities that you enjoy. Set realistic goals.  Start slowly and gradually increase your exercise intensity over time.   How do I manage my diabetes during exercise? Monitor your blood glucose  Check your blood glucose before and after exercising. If your blood glucose is: ? 240 mg/dL (13.3 mmol/L) or higher before you exercise, check your urine for ketones. These are chemicals  created by the liver. If you have ketones in your urine, do not exercise until your blood glucose returns to normal. ? 100 mg/dL (5.6 mmol/L) or lower, eat a snack containing 15-20 grams of carbohydrate. Check your blood glucose 15 minutes after the snack to make sure that your glucose level is above 100 mg/dL (5.6 mmol/L) before you start your exercise.  Know the symptoms of low blood glucose (hypoglycemia) and how to treat it. Your risk for hypoglycemia increases during and after exercise. Follow these tips and your health care provider's instructions  Keep a carbohydrate snack that is fast-acting for use before, during, and after exercise to help prevent or treat hypoglycemia.  Avoid injecting insulin into areas of the body that are going to be exercised. For example, avoid injecting insulin into: ? Your arms, when you are about to play tennis. ? Your legs, when you are about to go jogging.  Keep records of your exercise habits. Doing this can help you and your health care provider adjust your diabetes management plan as needed. Write down: ? Food that you eat before and after you exercise. ? Blood glucose levels before and after you exercise. ? The type and  amount of exercise you have done.  Work with your health care provider when you start a new exercise or activity. He or she may need to: ? Make sure that the activity is safe for you. ? Adjust your insulin, other medicines, and food that you eat.  Drink plenty of water while you exercise. This prevents loss of water (dehydration) and problems caused by a lot of heat in the body (heat stroke).   Where to find more information  American Diabetes Association: www.diabetes.org Summary  Exercising regularly is important for overall health, especially for people who have diabetes mellitus.  Exercising has many health benefits. It increases muscle strength and bone density and reduces body fat and stress. It also lowers and controls blood glucose.  Your health care provider or certified diabetes educator can help you make an activity plan for the type and frequency of exercise that works for you.  Work with your health care provider to make sure any new activity is safe for you. Also work with your health care provider to adjust your insulin, other medicines, and the food you eat. This information is not intended to replace advice given to you by your health care provider. Make sure you discuss any questions you have with your health care provider. Document Revised: 01/05/2019 Document Reviewed: 01/05/2019 Elsevier Patient Education  [place].

## 2020-06-13 NOTE — Assessment & Plan Note (Signed)
Symptomatic, flonase is prescribed

## 2020-06-21 ENCOUNTER — Encounter: Payer: 59 | Admitting: Internal Medicine

## 2020-06-23 ENCOUNTER — Encounter: Payer: 59 | Admitting: Internal Medicine

## 2020-06-23 ENCOUNTER — Encounter: Payer: Self-pay | Admitting: Family Medicine

## 2020-06-23 ENCOUNTER — Ambulatory Visit: Payer: 59 | Admitting: Family Medicine

## 2020-06-23 ENCOUNTER — Other Ambulatory Visit (HOSPITAL_COMMUNITY): Payer: Self-pay | Admitting: Family Medicine

## 2020-06-23 ENCOUNTER — Other Ambulatory Visit: Payer: Self-pay

## 2020-06-23 VITALS — BP 122/74 | HR 79 | Temp 98.6°F | Ht 62.0 in | Wt 315.0 lb

## 2020-06-23 DIAGNOSIS — E669 Obesity, unspecified: Secondary | ICD-10-CM

## 2020-06-23 DIAGNOSIS — Z0001 Encounter for general adult medical examination with abnormal findings: Secondary | ICD-10-CM | POA: Insufficient documentation

## 2020-06-23 DIAGNOSIS — E785 Hyperlipidemia, unspecified: Secondary | ICD-10-CM | POA: Diagnosis not present

## 2020-06-23 DIAGNOSIS — K219 Gastro-esophageal reflux disease without esophagitis: Secondary | ICD-10-CM

## 2020-06-23 DIAGNOSIS — E1169 Type 2 diabetes mellitus with other specified complication: Secondary | ICD-10-CM | POA: Diagnosis not present

## 2020-06-23 DIAGNOSIS — E559 Vitamin D deficiency, unspecified: Secondary | ICD-10-CM | POA: Diagnosis not present

## 2020-06-23 MED ORDER — ERGOCALCIFEROL 1.25 MG (50000 UT) PO CAPS: 50000.0000 [IU] | ORAL_CAPSULE | ORAL | 2 refills | Status: DC

## 2020-06-23 NOTE — Assessment & Plan Note (Signed)
Continued discussion on low fat diet and exercise.  Taking crestor as directed.  Labs are stable

## 2020-06-23 NOTE — Progress Notes (Deleted)
Health Maintenance reviewed -   Immunization History  Administered Date(s) Administered  . Influenza,inj,Quad PF,6+ Mos 01/21/2020  . Moderna Sars-Covid-2 Vaccination 07/06/2019, 08/06/2019, 02/21/2020  . Pneumococcal Polysaccharide-23 07/27/2008, 09/12/2015  . Tdap 05/01/2018   Last Pap smear: Last mammogram: Last colonoscopy: Last DEXA: Dentist:  Ophtho: Exercise: Smoker: Alcohol Use:  Other doctors caring for patient include:  Patient Care Team: Fayrene Helper, MD as PCP - General (Family Medicine)  End of Life Discussion:  Patient {ACTIONS; HAS/DOES NOT LPFX:90240} a living will and medical power of attorney  Subjective:   HPI  [name] is a 45 y.o. female who presents for annual comprehensive visit and follow-up on chronic medical conditions.  She has the following concerns:  Review Of Systems  Review of Systems  Objective:   PHYSICAL EXAM:  BP 122/74 (BP Location: Right Arm, Patient Position: Sitting, Cuff Size: Normal)   Pulse 79   Temp 98.6 F (37 C) (Temporal)   Ht 5\' 2"  (1.575 m)   Wt (!) 315 lb (142.9 kg)   SpO2 98%   BMI 57.61 kg/m   Depression Screening  Depression screen [facility] 2/9 06/23/2020 06/13/2020 11/18/2019 01/19/2019 10/06/2018  Decreased Interest 0 0 0 0 0  Down, Depressed, Hopeless 0 0 0 0 0  PHQ - 2 Score 0 0 0 0 0  Altered sleeping - - - - -  Tired, decreased energy - - - - -  Change in appetite - - - - -  Feeling bad or failure about yourself  - - - - -  Trouble concentrating - - - - -  Moving slowly or fidgety/restless - - - - -  Suicidal thoughts - - - - -  PHQ-9 Score - - - - -  Difficult doing work/chores - - - - -     Falls  Fall Risk  06/23/2020 06/13/2020 11/18/2019 01/19/2019 10/07/2018  Falls in the past year? 1 0 1 0 0  Number falls in past yr: 0 0 0 0 0  Injury with Fall? 0 0 0 0 0  Risk for fall due to : Impaired balance/gait No Fall Risks - - -  Follow up Falls evaluation completed Falls evaluation  completed - - -    Assessment & Plan:   No diagnosis found.  Tests ordered No orders of the defined types were placed in this encounter.    Plan: Please see assessment and plan per problem list above.   No orders of the defined types were placed in this encounter.   Discussed monthly self breast exams and yearly mammograms; at least 30 minutes of aerobic activity at least 5 days/week and weight-bearing exercise 2x/week; proper sunscreen use reviewed; healthy diet, including goals of calcium and vitamin D intake and alcohol recommendations (less than or equal to 1 drink/day) reviewed; regular seatbelt use; changing batteries in smoke detectors.  Immunization recommendations discussed.  Colonoscopy recommendations reviewed.   Medicare Attestation I have personally reviewed: The patient's medical and social history Their use of alcohol, tobacco or illicit drugs Their current medications and supplements The patient's functional ability including ADLs,fall risks, home safety risks, cognitive, and hearing and visual impairment Diet and physical activities Evidence for depression or mood disorders  The patient's weight, height, BMI, and visual acuity have been recorded in the chart.  I have made referrals, counseling, and provided education to the patient based on review of the above and I have provided the patient with a written personalized  care plan for preventive services.     Perlie Mayo, NP   06/23/2020

## 2020-06-23 NOTE — Progress Notes (Signed)
Health Maintenance reviewed -   Immunization History  Administered Date(s) Administered  . Influenza,inj,Quad PF,6+ Mos 01/21/2020  . Moderna Sars-Covid-2 Vaccination 07/06/2019, 08/06/2019, 02/21/2020  . Pneumococcal Polysaccharide-23 07/27/2008, 09/12/2015  . Tdap 05/01/2018   Last Pap smear:  [facility]-2020   Last mammogram: 01/2020 Last colonoscopy: 03/2019 Last DEXA: Due 31  Dentist: Last year- will see again this year.  Ophtho: Glasses- Last year, June appt  Exercise: Not really Smoker: None Alcohol Use: Limited   Other doctors caring for patient include:  Patient Care Team: Fayrene Helper, MD as PCP - General (Family Medicine)  End of Life Discussion:  Patient has a living will and medical power of attorney. Will bring copy in.  Subjective:   HPI  [name] is a 45 y.o. female who presents for annual comprehensive visit and follow-up on chronic medical conditions.  She has the following concerns:  Reports sleeping good overall, some times restless. Denies issues with bladder or bowel habits. Denies blood in urine or stool. Denies memory changes. Denies recent falls. Denies vision changes. Denies skin changes.    Review Of Systems  Review of Systems  Constitutional: Negative.   Respiratory: Negative.   Cardiovascular: Negative.   Gastrointestinal: Negative.   Genitourinary: Negative.   Neurological: Negative.     Objective:   PHYSICAL EXAM:  BP 122/74 (BP Location: Right Arm, Patient Position: Sitting, Cuff Size: Normal)   Pulse 79   Temp 98.6 F (37 C) (Temporal)   Ht 5\' 2"  (1.575 m)   Wt (!) 315 lb (142.9 kg)   SpO2 98%   BMI 57.61 kg/m   Physical Exam Vitals and nursing note reviewed.  Constitutional:      Appearance: Normal appearance. She is morbidly obese.  HENT:     Head: Normocephalic and atraumatic.     Right Ear: Tympanic membrane, ear canal and external ear normal.     Left Ear: Tympanic membrane, ear canal and  external ear normal.     Nose: Nose normal.     Mouth/Throat:     Comments: Mask in place  Eyes:     Extraocular Movements: Extraocular movements intact.     Conjunctiva/sclera: Conjunctivae normal.     Pupils: Pupils are equal, round, and reactive to light.  Cardiovascular:     Rate and Rhythm: Normal rate and regular rhythm.     Pulses: Normal pulses.          Radial pulses are 2+ on the right side and 2+ on the left side.       Dorsalis pedis pulses are 2+ on the right side and 2+ on the left side.     Heart sounds: Normal heart sounds.  Pulmonary:     Effort: Pulmonary effort is normal.     Breath sounds: Normal breath sounds.  Abdominal:     General: Abdomen is flat. Bowel sounds are normal.     Palpations: Abdomen is soft.     Tenderness: There is no right CVA tenderness or left CVA tenderness.  Musculoskeletal:        General: Normal range of motion.     Cervical back: Normal range of motion and neck supple.     Right lower leg: No edema.     Left lower leg: No edema.  Skin:    General: Skin is warm and dry.     Capillary Refill: Capillary refill takes less than 2 seconds.  Neurological:  General: No focal deficit present.     Mental Status: She is alert and oriented to person, place, and time. Mental status is at baseline.     Cranial Nerves: Cranial nerves are intact.     Sensory: Sensation is intact.     Motor: Motor function is intact.     Coordination: Coordination is intact.     Gait: Gait is intact.     Deep Tendon Reflexes: Reflexes are normal and symmetric.  Psychiatric:        Attention and Perception: Attention and perception normal.        Mood and Affect: Mood and affect normal.        Speech: Speech normal.        Behavior: Behavior normal. Behavior is cooperative.        Thought Content: Thought content normal.        Cognition and Memory: Memory normal.        Judgment: Judgment normal.    . Diabetic Foot Form - Detailed   Diabetic Foot Exam  - detailed Diabetic Foot exam was performed with the following findings: Yes 06/23/2020  9:43 AM  Visual Foot Exam completed.: Yes  Can the patient see the bottom of their feet?: Yes Are the shoes appropriate in style and fit?: Yes Is there swelling or and abnormal foot shape?: No Is there a claw toe deformity?: No Is there elevated skin temparature?: No Is there foot or ankle muscle weakness?: No Normal Range of Motion: Yes Pulse Foot Exam completed.: Yes  Sensory Foot Exam Completed.: Yes Semmes-Weinstein Monofilament Test R Site 1-Great Toe: Pos L Site 1-Great Toe: Pos    Comments: Good sensation bilaterally       Depression Screening  Depression screen [facility] 2/9 06/23/2020 06/13/2020 11/18/2019 01/19/2019 10/06/2018  Decreased Interest 0 0 0 0 0  Down, Depressed, Hopeless 0 0 0 0 0  PHQ - 2 Score 0 0 0 0 0  Altered sleeping - - - - -  Tired, decreased energy - - - - -  Change in appetite - - - - -  Feeling bad or failure about yourself  - - - - -  Trouble concentrating - - - - -  Moving slowly or fidgety/restless - - - - -  Suicidal thoughts - - - - -  PHQ-9 Score - - - - -  Difficult doing work/chores - - - - -     Falls  Fall Risk  06/23/2020 06/13/2020 11/18/2019 01/19/2019 10/07/2018  Falls in the past year? 1 0 1 0 0  Number falls in past yr: 0 0 0 0 0  Injury with Fall? 0 0 0 0 0  Risk for fall due to : Impaired balance/gait No Fall Risks - - -  Follow up Falls evaluation completed Falls evaluation completed - - -    Assessment & Plan:   1. Annual visit for general adult medical examination with abnormal findings   2. Morbid obesity ([place])   3. Vitamin D deficiency   4. Gastroesophageal reflux disease, unspecified whether esophagitis present   5. Diabetes mellitus type 2 in obese   6. Hyperlipidemia LDL goal <100     Tests ordered No orders of the defined types were placed in this encounter.    Plan: Please see assessment and plan per problem list  above.   Meds ordered this encounter  Medications  . ergocalciferol (VITAMIN D2) 1.25 MG (50000 UT) capsule    Sig:  Take 1 capsule (50,000 Units total) by mouth once a week. One capsule once weekly    Dispense:  12 capsule    Refill:  2    Order Specific Question:   Supervising Provider    Answer:   Tula Nakayama E [[id number]]   I have personally reviewed: The patient's medical and social history Their use of alcohol, tobacco or illicit drugs Their current medications and supplements The patient's functional ability including ADLs,fall risks, home safety risks, cognitive, and hearing and visual impairment Diet and physical activities Evidence for depression or mood disorders  The patient's weight, height, BMI, and visual acuity have been recorded in the chart.  I have made referrals, counseling, and provided education to the patient based on review of the above and I have provided the patient with a written personalized care plan for preventive services.     Perlie Mayo, NP   06/23/2020

## 2020-06-23 NOTE — Assessment & Plan Note (Signed)
Reordered -advised of activity like walking daily on nice days outside to help with Vitamin D level as well.

## 2020-06-23 NOTE — Patient Instructions (Signed)
I appreciate the opportunity to provide you with care for your health and wellness.  Follow up: 3 months for A1c check -please check appts to see if Dr Moshe Cipro scheduled this already   No labs or referrals today  Please continue all recommendations by Dr Moshe Cipro and today's visit. Especially focusing on diet and exercise.   Have a wonderful vacation!  Safe travels! Bring the Sunshine back to stay :)   Please continue to practice social distancing to keep you, your family, and our community safe.  If you must go out, please wear a mask and practice good handwashing.  It was a pleasure to see you and I look forward to continuing to work together on your health and well-being. Please do not hesitate to call the office if you need care or have questions about your care.  Have a wonderful day. With Gratitude, Cherly Beach, DNP, AGNP-BC

## 2020-06-23 NOTE — Assessment & Plan Note (Signed)
Doing well, no medication changes needed.

## 2020-06-23 NOTE — Assessment & Plan Note (Signed)
[name] is encouraged to check blood sugar daily as directed. Continue current medications. Is on statin as well.  Foot exam performed today.  She is reminded the importance of maintaining  good blood sugars,  taking medications as directed, daily foot care, annual eye exams. Additionally educated about keeping good control over blood pressure and cholesterol as well.

## 2020-06-23 NOTE — Assessment & Plan Note (Signed)
[name] is re-educated about the importance of exercise daily to help with weight management. A minumum of 30 minutes daily is recommended. Additionally, importance of healthy food choices  with portion control discussed.  Wt Readings from Last 3 Encounters:  06/23/20 (!) 315 lb (142.9 kg)  06/13/20 (!) 307 lb (139.3 kg)  04/25/20 300 lb (136.1 kg)

## 2020-06-23 NOTE — Assessment & Plan Note (Signed)
Discussed monthly self breast exams and yearly mammograms; at least 30 minutes of aerobic activity at least 5 days/week and weight-bearing exercise 2x/week; proper sunscreen use reviewed; healthy diet, including goals of calcium and vitamin D intake and alcohol recommendations (less than or equal to 1 drink/day) reviewed; regular seatbelt use; changing batteries in smoke detectors.  Immunization recommendations discussed.  Colonoscopy recommendations reviewed.  

## 2020-07-11 ENCOUNTER — Telehealth: Payer: 59 | Admitting: Family Medicine

## 2020-08-02 ENCOUNTER — Other Ambulatory Visit (HOSPITAL_COMMUNITY): Payer: Self-pay

## 2020-08-06 ENCOUNTER — Other Ambulatory Visit: Payer: Self-pay | Admitting: Family Medicine

## 2020-08-06 MED FILL — Fluticasone Propionate Nasal Susp 50 MCG/ACT: NASAL | 90 days supply | Qty: 48 | Fill #0 | Status: CN

## 2020-08-06 MED FILL — Ergocalciferol Cap 1.25 MG (50000 Unit): ORAL | 84 days supply | Qty: 12 | Fill #0 | Status: AC

## 2020-08-08 ENCOUNTER — Other Ambulatory Visit (HOSPITAL_COMMUNITY): Payer: Self-pay

## 2020-08-08 MED ORDER — PANTOPRAZOLE SODIUM 40 MG PO TBEC
40.0000 mg | DELAYED_RELEASE_TABLET | Freq: Every day | ORAL | 3 refills | Status: DC
  Filled 2020-08-08: qty 90, 90d supply, fill #0
  Filled 2020-11-02: qty 90, 90d supply, fill #1
  Filled 2021-02-10: qty 90, 90d supply, fill #2
  Filled 2021-05-08: qty 90, 90d supply, fill #3

## 2020-08-10 ENCOUNTER — Other Ambulatory Visit (HOSPITAL_COMMUNITY): Payer: Self-pay

## 2020-08-28 ENCOUNTER — Other Ambulatory Visit: Payer: Self-pay

## 2020-08-28 ENCOUNTER — Emergency Department (HOSPITAL_COMMUNITY)
Admission: EM | Admit: 2020-08-28 | Discharge: 2020-08-28 | Disposition: A | Discharge status: Home / Self Care | Payer: 59 | Attending: Emergency Medicine | Admitting: Emergency Medicine

## 2020-08-28 ENCOUNTER — Encounter (HOSPITAL_COMMUNITY): Payer: Self-pay

## 2020-08-28 DIAGNOSIS — Z7984 Long term (current) use of oral hypoglycemic drugs: Secondary | ICD-10-CM | POA: Insufficient documentation

## 2020-08-28 DIAGNOSIS — Z7982 Long term (current) use of aspirin: Secondary | ICD-10-CM | POA: Diagnosis not present

## 2020-08-28 DIAGNOSIS — E785 Hyperlipidemia, unspecified: Secondary | ICD-10-CM | POA: Diagnosis not present

## 2020-08-28 DIAGNOSIS — M5432 Sciatica, left side: Secondary | ICD-10-CM

## 2020-08-28 DIAGNOSIS — M5442 Lumbago with sciatica, left side: Secondary | ICD-10-CM | POA: Insufficient documentation

## 2020-08-28 DIAGNOSIS — E1169 Type 2 diabetes mellitus with other specified complication: Secondary | ICD-10-CM | POA: Insufficient documentation

## 2020-08-28 DIAGNOSIS — Z79899 Other long term (current) drug therapy: Secondary | ICD-10-CM | POA: Insufficient documentation

## 2020-08-28 DIAGNOSIS — M545 Low back pain, unspecified: Secondary | ICD-10-CM | POA: Diagnosis present

## 2020-08-28 MED ORDER — NAPROXEN 500 MG PO TABS
500.0000 mg | ORAL_TABLET | Freq: Two times a day (BID) | ORAL | 0 refills | Status: DC
  Filled 2020-08-28: qty 30, 15d supply, fill #0

## 2020-08-28 MED ORDER — KETOROLAC TROMETHAMINE 60 MG/2ML IM SOLN
60.0000 mg | Freq: Once | INTRAMUSCULAR | Status: AC
  Administered 2020-08-28: 60 mg via INTRAMUSCULAR
  Filled 2020-08-28: qty 2

## 2020-08-28 MED ORDER — PREDNISONE 20 MG PO TABS
40.0000 mg | ORAL_TABLET | Freq: Every day | ORAL | 0 refills | Status: DC
  Filled 2020-08-28: qty 10, 5d supply, fill #0

## 2020-08-28 NOTE — Discharge Instructions (Addendum)
Return to the emergency department specifically for increasing pain numbness or weakness.  I would like for you to see your family doctor within the next week for a recheck if you are not completely better.  If your symptoms are worsening including severe or worsening numbness or weakness you should return to the emergency department immediately for a repeat evaluation.

## 2020-08-28 NOTE — ED Triage Notes (Signed)
Pt arrived via POV c/o left lower back pain. Pt reports having lower back pain that originated when getting in and out her car when she went grocery shopping. Pt reports pain is debilitating at the moment and she is having difficulty with ambulation. Pt reports trying Aleve at home without relief.

## 2020-08-28 NOTE — ED Provider Notes (Addendum)
[facility] EMERGENCY DEPARTMENT Provider Note   CSN: [id number] Arrival date & time: 08/28/20  1933     History Chief Complaint  Patient presents with  . Back Pain    [name] is a 45 y.o. female.  HPI   This patient is a 45 year old female, she is a known type II diabetic, she presents to the hospital with a complaint of left-sided lower back pain radiating into the left buttock and down the left leg.  This started yesterday when she was at the grocery store, she felt a twinge, she got in the car and tried to drive home and by the time she got out of the car it was very difficult to walk secondary to the pain in the left side.  This has been intermittent, worse with certain positions including extending the quadricep and trying to bend over, it is relieved in the laying position and by flexing the knee and the hip.  She has no numbness but feels occasional tingling in the left foot.  She has taken naproxen and ibuprofen without any significant relief, took a Flexeril that was she was given by a family member which made her sleepy but did not help with the pain.  She does not have a history of sciatica, she does have a history of a couple of epidurals in the past related to childbirth and remembers having a nerve injury from that which was temporary.  She denies any other history of back surgery or back problems.  Past Medical History:  Diagnosis Date  . Allergy   . Anemia   . Arthritis   . Diabetes mellitus    type II   . Educated about COVID-19 virus infection 09/07/2018  . Family history of colonic polyps   . GERD (gastroesophageal reflux disease)   . Iron deficiency anemia 09/03/2007   Qualifier: Diagnosis of  By: Rennie Plowman    . PONV (postoperative nausea and vomiting)   . 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 Annual visit for general adult medical examination with abnormal findings  06/23/2020  . Knee pain, right 05/21/2019  . Adenomatous polyp of ascending colon   . Dyslipidemia 09/07/2018  . Morbid obesity ([place]) 09/12/2015  . Vitamin B 12 deficiency 11/22/2011  . Vitamin D deficiency 11/22/2011  . Diabetes mellitus type 2 in obese 09/03/2007  . Hyperlipidemia LDL goal <100 09/03/2007  . Allergic rhinitis 09/03/2007  . GERD 09/03/2007    Past Surgical History:  Procedure Laterality Date  . CARDIAC CATHETERIZATION    . CESAREAN SECTION     x 2  . COLONOSCOPY WITH PROPOFOL N/A 04/20/2019   Procedure: COLONOSCOPY WITH PROPOFOL;  Surgeon: Thornton Park, MD;  Location: [facility];  Service: Gastroenterology;  Laterality: N/A;  . LAPAROSCOPIC GASTRIC SLEEVE RESECTION N/A 04/02/2016   Procedure: LAPAROSCOPIC GASTRIC SLEEVE RESECTION, UPPER ENDO;  Surgeon: Johnathan Hausen, MD;  Location: [facility];  Service: General;  Laterality: N/A;  . POLYPECTOMY  04/20/2019   Procedure: POLYPECTOMY;  Surgeon: Thornton Park, MD;  Location: [facility];  Service: Gastroenterology;;     OB History    Gravida  2   Para  2   Term      Preterm      AB      Living  2     SAB      IAB      Ectopic  Multiple      Live Births  2           Family History  Problem Relation Age of Onset  . Hypertension Mother   . Colon polyps Mother   . COPD Father   . Diabetes Father   . Heart disease Father   . Hypertension Father   . Kidney disease Father   . Stroke Father   . Diabetes Daughter   . Diabetes Daughter   . Colon polyps Maternal Aunt     Social History   Tobacco Use  . Smoking status: Never Smoker  . Smokeless tobacco: Never Used  Vaping Use  . Vaping Use: Never used  Substance Use Topics  . Alcohol use: Yes    Comment: occassional  . Drug use: No    Home Medications Prior to Admission medications   Medication Sig Start Date End Date Taking? Authorizing Provider  naproxen (NAPROSYN) 500 MG tablet Take 1 tablet (500 mg total) by  mouth 2 (two) times daily with a meal. 08/28/20  Yes Noemi Chapel, MD  predniSONE (DELTASONE) 20 MG tablet Take 2 tablets (40 mg total) by mouth daily. 08/28/20  Yes Noemi Chapel, MD  ACCU-CHEK FASTCLIX LANCETS MISC Use as directed once daily DX e11.9 05/01/18   Fayrene Helper, MD  acetic acid-hydrocortisone (VOSOL-HC) OTIC solution Two drops to affected ears three times daily , as needed, for itch 05/20/19   Fayrene Helper, MD  aspirin EC 81 MG tablet Take 81 mg by mouth daily.    [provider]  blood glucose meter kit and supplies KIT Dispense based on patient and insurance preference. Test once daily. (FOR ICD-9 250.00, 250.01). 08/21/16   Fayrene Helper, MD  blood glucose meter kit and supplies Dispense meter used by [employer] program. Once daily testing dx e11.9 11/18/19   Fayrene Helper, MD  cetirizine (ZYRTEC) 10 MG tablet Take 10 mg by mouth daily as needed for allergies.    [provider]  diclofenac sodium (VOLTAREN) 1 % GEL APPLY TO AFFECTED AREA ON KNEE AS NEEDED FOR NEED PAIN Patient taking differently: Apply 1 application topically 4 (four) times daily as needed (knee pain). 01/16/18   Fayrene Helper, MD  ergocalciferol (VITAMIN D2) 1.25 MG (50000 UT) capsule Take 1 capsule (50,000 Units total) by mouth once a week. One capsule once weekly 06/23/20   Perlie Mayo, NP  ferrous sulfate 325 (65 FE) MG tablet Take 325 mg by mouth daily.    [provider]  fluticasone (FLONASE) 50 MCG/ACT nasal spray PLACE 2 SPRAYS INTO BOTH NOSTRILS DAILY. Patient taking differently: Place 2 sprays into both nostrils daily as needed for allergies. 03/23/19   Fayrene Helper, MD  fluticasone (FLONASE) 50 MCG/ACT nasal spray PLACE 2 SPRAYS INTO BOTH NOSTRILS DAILY. 06/13/20 06/13/21  Fayrene Helper, MD  glipiZIDE (GLUCOTROL XL) 10 MG 24 hr tablet TAKE 1 TABLET (10 MG TOTAL) BY MOUTH DAILY WITH BREAKFAST. 06/13/20 06/13/21  Fayrene Helper, MD   glucose blood test strip Use as instructed once daily dx e11.9 05/01/18   Fayrene Helper, MD  levonorgestrel (MIRENA) 20 MCG/24HR IUD 1 each by Intrauterine route once.    [provider]  metFORMIN (GLUCOPHAGE) 1000 MG tablet TAKE 1 TABLET BY MOUTH TWICE DAILY WITH MEAL. 06/13/20 06/13/21  Fayrene Helper, MD  Multiple Vitamin (MULTIVITAMIN) capsule Take 1 capsule by mouth 3 (three) times a week.     [provider]  pantoprazole (PROTONIX) 40 MG tablet Take 1 tablet (40 mg total) by mouth daily. 08/08/20 08/08/21  Fayrene Helper, MD  phentermine (ADIPEX-P) 37.5 MG tablet Take one tablet by mouth three times weekly before breakfast 05/20/19   Fayrene Helper, MD  rosuvastatin (CRESTOR) 5 MG tablet Take one tablet 3 times weekly Patient taking differently: Take 5 mg by mouth every Monday, Wednesday, and Friday. 05/01/18   Fayrene Helper, MD  Vitamin D, Ergocalciferol, (DRISDOL) 1.25 MG (50000 UNIT) CAPS capsule Take 1 capsule (50,000 Units total) by mouth once a week. 06/23/20   Perlie Mayo, NP  Vitamin D, Ergocalciferol, (DRISDOL) 1.25 MG (50000 UNIT) CAPS capsule TAKE 1 CAPSULE (50,000 UNITS TOTAL) BY MOUTH ONCE A WEEK. 06/13/20 06/13/21  Fayrene Helper, MD    Allergies    Patient has no known allergies.  Review of Systems   Review of Systems  Constitutional: Negative for fever.  Musculoskeletal: Positive for back pain.  Neurological: Negative for weakness and numbness.    Physical Exam Updated Vital Signs BP (P) 135/73 (BP Location: Right Arm) Comment: Simultaneous filing. User may not have seen previous data. Comment (BP Location): Simultaneous filing. User may not have seen previous data.  Pulse (P) 78 Comment: Simultaneous filing. User may not have seen previous data.  Temp (P) 98.6 F (37 C) (Oral) Comment: Simultaneous filing. User may not have seen previous data. Comment (Src): Simultaneous filing. User may not have seen previous data.  Resp  (P) 18 Comment: Simultaneous filing. User may not have seen previous data.  Ht 1.575 m ($Remove'5\' 2"'njoLnWS$ )   Wt (!) 142.9 kg   SpO2 (P) 100% Comment: Simultaneous filing. User may not have seen previous data.  BMI 57.62 kg/m   Physical Exam Constitutional:      General: She is not in acute distress.    Appearance: She is well-developed. She is not diaphoretic.  HENT:     Head: Normocephalic and atraumatic.  Eyes:     General: No scleral icterus.       Right eye: No discharge.        Left eye: No discharge.     Conjunctiva/sclera: Conjunctivae normal.  Cardiovascular:     Rate and Rhythm: Normal rate and regular rhythm.  Pulmonary:     Effort: Pulmonary effort is normal.     Breath sounds: Normal breath sounds.  Musculoskeletal:     Comments: Tenderness of the back over the left lower back No tenderness over the lumbar spine  Skin:    General: Skin is warm and dry.     Findings: No rash.  Neurological:     Comments: Speech is clear, strength in the UE and LE's are normal at the major muscle groups including the hip, knee and ankles.  Sensation in tact to light touch and pin prick of the bilateral LE's.  Gait is antalgic secondary to pain but able to perform all activities, she has pain with straight leg raise passively     ED Results / Procedures / Treatments   Labs (all labs ordered are listed, but only abnormal results are displayed) Labs Reviewed - No data to display  EKG None  Radiology No results found.  Procedures Procedures   Medications Ordered in ED Medications  ketorolac (TORADOL) injection 60 mg (has no administration in time range)    ED Course  I have reviewed the triage vital signs and the nursing notes.  Pertinent labs & imaging results  that were available during my care of the patient were reviewed by me and considered in my medical decision making (see chart for details).    MDM Rules/Calculators/A&P                          Overall well-appearing,  stable, normal vitals, has what appears to be sciatic pain on the left side, will be treated with prednisone in addition to ongoing anti-inflammatories at home, she is agreeable, no indication for advanced neuroimaging at this time.  Recommended close family doctor follow-up.  Final Clinical Impression(s) / ED Diagnoses Final diagnoses:  Sciatica of left side    Rx / DC Orders ED Discharge Orders         Ordered    predniSONE (DELTASONE) 20 MG tablet  Daily        08/28/20 2008    naproxen (NAPROSYN) 500 MG tablet  2 times daily with meals        08/28/20 2008           Noemi Chapel, MD 08/28/20 2009    Noemi Chapel, MD 08/28/20 2010

## 2020-08-29 ENCOUNTER — Other Ambulatory Visit (HOSPITAL_COMMUNITY): Payer: Self-pay

## 2020-08-29 ENCOUNTER — Encounter: Payer: Self-pay | Admitting: Family Medicine

## 2020-08-30 ENCOUNTER — Encounter: Payer: Self-pay | Admitting: Nurse Practitioner

## 2020-08-30 ENCOUNTER — Other Ambulatory Visit: Payer: Self-pay

## 2020-08-30 ENCOUNTER — Telehealth: Payer: Self-pay

## 2020-08-30 ENCOUNTER — Ambulatory Visit: Payer: 59 | Admitting: Nurse Practitioner

## 2020-08-30 DIAGNOSIS — M5432 Sciatica, left side: Secondary | ICD-10-CM | POA: Insufficient documentation

## 2020-08-30 NOTE — Telephone Encounter (Signed)
FMLA forms received  Copied Noted Sleeved

## 2020-08-30 NOTE — Assessment & Plan Note (Addendum)
-  is on day #2 of 40 mg of prednisone daily, and pain is slowly improving -also taking prescription naproxen -will provide work note until Sunday -if pain persists beyond Sunday, will consider ortho consult -she may need FMLA paperwork

## 2020-08-30 NOTE — Patient Instructions (Addendum)
I hope you feel better fast.  We will do the work note to be out through Sunday. If you need to be out longer than that, let us know, and we will consider an ortho consult.  We will get FMLA paperwork completed for you.

## 2020-08-30 NOTE — Progress Notes (Signed)
Acute Office Visit  Subjective:    Patient ID: [name], female    DOB: 1975/11/07, 45 y.o.   MRN: [id number]  Chief Complaint  Patient presents with  . Sciatica    Pt following up from ER visit on Sunday 08/28/20; pt had severe back pain. NKI    HPI Patient is in today for left sciatica pain. The pain started 08/27/20 when "she was at the grocery store, she felt a twinge, she got in the car and tried to drive home and by the time she got out of the car it was very difficult to walk secondary to the pain in the left side.  This has been intermittent, worse with certain positions including extending the quadricep and trying to bend over, it is relieved in the laying position and by flexing the knee and the hip.  She has no numbness but feels occasional tingling in the left foot.  She has taken naproxen and ibuprofen without any significant relief, took a Flexeril that was she was given by a family member which made her sleepy but did not help with the pain.  She does not have a history of sciatica, she does have a history of a couple of epidurals in the past related to childbirth and remembers having a nerve injury from that which was temporary."    She was treated with prednisone and naproxen at the ED. She states she started prednisone after getting from the pharmacy yesterday, and her pain has improved some.  Past Medical History:  Diagnosis Date  . Allergy   . Anemia   . Arthritis   . Diabetes mellitus    type II   . Educated about COVID-19 virus infection 09/07/2018  . Family history of colonic polyps   . GERD (gastroesophageal reflux disease)   . Iron deficiency anemia 09/03/2007   Qualifier: Diagnosis of  By: Rennie Plowman    . PONV (postoperative nausea and vomiting)   . 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 CARDIAC CATHETERIZATION    . CESAREAN SECTION     x 2  . COLONOSCOPY WITH PROPOFOL N/A  04/20/2019   Procedure: COLONOSCOPY WITH PROPOFOL;  Surgeon: Thornton Park, MD;  Location: [facility];  Service: Gastroenterology;  Laterality: N/A;  . LAPAROSCOPIC GASTRIC SLEEVE RESECTION N/A 04/02/2016   Procedure: LAPAROSCOPIC GASTRIC SLEEVE RESECTION, UPPER ENDO;  Surgeon: Johnathan Hausen, MD;  Location: [facility];  Service: General;  Laterality: N/A;  . POLYPECTOMY  04/20/2019   Procedure: POLYPECTOMY;  Surgeon: Thornton Park, MD;  Location: [facility];  Service: Gastroenterology;;    Family History  Problem Relation Age of Onset  . Hypertension Mother   . Colon polyps Mother   . COPD Father   . Diabetes Father   . Heart disease Father   . Hypertension Father   . Kidney disease Father   . Stroke Father   . Diabetes Daughter   . Diabetes Daughter   . Colon polyps Maternal Aunt     Social History   Socioeconomic History  . Marital status: Married    Spouse name: Not on file  . Number of children: 2  . Years of education: Not on file  . Highest education level: Not on file  Occupational History  . Not on file  Tobacco Use  . Smoking status: Never Smoker  . Smokeless tobacco: Never Used  Vaping Use  . Vaping Use:  Never used  Substance and Sexual Activity  . Alcohol use: Yes    Comment: occassional  . Drug use: No  . Sexual activity: Yes    Birth control/protection: I.U.D.  Other Topics Concern  . Not on file  Social History Narrative  . Not on file   Social Determinants of Health   Financial Resource Strain: Low Risk   . Difficulty of Paying Living Expenses: Not hard at all  Food Insecurity: No Food Insecurity  . Worried About Charity fundraiser in the Last Year: Never true  . Ran Out of Food in the Last Year: Never true  Transportation Needs: No Transportation Needs  . Lack of Transportation (Medical): No  . Lack of Transportation (Non-Medical): No  Physical Activity: Insufficiently Active  . Days of Exercise per Week: 1 day  . Minutes of  Exercise per Session: 30 min  Stress: No Stress Concern Present  . Feeling of Stress : Only a little  Social Connections: Socially Integrated  . Frequency of Communication with Friends and Family: More than three times a week  . Frequency of Social Gatherings with Friends and Family: More than three times a week  . Attends Religious Services: More than 4 times per year  . Active Member of Clubs or Organizations: Yes  . Attends Archivist Meetings: More than 4 times per year  . Marital Status: Married  Human resources officer Violence: Not At Risk  . Fear of Current or Ex-Partner: No  . Emotionally Abused: No  . Physically Abused: No  . Sexually Abused: No    Outpatient Medications Prior to Visit  Medication Sig Dispense Refill  . ACCU-CHEK FASTCLIX LANCETS MISC Use as directed once daily DX e11.9 100 each 5  . acetic acid-hydrocortisone (VOSOL-HC) OTIC solution Two drops to affected ears three times daily , as needed, for itch 10 mL 0  . aspirin EC 81 MG tablet Take 81 mg by mouth daily.    . blood glucose meter kit and supplies KIT Dispense based on patient and insurance preference. Test once daily. (FOR ICD-9 250.00, 250.01). 1 each 0  . blood glucose meter kit and supplies Dispense meter used by [employer] program. Once daily testing dx e11.9 1 each 0  . cetirizine (ZYRTEC) 10 MG tablet Take 10 mg by mouth daily as needed for allergies.    Marland Kitchen diclofenac sodium (VOLTAREN) 1 % GEL APPLY TO AFFECTED AREA ON KNEE AS NEEDED FOR NEED PAIN (Patient taking differently: Apply 1 application topically 4 (four) times daily as needed (knee pain).) 300 g 1  . ferrous sulfate 325 (65 FE) MG tablet Take 325 mg by mouth daily.    . fluticasone (FLONASE) 50 MCG/ACT nasal spray PLACE 2 SPRAYS INTO BOTH NOSTRILS DAILY. (Patient taking differently: Place 2 sprays into both nostrils daily as needed for allergies.) 48 g 3  . fluticasone (FLONASE) 50 MCG/ACT nasal spray PLACE 2 SPRAYS INTO BOTH NOSTRILS  DAILY. 48 g 1  . glipiZIDE (GLUCOTROL XL) 10 MG 24 hr tablet TAKE 1 TABLET (10 MG TOTAL) BY MOUTH DAILY WITH BREAKFAST. 90 tablet 1  . glucose blood test strip Use as instructed once daily dx e11.9 100 each 5  . levonorgestrel (MIRENA) 20 MCG/24HR IUD 1 each by Intrauterine route once.    . metFORMIN (GLUCOPHAGE) 1000 MG tablet TAKE 1 TABLET BY MOUTH TWICE DAILY WITH MEAL. 180 tablet 3  . Multiple Vitamin (MULTIVITAMIN) capsule Take 1 capsule by mouth 3 (three) times a  week.     . naproxen (NAPROSYN) 500 MG tablet Take 1 tablet (500 mg total) by mouth 2 (two) times daily with a meal. 30 tablet 0  . pantoprazole (PROTONIX) 40 MG tablet Take 1 tablet (40 mg total) by mouth daily. 90 tablet 3  . phentermine (ADIPEX-P) 37.5 MG tablet Take one tablet by mouth three times weekly before breakfast 36 tablet 1  . predniSONE (DELTASONE) 20 MG tablet Take 2 tablets (40 mg total) by mouth daily. 10 tablet 0  . rosuvastatin (CRESTOR) 5 MG tablet Take one tablet 3 times weekly (Patient taking differently: Take 5 mg by mouth every Monday, Wednesday, and Friday.) 36 tablet 3  . Vitamin D, Ergocalciferol, (DRISDOL) 1.25 MG (50000 UNIT) CAPS capsule Take 1 capsule (50,000 Units total) by mouth once a week. 12 capsule 2  . ergocalciferol (VITAMIN D2) 1.25 MG (50000 UT) capsule Take 1 capsule (50,000 Units total) by mouth once a week. One capsule once weekly 12 capsule 2  . Vitamin D, Ergocalciferol, (DRISDOL) 1.25 MG (50000 UNIT) CAPS capsule TAKE 1 CAPSULE (50,000 UNITS TOTAL) BY MOUTH ONCE A WEEK. 12 capsule 2   No facility-administered medications prior to visit.    No Known Allergies  Review of Systems  Constitutional: Negative.   Respiratory: Negative.   Cardiovascular: Negative.   Musculoskeletal: Positive for back pain.  Psychiatric/Behavioral: Negative.        Objective:    Physical Exam Constitutional:      Appearance: Normal appearance. She is obese.  Musculoskeletal:     Comments: Positive  right straight leg test  Neurological:     Mental Status: She is alert.     BP 126/71   Pulse 67   Temp 99 F (37.2 C)   Resp 20   Ht _0  (1.575 m)   Wt (!) 316 lb (143.3 kg)   SpO2 96%   BMI 57.80 kg/m  Wt Readings from Last 3 Encounters:  08/30/20 (!) 316 lb (143.3 kg)  08/28/20 (!) 315 lb 0.6 oz (142.9 kg)  06/23/20 (!) 315 lb (142.9 kg)    There are no preventive care reminders to display for this patient.  There are no preventive care reminders to display for this patient.   Lab Results  Component Value Date   TSH 3.840 06/09/2020   Lab Results  Component Value Date   WBC 6.0 06/09/2020   HGB 11.9 06/09/2020   HCT 37.8 06/09/2020   MCV 88 06/09/2020   PLT 259 06/09/2020   Lab Results  Component Value Date   NA 139 06/09/2020   K 4.4 06/09/2020   CO2 20 06/09/2020   GLUCOSE 194 (H) 06/09/2020   BUN 13 06/09/2020   CREATININE 0.70 06/09/2020   BILITOT 0.3 06/09/2020   ALKPHOS 87 06/09/2020   AST 13 06/09/2020   ALT 9 06/09/2020   PROT 7.3 06/09/2020   ALBUMIN 4.2 06/09/2020   CALCIUM 9.4 06/09/2020   ANIONGAP 7 04/30/2018   Lab Results  Component Value Date   CHOL 181 06/09/2020   Lab Results  Component Value Date   HDL 73 06/09/2020   Lab Results  Component Value Date   LDLCALC 96 06/09/2020   Lab Results  Component Value Date   TRIG 62 06/09/2020   Lab Results  Component Value Date   CHOLHDL 2.5 06/09/2020   Lab Results  Component Value Date   HGBA1C 8.9 (H) 06/09/2020       Assessment & Plan:  Problem List Items Addressed This Visit      Nervous and Auditory   Sciatica, left side    -is on day #2 of 40 mg of prednisone daily, and pain is slowly improving -also taking prescription naproxen -will provide work note until Sunday -if pain persists beyond Sunday, will consider ortho consult -she may need FMLA paperwork          No orders of the defined types were placed in this encounter.    Noreene Larsson, NP

## 2020-08-31 NOTE — Telephone Encounter (Signed)
Complete  Patient to pick up

## 2020-09-26 ENCOUNTER — Ambulatory Visit: Payer: 59 | Admitting: Family Medicine

## 2020-10-03 MED FILL — Glipizide Tab ER 24HR 10 MG: ORAL | 90 days supply | Qty: 90 | Fill #0 | Status: AC

## 2020-10-04 ENCOUNTER — Other Ambulatory Visit (HOSPITAL_COMMUNITY): Payer: Self-pay

## 2020-10-10 ENCOUNTER — Encounter: Payer: Self-pay | Admitting: Family Medicine

## 2020-10-11 ENCOUNTER — Other Ambulatory Visit: Payer: Self-pay | Admitting: *Deleted

## 2020-10-11 DIAGNOSIS — E1169 Type 2 diabetes mellitus with other specified complication: Secondary | ICD-10-CM

## 2020-10-11 DIAGNOSIS — E785 Hyperlipidemia, unspecified: Secondary | ICD-10-CM

## 2020-10-11 NOTE — Telephone Encounter (Signed)
Ordered saw where pt was notified  and will come to have them drawn

## 2020-10-12 ENCOUNTER — Other Ambulatory Visit: Payer: Self-pay

## 2020-10-12 DIAGNOSIS — E1169 Type 2 diabetes mellitus with other specified complication: Secondary | ICD-10-CM

## 2020-10-12 DIAGNOSIS — E669 Obesity, unspecified: Secondary | ICD-10-CM | POA: Diagnosis not present

## 2020-10-13 LAB — BMP8+EGFR
BUN/Creatinine Ratio: 30 — ABNORMAL HIGH (ref 9–23)
BUN: 18 mg/dL (ref 6–24)
CO2: 20 mmol/L (ref 20–29)
Calcium: 9.1 mg/dL (ref 8.7–10.2)
Chloride: 103 mmol/L (ref 96–106)
Creatinine, Ser: 0.61 mg/dL (ref 0.57–1.00)
Glucose: 113 mg/dL — ABNORMAL HIGH (ref 65–99)
Potassium: 4.4 mmol/L (ref 3.5–5.2)
Sodium: 139 mmol/L (ref 134–144)
eGFR: 111 mL/min/{1.73_m2} (ref >59)

## 2020-10-13 LAB — HEMOGLOBIN A1C
Est. average glucose Bld gHb Est-mCnc: 154 mg/dL
Hgb A1c MFr Bld: 7 % — ABNORMAL HIGH (ref 4.8–5.6)

## 2020-10-18 ENCOUNTER — Other Ambulatory Visit: Payer: Self-pay

## 2020-10-18 ENCOUNTER — Ambulatory Visit: Payer: 59 | Admitting: Family Medicine

## 2020-10-18 ENCOUNTER — Ambulatory Visit: Payer: 59

## 2020-10-18 ENCOUNTER — Encounter: Payer: Self-pay | Admitting: Family Medicine

## 2020-10-18 VITALS — BP 108/70 | HR 70 | Temp 98.8°F | Resp 20 | Ht 62.0 in | Wt 321.0 lb

## 2020-10-18 DIAGNOSIS — J309 Allergic rhinitis, unspecified: Secondary | ICD-10-CM

## 2020-10-18 DIAGNOSIS — E1169 Type 2 diabetes mellitus with other specified complication: Secondary | ICD-10-CM | POA: Diagnosis not present

## 2020-10-18 DIAGNOSIS — Z1231 Encounter for screening mammogram for malignant neoplasm of breast: Secondary | ICD-10-CM

## 2020-10-18 DIAGNOSIS — E669 Obesity, unspecified: Secondary | ICD-10-CM | POA: Diagnosis not present

## 2020-10-18 DIAGNOSIS — E785 Hyperlipidemia, unspecified: Secondary | ICD-10-CM | POA: Diagnosis not present

## 2020-10-18 DIAGNOSIS — E559 Vitamin D deficiency, unspecified: Secondary | ICD-10-CM | POA: Diagnosis not present

## 2020-10-18 LAB — HM DIABETES EYE EXAM

## 2020-10-18 NOTE — Patient Instructions (Addendum)
F/u 2nd week in October, call if you need me sooner  CONGRATS  on improved blood sugar, please get in touch as you do your wellness/ personal trainer, in case your sugar starts falling below 80, I will need to reduce the glipizide dose  Please schedule mammogram at checkout   Fasting lipid, cmp and EGFr, HBA1C and vit D 3 to 5 days before next visit  BEST with wellness, great decision, also joints will improve with regular exercise program and weight loss   Thanks for choosing [facility], we consider it a privelige to serve you.

## 2020-10-20 ENCOUNTER — Encounter: Payer: Self-pay | Admitting: *Deleted

## 2020-10-20 ENCOUNTER — Encounter: Payer: Self-pay | Admitting: Family Medicine

## 2020-10-20 NOTE — Assessment & Plan Note (Signed)
Controlled, no change in medication, markedly improved, GREAT! [name] is reminded of the importance of commitment to daily physical activity for 30 minutes or more, as able and the need to limit carbohydrate intake to 30 to 60 grams per meal to help with blood sugar control.   The need to take medication as prescribed, test blood sugar as directed, and to call between visits if there is a concern that blood sugar is uncontrolled is also discussed.   [name] is reminded of the importance of daily foot exam, annual eye examination, and good blood sugar, blood pressure and cholesterol control.  Diabetic Labs Latest Ref Rng & Units 10/12/2020 06/09/2020 11/18/2019 11/18/2019 11/18/2019  HbA1c 4.8 - 5.6 % 7.0(H) 8.9(H) 6.5(A) 6.5 6.5  Microalbumin mg/dL - - - - -  Micro/Creat Ratio 0 - 29 mg/g creat - 17 - - -  Chol 100 - 199 mg/dL - 181 167 - -  HDL >39 mg/dL - 73 63 - -  Calc LDL 0 - 99 mg/dL - 96 94 - -  Triglycerides 0 - 149 mg/dL - 62 47 - -  Creatinine 0.57 - 1.00 mg/dL 0.61 0.70 0.60 - -   BP/Weight 10/18/2020 08/30/2020 08/28/2020 06/23/2020 06/13/2020 04/25/2020 9/84/7308  Systolic BP 569 437 005 259 102 - 890  Diastolic BP 70 71 73 74 72 - 75  Wt. (Lbs) 321 316 315.04 315 307 300 306  BMI 58.71 57.8 57.62 57.61 56.15 54.87 55.97   Foot/eye exam completion dates Latest Ref Rng & Units 06/23/2020 09/25/2019  Eye Exam No Retinopathy - No Retinopathy  Foot Form Completion - Done -

## 2020-10-20 NOTE — Progress Notes (Signed)
[name]     MRN: [id number]      DOB: 28-Sep-1975   HPI [name] is here for follow up and re-evaluation of chronic medical conditions, medication management and review of any available recent lab and radiology data.  Preventive health is updated, specifically  Cancer screening and Immunization.   Starting a wellness program to help with weight management and improved health Weight gain is her primary concern Denies polyuria, polydipsia, blurred vision , or hypoglycemic episodes.  The PT denies any adverse reactions to current medications since the last visit.    ROS Denies recent fever or chills. Denies sinus pressure, nasal congestion, ear pain or sore throat. Denies chest congestion, productive cough or wheezing. Denies chest pains, palpitations and leg swelling Denies abdominal pain, nausea, vomiting,diarrhea or constipation.   Denies dysuria, frequency, hesitancy or incontinence. Had episode of severe back pain recently which took her out of work, has bilateral knee pain Denies headaches, seizures, numbness, or tingling. Denies depression, anxiety or insomnia. Denies skin break down or rash.   PE  BP 108/70 (BP Location: Right Arm, Patient Position: Sitting, Cuff Size: Large)   Pulse 70   Temp 98.8 F (37.1 C)   Resp 20   Ht 5\' 2"  (1.575 m)   Wt (!) 321 lb (145.6 kg)   SpO2 96%   BMI 58.71 kg/m   Patient alert and oriented and in no cardiopulmonary distress.  HEENT: No facial asymmetry, EOMI,     Neck supple .  Chest: Clear to auscultation bilaterally.  CVS: S1, S2 no murmurs, no S3.Regular rate.  ABD: Soft non tender.   Ext: No edema  MS: Adequate ROM spine, shoulders, hips and reduced in knees.  Skin: Intact, no ulcerations or rash noted.  Psych: Good eye contact, normal affect. Memory intact not anxious or depressed appearing.  CNS: CN 2-12 intact, power,  normal throughout.no focal deficits noted.   Assessment & Plan  Diabetes mellitus  type 2 in obese Controlled, no change in medication, markedly improved, GREAT! [name] is reminded of the importance of commitment to daily physical activity for 30 minutes or more, as able and the need to limit carbohydrate intake to 30 to 60 grams per meal to help with blood sugar control.   The need to take medication as prescribed, test blood sugar as directed, and to call between visits if there is a concern that blood sugar is uncontrolled is also discussed.   [name] is reminded of the importance of daily foot exam, annual eye examination, and good blood sugar, blood pressure and cholesterol control.  Diabetic Labs Latest Ref Rng & Units 10/12/2020 06/09/2020 11/18/2019 11/18/2019 11/18/2019  HbA1c 4.8 - 5.6 % 7.0(H) 8.9(H) 6.5(A) 6.5 6.5  Microalbumin mg/dL - - - - -  Micro/Creat Ratio 0 - 29 mg/g creat - 17 - - -  Chol 100 - 199 mg/dL - 181 167 - -  HDL >39 mg/dL - 73 63 - -  Calc LDL 0 - 99 mg/dL - 96 94 - -  Triglycerides 0 - 149 mg/dL - 62 47 - -  Creatinine 0.57 - 1.00 mg/dL 0.61 0.70 0.60 - -   BP/Weight 10/18/2020 08/30/2020 08/28/2020 06/23/2020 06/13/2020 04/25/2020 3/53/6144  Systolic BP 315 400 867 619 509 - 326  Diastolic BP 70 71 73 74 72 - 75  Wt. (Lbs) 321 316 315.04 315 307 300 306  BMI 58.71 57.8 57.62 57.61 56.15 54.87 55.97   Foot/eye exam  completion dates Latest Ref Rng & Units 06/23/2020 09/25/2019  Eye Exam No Retinopathy - No Retinopathy  Foot Form Completion - Done -        Morbid obesity ([place]) Deteriorated Starting wellness program  Patient re-educated about  the importance of commitment to a  minimum of 150 minutes of exercise per week as able.  The importance of healthy food choices with portion control discussed, as well as eating regularly and within a 12 hour window most days. The need to choose "clean , green" food 50 to 75% of the time is discussed, as well as to make water the primary drink and set a goal of 64 ounces water daily.    Weight /BMI  10/18/2020 08/30/2020 08/28/2020  WEIGHT 321 lb 316 lb 315 lb 0.6 oz  HEIGHT 5\' 2"  5\' 2"  5\' 2"   BMI 58.71 kg/m2 57.8 kg/m2 57.62 kg/m2      Vitamin D deficiency Updated lab needed at/ before next visit.   Allergic rhinitis Controlled, no change in medication   Dyslipidemia Hyperlipidemia:Low fat diet discussed and encouraged.   Lipid Panel  Lab Results  Component Value Date   CHOL 181 06/09/2020   HDL 73 06/09/2020   LDLCALC 96 06/09/2020   TRIG 62 06/09/2020   CHOLHDL 2.5 06/09/2020     Controlled, no change in medication

## 2020-10-20 NOTE — Assessment & Plan Note (Signed)
Updated lab needed at/ before next visit.   

## 2020-10-20 NOTE — Assessment & Plan Note (Signed)
Hyperlipidemia:Low fat diet discussed and encouraged.   Lipid Panel  Lab Results  Component Value Date   CHOL 181 06/09/2020   HDL 73 06/09/2020   LDLCALC 96 06/09/2020   TRIG 62 06/09/2020   CHOLHDL 2.5 06/09/2020     Controlled, no change in medication

## 2020-10-20 NOTE — Assessment & Plan Note (Signed)
Deteriorated Starting wellness program  Patient re-educated about  the importance of commitment to a  minimum of 150 minutes of exercise per week as able.  The importance of healthy food choices with portion control discussed, as well as eating regularly and within a 12 hour window most days. The need to choose "clean , green" food 50 to 75% of the time is discussed, as well as to make water the primary drink and set a goal of 64 ounces water daily.    Weight /BMI 10/18/2020 08/30/2020 08/28/2020  WEIGHT 321 lb 316 lb 315 lb 0.6 oz  HEIGHT 5\' 2"  5\' 2"  5\' 2"   BMI 58.71 kg/m2 57.8 kg/m2 57.62 kg/m2

## 2020-10-20 NOTE — Assessment & Plan Note (Signed)
Controlled, no change in medication  

## 2020-10-26 ENCOUNTER — Other Ambulatory Visit (HOSPITAL_COMMUNITY): Payer: Self-pay

## 2020-10-26 ENCOUNTER — Ambulatory Visit: Payer: 59 | Admitting: Orthopedic Surgery

## 2020-10-26 MED FILL — Metformin HCl Tab 1000 MG: ORAL | 90 days supply | Qty: 180 | Fill #0 | Status: AC

## 2020-10-27 ENCOUNTER — Encounter (HOSPITAL_COMMUNITY): Payer: Self-pay | Admitting: *Deleted

## 2020-10-31 ENCOUNTER — Other Ambulatory Visit (HOSPITAL_COMMUNITY): Payer: Self-pay

## 2020-11-03 ENCOUNTER — Other Ambulatory Visit (HOSPITAL_COMMUNITY): Payer: Self-pay

## 2020-11-03 ENCOUNTER — Encounter: Payer: Self-pay | Admitting: Orthopedic Surgery

## 2020-11-03 ENCOUNTER — Other Ambulatory Visit: Payer: Self-pay

## 2020-11-03 ENCOUNTER — Ambulatory Visit: Payer: 59 | Admitting: Orthopedic Surgery

## 2020-11-03 VITALS — BP 142/83 | HR 84 | Ht 62.5 in | Wt 323.2 lb

## 2020-11-03 DIAGNOSIS — G8929 Other chronic pain: Secondary | ICD-10-CM

## 2020-11-03 DIAGNOSIS — M23321 Other meniscus derangements, posterior horn of medial meniscus, right knee: Secondary | ICD-10-CM

## 2020-11-03 MED ORDER — MELOXICAM 7.5 MG PO TABS
7.5000 mg | ORAL_TABLET | Freq: Every day | ORAL | 5 refills | Status: DC
  Filled 2020-11-03: qty 30, 30d supply, fill #0
  Filled 2020-12-14 – 2020-12-16 (×2): qty 30, 30d supply, fill #1

## 2020-11-03 NOTE — Progress Notes (Signed)
FOLLOW UP   Encounter Diagnoses  Name Primary?   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  She says it does not hurt all the time its worse depending on activity  I discussed this with her and asked her to try an anti-inflammatory for 30 consecutive days to see if that can control her knee pain if it can we can avoid surgery if not then we may be forced into arthroscopic surgery  She agreed when it is time for her follow-up if she cannot get here because of her job she will asked for virtual visit.  Meds ordered this encounter  Medications   meloxicam (MOBIC) 7.5 MG tablet    Sig: Take 1 tablet (7.5 mg total) by mouth daily.    Dispense:  30 tablet    Refill:  5

## 2020-12-05 ENCOUNTER — Other Ambulatory Visit: Payer: Self-pay

## 2020-12-05 ENCOUNTER — Ambulatory Visit (INDEPENDENT_AMBULATORY_CARE_PROVIDER_SITE_OTHER): Payer: 59 | Admitting: Orthopedic Surgery

## 2020-12-05 DIAGNOSIS — M23321 Other meniscus derangements, posterior horn of medial meniscus, right knee: Secondary | ICD-10-CM

## 2020-12-05 DIAGNOSIS — Z6841 Body Mass Index (BMI) 40.0 and over, adult: Secondary | ICD-10-CM

## 2020-12-05 DIAGNOSIS — G8929 Other chronic pain: Secondary | ICD-10-CM

## 2020-12-05 NOTE — Progress Notes (Signed)
SelectVirtual Visit via Telephone Note  I connected with [name] on 12/05/20 at 11:50 AM EDT by telephone and verified that I am speaking with the correct person using two identifiers.  Location: Patient: not in office  Provider: office    I discussed the limitations, risks, security and privacy concerns of performing an evaluation and management service by telephone and the availability of in person appointments. I also discussed with the patient that there may be a patient responsible charge related to this service. The patient expressed understanding and agreed to proceed.   History of Present Illness: 45 year old female chronic pain right knee has a meniscus tear  Recently started on meloxicam daily.  She notices a major improvement in pain although the knee clicks and pops more than it did before    Observations/Objective:   Assessment and Plan: Encounter Diagnoses  Name Primary?   Chronic pain of right knee Yes   Derangement of posterior horn of medial meniscus of right knee    Morbid obesity ([facility])    Body mass index 50.0-59.9, adult ([place])      Follow Up Instructions:    I discussed the assessment and treatment plan with the patient. The patient was provided an opportunity to ask questions and all were answered. The patient agreed with the plan and demonstrated an understanding of the instructions.   The patient was advised to call back or seek an in-person evaluation if the symptoms worsen or if the condition fails to improve as anticipated.  I provided 3 minutes of non-face-to-face time during this encounter.   Arther Abbott, MD

## 2020-12-14 ENCOUNTER — Other Ambulatory Visit: Payer: Self-pay

## 2020-12-14 MED FILL — Ergocalciferol Cap 1.25 MG (50000 Unit): ORAL | 84 days supply | Qty: 12 | Fill #1 | Status: CN

## 2020-12-16 ENCOUNTER — Other Ambulatory Visit: Payer: Self-pay

## 2020-12-16 ENCOUNTER — Other Ambulatory Visit (HOSPITAL_COMMUNITY): Payer: Self-pay

## 2020-12-16 MED FILL — Ergocalciferol Cap 1.25 MG (50000 Unit): ORAL | 84 days supply | Qty: 12 | Fill #1 | Status: AC

## 2021-01-04 ENCOUNTER — Other Ambulatory Visit (HOSPITAL_COMMUNITY): Payer: Self-pay

## 2021-01-04 ENCOUNTER — Other Ambulatory Visit: Payer: Self-pay | Admitting: Family Medicine

## 2021-01-04 MED ORDER — GLIPIZIDE ER 10 MG PO TB24
10.0000 mg | ORAL_TABLET | Freq: Every day | ORAL | 1 refills | Status: DC
  Filled 2021-01-04: qty 90, 90d supply, fill #0

## 2021-01-25 ENCOUNTER — Other Ambulatory Visit (HOSPITAL_COMMUNITY): Payer: Self-pay | Admitting: Family Medicine

## 2021-01-25 ENCOUNTER — Other Ambulatory Visit: Payer: Self-pay

## 2021-01-25 ENCOUNTER — Ambulatory Visit (HOSPITAL_COMMUNITY)
Admission: RE | Admit: 2021-01-25 | Discharge: 2021-01-25 | Disposition: A | Discharge status: Home / Self Care | Payer: 59 | Source: Ambulatory Visit | Attending: Family Medicine | Admitting: Family Medicine

## 2021-01-25 DIAGNOSIS — Z1231 Encounter for screening mammogram for malignant neoplasm of breast: Secondary | ICD-10-CM | POA: Diagnosis not present

## 2021-01-25 DIAGNOSIS — E1169 Type 2 diabetes mellitus with other specified complication: Secondary | ICD-10-CM | POA: Diagnosis not present

## 2021-01-25 DIAGNOSIS — E785 Hyperlipidemia, unspecified: Secondary | ICD-10-CM | POA: Diagnosis not present

## 2021-01-25 DIAGNOSIS — E559 Vitamin D deficiency, unspecified: Secondary | ICD-10-CM | POA: Diagnosis not present

## 2021-01-25 DIAGNOSIS — E669 Obesity, unspecified: Secondary | ICD-10-CM | POA: Diagnosis not present

## 2021-01-27 ENCOUNTER — Other Ambulatory Visit: Payer: Self-pay

## 2021-01-27 ENCOUNTER — Other Ambulatory Visit (HOSPITAL_COMMUNITY): Payer: Self-pay

## 2021-01-27 ENCOUNTER — Ambulatory Visit: Payer: 59 | Admitting: Family Medicine

## 2021-01-27 ENCOUNTER — Encounter: Payer: Self-pay | Admitting: Family Medicine

## 2021-01-27 VITALS — BP 123/75 | HR 76 | Resp 16 | Ht 63.0 in | Wt 322.8 lb

## 2021-01-27 DIAGNOSIS — M5432 Sciatica, left side: Secondary | ICD-10-CM | POA: Diagnosis not present

## 2021-01-27 DIAGNOSIS — K219 Gastro-esophageal reflux disease without esophagitis: Secondary | ICD-10-CM | POA: Diagnosis not present

## 2021-01-27 DIAGNOSIS — E669 Obesity, unspecified: Secondary | ICD-10-CM | POA: Diagnosis not present

## 2021-01-27 DIAGNOSIS — E559 Vitamin D deficiency, unspecified: Secondary | ICD-10-CM | POA: Diagnosis not present

## 2021-01-27 DIAGNOSIS — E785 Hyperlipidemia, unspecified: Secondary | ICD-10-CM

## 2021-01-27 DIAGNOSIS — E1169 Type 2 diabetes mellitus with other specified complication: Secondary | ICD-10-CM

## 2021-01-27 DIAGNOSIS — J309 Allergic rhinitis, unspecified: Secondary | ICD-10-CM | POA: Diagnosis not present

## 2021-01-27 LAB — CMP14+EGFR
ALT: 10 IU/L (ref 0–32)
AST: 12 IU/L (ref 0–40)
Albumin/Globulin Ratio: 1.6 (ref 1.2–2.2)
Albumin: 4.2 g/dL (ref 3.8–4.8)
Alkaline Phosphatase: 74 IU/L (ref 44–121)
BUN/Creatinine Ratio: 24 — ABNORMAL HIGH (ref 9–23)
BUN: 15 mg/dL (ref 6–24)
Bilirubin Total: <0.2 mg/dL (ref 0.0–1.2)
CO2: 21 mmol/L (ref 20–29)
Calcium: 9.2 mg/dL (ref 8.7–10.2)
Chloride: 106 mmol/L (ref 96–106)
Creatinine, Ser: 0.63 mg/dL (ref 0.57–1.00)
Globulin, Total: 2.7 g/dL (ref 1.5–4.5)
Glucose: 154 mg/dL — ABNORMAL HIGH (ref 70–99)
Potassium: 4.7 mmol/L (ref 3.5–5.2)
Sodium: 143 mmol/L (ref 134–144)
Total Protein: 6.9 g/dL (ref 6.0–8.5)
eGFR: 110 mL/min/{1.73_m2} (ref >59)

## 2021-01-27 LAB — LIPID PANEL
Chol/HDL Ratio: 2.8 ratio (ref 0.0–4.4)
Cholesterol, Total: 178 mg/dL (ref 100–199)
HDL: 63 mg/dL (ref >39)
LDL Chol Calc (NIH): 104 mg/dL — ABNORMAL HIGH (ref 0–99)
Triglycerides: 55 mg/dL (ref 0–149)
VLDL Cholesterol Cal: 11 mg/dL (ref 5–40)

## 2021-01-27 LAB — HEMOGLOBIN A1C
Est. average glucose Bld gHb Est-mCnc: 148 mg/dL
Hgb A1c MFr Bld: 6.8 % — ABNORMAL HIGH (ref 4.8–5.6)

## 2021-01-27 LAB — VITAMIN D 25 HYDROXY (VIT D DEFICIENCY, FRACTURES): Vit D, 25-Hydroxy: 21.4 ng/mL — ABNORMAL LOW (ref 30.0–100.0)

## 2021-01-27 MED ORDER — TIRZEPATIDE 5 MG/0.5ML ~~LOC~~ SOAJ
5.0000 mg | SUBCUTANEOUS | 0 refills | Status: DC
  Filled 2021-01-27 – 2021-03-15 (×2): qty 2, 28d supply, fill #0

## 2021-01-27 MED ORDER — GLIPIZIDE ER 2.5 MG PO TB24
2.5000 mg | ORAL_TABLET | Freq: Two times a day (BID) | ORAL | 0 refills | Status: DC
  Filled 2021-01-27: qty 180, 90d supply, fill #0

## 2021-01-27 MED ORDER — GLIPIZIDE ER 2.5 MG PO TB24
2.5000 mg | ORAL_TABLET | Freq: Two times a day (BID) | ORAL | 0 refills | Status: DC | PRN
  Filled 2021-01-27: qty 90, 45d supply, fill #0

## 2021-01-27 MED ORDER — TIRZEPATIDE 2.5 MG/0.5ML ~~LOC~~ SOAJ
2.5000 mg | SUBCUTANEOUS | 0 refills | Status: DC
  Filled 2021-01-27: qty 2, 28d supply, fill #0

## 2021-01-27 NOTE — Patient Instructions (Signed)
F/U week of Nov 21, call if you need me sooner  New is mounjaro once weekly for diabetes  Please give pt $25 coupon for [facility]  Please add iron and ferritin to recent lab, dx is IDA  Stop glipizide 10 mg  New is glipizide 2.5 mg tablet take ONCE daily IF NEEDED to  control blood sugar  MUST test every morning fasting blood sugar range goal is 80 to 130, send values once weekly  If needed can increase glipizide to two tablets once daily  Continue metformin as you are current ly taking it  New start  OTC vitamin D3, 5000 iU every day  Please send weekly blood sugar reports or concerns to me  Congrats on good labs  It is important that you exercise regularly at least 30 minutes 5 times a week. If you develop chest pain, have severe difficulty breathing, or feel very tired, stop exercising immediately and seek medical attention   Try to get a different work schedule so you vcan concentrate on your health  Thanks for choosing [facility], we consider it a privelige to serve you.

## 2021-01-27 NOTE — Assessment & Plan Note (Signed)
Increased but controlled symptoms

## 2021-01-27 NOTE — Assessment & Plan Note (Signed)
Controlled, no change in medication  

## 2021-01-27 NOTE — Progress Notes (Signed)
[name]     MRN: [id number]      DOB: 09/06/1975   HPI [name] is here for follow up and re-evaluation of chronic medical conditions, medication management and review of any available recent lab and radiology data.  Preventive health is updated, specifically  Cancer screening and Immunization.   Questions or concerns regarding consultations or procedures which the PT has had in the interim are  addressed. The PT denies any adverse reactions to current medications since the last visit.  There are no new concerns.  There are no specific complaints   ROS Denies recent fever or chills. Denies sinus pressure, nasal congestion, ear pain or sore throat. Denies chest congestion, productive cough or wheezing. Denies chest pains, palpitations and leg swelling Denies abdominal pain, nausea, vomiting,diarrhea or constipation.   Denies dysuria, frequency, hesitancy or incontinence. Denies  uncontrolled joint pain, swelling and limitation in mobility. Denies headaches, seizures, numbness, or tingling. Denies depression, anxiety or insomnia. Denies skin break down or rash.   PE  BP 123/75   Pulse 76   Resp 16   Ht 5\' 3"  (1.6 m)   Wt (!) 322 lb 12.8 oz (146.4 kg)   SpO2 96%   BMI 57.18 kg/m   Patient alert and oriented and in no cardiopulmonary distress.  HEENT: No facial asymmetry, EOMI,     Neck supple .  Chest: Clear to auscultation bilaterally.  CVS: S1, S2 no murmurs, no S3.Regular rate.  ABD: Soft non tender.   Ext: No edema  MS: Adequate ROM spine, shoulders, hips and reduced in right knee.  Skin: Intact, no ulcerations or rash noted.  Psych: Good eye contact, normal affect. Memory intact not anxious or depressed appearing.  CNS: CN 2-12 intact, power,  normal throughout.no focal deficits noted.   Assessment & Plan  Diabetes mellitus type 2 in obese Improved, change med tomounjaro, reduce dose of glipizide, hope is to lose weight and may not  Need the  g;lipizid so cautious titratio Weekly reports [name] is reminded of the importance of commitment to daily physical activity for 30 minutes or more, as able and the need to limit carbohydrate intake to 30 to 60 grams per meal to help with blood sugar control.   The need to take medication as prescribed, test blood sugar as directed, and to call between visits if there is a concern that blood sugar is uncontrolled is also discussed.   [name] is reminded of the importance of daily foot exam, annual eye examination, and good blood sugar, blood pressure and cholesterol control.  Diabetic Labs Latest Ref Rng & Units 01/25/2021 10/12/2020 06/09/2020 11/18/2019 11/18/2019  HbA1c 4.8 - 5.6 % 6.8(H) 7.0(H) 8.9(H) 6.5(A) 6.5  Microalbumin mg/dL - - - - -  Micro/Creat Ratio 0 - 29 mg/g creat - - 17 - -  Chol 100 - 199 mg/dL 178 - 181 167 -  HDL >39 mg/dL 63 - 73 63 -  Calc LDL 0 - 99 mg/dL 104(H) - 96 94 -  Triglycerides 0 - 149 mg/dL 55 - 62 47 -  Creatinine 0.57 - 1.00 mg/dL 0.63 0.61 0.70 0.60 -   BP/Weight 01/27/2021 11/03/2020 10/18/2020 08/30/2020 08/28/2020 06/23/2020 3/81/8299  Systolic BP 371 696 789 381 017 510 258  Diastolic BP 75 83 70 71 73 74 72  Wt. (Lbs) 322.8 323.2 321 316 315.04 315 307  BMI 57.18 58.17 58.71 57.8 57.62 57.61 56.15   Foot/eye exam completion dates Latest  Ref Rng & Units 10/18/2020 06/23/2020  Eye Exam No Retinopathy No Retinopathy -  Foot Form Completion - - Done        Hyperlipidemia LDL goal <100 Not fastinghowever advised to reduce fat intake Hyperlipidemia:Low fat diet discussed and encouraged.   Lipid Panel  Lab Results  Component Value Date   CHOL 178 01/25/2021   HDL 63 01/25/2021   LDLCALC 104 (H) 01/25/2021   TRIG 55 01/25/2021   CHOLHDL 2.8 01/25/2021       GERD Controlled, no change in medication   Allergic rhinitis Increased but controlled symptoms  Morbid obesity ([place])  Patient re-educated about  the importance of commitment to a   minimum of 150 minutes of exercise per week as able.  The importance of healthy food choices with portion control discussed, as well as eating regularly and within a 12 hour window most days. The need to choose "clean , green" food 50 to 75% of the time is discussed, as well as to make water the primary drink and set a goal of 64 ounces water daily.    Weight /BMI 01/27/2021 11/03/2020 10/18/2020  WEIGHT 322 lb 12.8 oz 323 lb 3.2 oz 321 lb  HEIGHT 5\' 3"  5' 2.5" 5\' 2"   BMI 57.18 kg/m2 58.17 kg/m2 58.71 kg/m2      Sciatica, left side No current flare  Vitamin D deficiency Not corrected, add daily vit D3 , 5000 IU

## 2021-01-27 NOTE — Assessment & Plan Note (Signed)
Improved, change med tomounjaro, reduce dose of glipizide, hope is to lose weight and may not  Need the g;lipizid so cautious titratio Weekly reports Ms. Trine is reminded of the importance of commitment to daily physical activity for 30 minutes or more, as able and the need to limit carbohydrate intake to 30 to 60 grams per meal to help with blood sugar control.   The need to take medication as prescribed, test blood sugar as directed, and to call between visits if there is a concern that blood sugar is uncontrolled is also discussed.   Ms. Estabrook is reminded of the importance of daily foot exam, annual eye examination, and good blood sugar, blood pressure and cholesterol control.  Diabetic Labs Latest Ref Rng & Units 01/25/2021 10/12/2020 06/09/2020 11/18/2019 11/18/2019  HbA1c 4.8 - 5.6 % 6.8(H) 7.0(H) 8.9(H) 6.5(A) 6.5  Microalbumin mg/dL - - - - -  Micro/Creat Ratio 0 - 29 mg/g creat - - 17 - -  Chol 100 - 199 mg/dL 178 - 181 167 -  HDL >39 mg/dL 63 - 73 63 -  Calc LDL 0 - 99 mg/dL 104(H) - 96 94 -  Triglycerides 0 - 149 mg/dL 55 - 62 47 -  Creatinine 0.57 - 1.00 mg/dL 0.63 0.61 0.70 0.60 -   BP/Weight 01/27/2021 11/03/2020 10/18/2020 08/30/2020 08/28/2020 06/23/2020 5/63/1497  Systolic BP 026 378 588 502 774 128 786  Diastolic BP 75 83 70 71 73 74 72  Wt. (Lbs) 322.8 323.2 321 316 315.04 315 307  BMI 57.18 58.17 58.71 57.8 57.62 57.61 56.15   Foot/eye exam completion dates Latest Ref Rng & Units 10/18/2020 06/23/2020  Eye Exam No Retinopathy No Retinopathy -  Foot Form Completion - - Done

## 2021-01-27 NOTE — Assessment & Plan Note (Signed)
Not corrected, add daily vit D3 , 5000 IU

## 2021-01-27 NOTE — Assessment & Plan Note (Signed)
  Patient re-educated about  the importance of commitment to a  minimum of 150 minutes of exercise per week as able.  The importance of healthy food choices with portion control discussed, as well as eating regularly and within a 12 hour window most days. The need to choose "clean , green" food 50 to 75% of the time is discussed, as well as to make water the primary drink and set a goal of 64 ounces water daily.    Weight /BMI 01/27/2021 11/03/2020 10/18/2020  WEIGHT 322 lb 12.8 oz 323 lb 3.2 oz 321 lb  HEIGHT 5\' 3"  5' 2.5" 5\' 2"   BMI 57.18 kg/m2 58.17 kg/m2 58.71 kg/m2

## 2021-01-27 NOTE — Assessment & Plan Note (Signed)
No current flare 

## 2021-01-27 NOTE — Assessment & Plan Note (Signed)
Not fastinghowever advised to reduce fat intake Hyperlipidemia:Low fat diet discussed and encouraged.   Lipid Panel  Lab Results  Component Value Date   CHOL 178 01/25/2021   HDL 63 01/25/2021   LDLCALC 104 (H) 01/25/2021   TRIG 55 01/25/2021   CHOLHDL 2.8 01/25/2021

## 2021-01-30 ENCOUNTER — Ambulatory Visit: Payer: 59 | Admitting: Family Medicine

## 2021-02-06 ENCOUNTER — Other Ambulatory Visit (HOSPITAL_COMMUNITY): Payer: Self-pay

## 2021-02-06 MED FILL — Metformin HCl Tab 1000 MG: ORAL | 90 days supply | Qty: 180 | Fill #1 | Status: AC

## 2021-02-08 ENCOUNTER — Encounter: Payer: Self-pay | Admitting: Family Medicine

## 2021-02-09 DIAGNOSIS — Z7984 Long term (current) use of oral hypoglycemic drugs: Secondary | ICD-10-CM | POA: Diagnosis not present

## 2021-02-09 DIAGNOSIS — E119 Type 2 diabetes mellitus without complications: Secondary | ICD-10-CM | POA: Diagnosis not present

## 2021-02-09 DIAGNOSIS — H16223 Keratoconjunctivitis sicca, not specified as Sjogren's, bilateral: Secondary | ICD-10-CM | POA: Diagnosis not present

## 2021-02-09 DIAGNOSIS — H524 Presbyopia: Secondary | ICD-10-CM | POA: Diagnosis not present

## 2021-02-09 DIAGNOSIS — H5213 Myopia, bilateral: Secondary | ICD-10-CM | POA: Diagnosis not present

## 2021-02-09 LAB — HM DIABETES EYE EXAM

## 2021-02-10 ENCOUNTER — Other Ambulatory Visit (HOSPITAL_COMMUNITY): Payer: Self-pay

## 2021-03-03 ENCOUNTER — Other Ambulatory Visit: Payer: Self-pay | Admitting: Family Medicine

## 2021-03-03 ENCOUNTER — Other Ambulatory Visit (HOSPITAL_COMMUNITY): Payer: Self-pay

## 2021-03-03 MED ORDER — MOUNJARO 2.5 MG/0.5ML ~~LOC~~ SOAJ
2.5000 mg | SUBCUTANEOUS | 0 refills | Status: DC
  Filled 2021-03-03: qty 2, 28d supply, fill #0

## 2021-03-15 ENCOUNTER — Other Ambulatory Visit (HOSPITAL_COMMUNITY): Payer: Self-pay

## 2021-03-17 ENCOUNTER — Other Ambulatory Visit (HOSPITAL_COMMUNITY): Payer: Self-pay

## 2021-03-17 ENCOUNTER — Encounter: Payer: Self-pay | Admitting: Family Medicine

## 2021-03-17 ENCOUNTER — Other Ambulatory Visit: Payer: Self-pay

## 2021-03-17 ENCOUNTER — Ambulatory Visit: Payer: 59 | Admitting: Family Medicine

## 2021-03-17 VITALS — BP 119/80 | HR 74 | Resp 15 | Ht 63.0 in | Wt 317.4 lb

## 2021-03-17 DIAGNOSIS — E1169 Type 2 diabetes mellitus with other specified complication: Secondary | ICD-10-CM | POA: Diagnosis not present

## 2021-03-17 DIAGNOSIS — Z23 Encounter for immunization: Secondary | ICD-10-CM | POA: Diagnosis not present

## 2021-03-17 DIAGNOSIS — E669 Obesity, unspecified: Secondary | ICD-10-CM

## 2021-03-17 DIAGNOSIS — E785 Hyperlipidemia, unspecified: Secondary | ICD-10-CM

## 2021-03-17 MED ORDER — TIRZEPATIDE 5 MG/0.5ML ~~LOC~~ SOAJ
5.0000 mg | SUBCUTANEOUS | 2 refills | Status: DC
Start: 2021-04-14 — End: 2022-01-23
  Filled 2021-03-17 – 2021-06-09 (×2): qty 6, 84d supply, fill #0
  Filled 2021-09-09: qty 6, 84d supply, fill #1
  Filled 2021-12-06: qty 2, 28d supply, fill #2

## 2021-03-17 NOTE — Progress Notes (Signed)
[name]     MRN: [id number]      DOB: [birth year]   HPI Ms. Canal is here for follow up and re-evaluation of chronic medical conditions, medication management and review of any available recent lab and radiology data. Specificslly f/u diabetes on new med mounjaro. No adverse s/e, does curb appetite, FBG seldom over 110 Preventive health is updated, specifically  Cancer screening and Immunization.   Questions or concerns regarding consultations or procedures which the PT has had in the interim are  addressed. The PT denies any adverse reactions to current medications since the last visit.  There are no new concerns.  There are no specific complaints   ROS Denies recent fever or chills. Denies sinus pressure, nasal congestion, ear pain or sore throat. Denies chest congestion, productive cough or wheezing. Denies chest pains, palpitations and leg swelling Denies abdominal pain, nausea, vomiting,diarrhea or constipation.   Denies dysuria, frequency, hesitancy or incontinence. Denies joint pain, swelling and limitation in mobility. Denies headaches, seizures, numbness, or tingling. Denies depression, anxiety or insomnia. Denies skin break down or rash.   PE  BP 119/80   Pulse 74   Resp 15   Ht 5\' 3"  (1.6 m)   Wt (!) 317 lb 6.4 oz (144 kg)   SpO2 98%   BMI 56.22 kg/m   Patient alert and oriented and in no cardiopulmonary distress.  HEENT: No facial asymmetry, EOMI,     Neck supple .  Chest: Clear to auscultation bilaterally.  CVS: S1, S2 no murmurs, no S3.Regular rate.  ABD: Soft non tender.   Ext: No edema  MS: Adequate ROM spine, shoulders, hips and knees.  Skin: Intact, no ulcerations or rash noted.  Psych: Good eye contact, normal affect. Memory intact not anxious or depressed appearing.  CNS: CN 2-12 intact, power,  normal throughout.no focal deficits noted.   Assessment & Plan  Diabetes mellitus type 2 in obese [name] is reminded of the  importance of commitment to daily physical activity for 30 minutes or more, as able and the need to limit carbohydrate intake to 30 to 60 grams per meal to help with blood sugar control.   The need to take medication as prescribed, test blood sugar as directed, and to call between visits if there is a concern that blood sugar is uncontrolled is also discussed.   [name] is reminded of the importance of daily foot exam, annual eye examination, and good blood sugar, blood pressure and cholesterol control.  Diabetic Labs Latest Ref Rng & Units 01/25/2021 10/12/2020 06/09/2020 11/18/2019 11/18/2019  HbA1c 4.8 - 5.6 % 6.8(H) 7.0(H) 8.9(H) 6.5(A) 6.5  Microalbumin mg/dL - - - - -  Micro/Creat Ratio 0 - 29 mg/g creat - - 17 - -  Chol 100 - 199 mg/dL 178 - 181 167 -  HDL >39 mg/dL 63 - 73 63 -  Calc LDL 0 - 99 mg/dL 104(H) - 96 94 -  Triglycerides 0 - 149 mg/dL 55 - 62 47 -  Creatinine 0.57 - 1.00 mg/dL 0.63 0.61 0.70 0.60 -   BP/Weight 03/17/2021 01/27/2021 11/03/2020 10/18/2020 08/30/2020 05/28/971 08/24/2990  Systolic BP 426 834 196 222 979 892 119  Diastolic BP 80 75 83 70 71 73 74  Wt. (Lbs) 317.4 322.8 323.2 321 316 315.04 315  BMI 56.22 57.18 58.17 58.71 57.8 57.62 57.61   Foot/eye exam completion dates Latest Ref Rng & Units 02/10/2021 10/18/2020  Eye Exam No Retinopathy No Retinopathy No  Retinopathy  Foot Form Completion - - -      Stop glipizide inc to  mounjaro 5 mg dose  Hyperlipidemia LDL goal <100 Non compliant with statin, encouraged 3 times weekly dosing Hyperlipidemia:Low fat diet discussed and encouraged.   Lipid Panel  Lab Results  Component Value Date   CHOL 178 01/25/2021   HDL 63 01/25/2021   LDLCALC 104 (H) 01/25/2021   TRIG 55 01/25/2021   CHOLHDL 2.8 01/25/2021       Morbid obesity ([place])  Patient re-educated about  the importance of commitment to a  minimum of 150 minutes of exercise per week as able.  The importance of healthy food choices with portion  control discussed, as well as eating regularly and within a 12 hour window most days. The need to choose "clean , green" food 50 to 75% of the time is discussed, as well as to make water the primary drink and set a goal of 64 ounces water daily.    Weight /BMI 03/17/2021 01/27/2021 11/03/2020  WEIGHT 317 lb 6.4 oz 322 lb 12.8 oz 323 lb 3.2 oz  HEIGHT 5\' 3"  5\' 3"  5' 2.5"  BMI 56.22 kg/m2 57.18 kg/m2 58.17 kg/m2

## 2021-03-17 NOTE — Patient Instructions (Addendum)
F/U end January, call if you need me before  Very good response to medication, keep this up  Stop glipizide , ciontinue morning testing, range is , 80 to 120  It is important that you exercise regularly at least 30 minutes 5 times a week. If you develop chest pain, have severe difficulty breathing, or feel very tired, stop exercising immediately and seek medical attention   Think about what you will eat, plan ahead. Choose " clean, green, fresh or frozen" over canned, processed or packaged foods which are more sugary, salty and fatty. 70 to 75% of food eaten should be vegetables and fruit. Three meals at set times with snacks allowed between meals, but they must be fruit or vegetables. Aim to eat over a 12 hour period , example 7 am to 7 pm, and STOP after  your last meal of the day. Drink water,generally about 64 ounces per day, no other drink is as healthy. Fruit juice is best enjoyed in a healthy way, by EATING the fruit. Pneumonia 20 today  Non fasting HBA1C, chem 7 and EGFR  5 days approx before next visit  Covid booster at pharmacy  Thanks for choosing [facility], we consider it a privelige to serve you.

## 2021-03-17 NOTE — Assessment & Plan Note (Signed)
Non compliant with statin, encouraged 3 times weekly dosing Hyperlipidemia:Low fat diet discussed and encouraged.   Lipid Panel  Lab Results  Component Value Date   CHOL 178 01/25/2021   HDL 63 01/25/2021   LDLCALC 104 (H) 01/25/2021   TRIG 55 01/25/2021   CHOLHDL 2.8 01/25/2021

## 2021-03-17 NOTE — Assessment & Plan Note (Signed)
Ms. Coryell is reminded of the importance of commitment to daily physical activity for 30 minutes or more, as able and the need to limit carbohydrate intake to 30 to 60 grams per meal to help with blood sugar control.   The need to take medication as prescribed, test blood sugar as directed, and to call between visits if there is a concern that blood sugar is uncontrolled is also discussed.   Ms. Crutchley is reminded of the importance of daily foot exam, annual eye examination, and good blood sugar, blood pressure and cholesterol control.  Diabetic Labs Latest Ref Rng & Units 01/25/2021 10/12/2020 06/09/2020 11/18/2019 11/18/2019  HbA1c 4.8 - 5.6 % 6.8(H) 7.0(H) 8.9(H) 6.5(A) 6.5  Microalbumin mg/dL - - - - -  Micro/Creat Ratio 0 - 29 mg/g creat - - 17 - -  Chol 100 - 199 mg/dL 178 - 181 167 -  HDL >39 mg/dL 63 - 73 63 -  Calc LDL 0 - 99 mg/dL 104(H) - 96 94 -  Triglycerides 0 - 149 mg/dL 55 - 62 47 -  Creatinine 0.57 - 1.00 mg/dL 0.63 0.61 0.70 0.60 -   BP/Weight 03/17/2021 01/27/2021 11/03/2020 10/18/2020 08/30/2020 11/23/7288 05/24/1153  Systolic BP 208 022 336 122 449 753 005  Diastolic BP 80 75 83 70 71 73 74  Wt. (Lbs) 317.4 322.8 323.2 321 316 315.04 315  BMI 56.22 57.18 58.17 58.71 57.8 57.62 57.61   Foot/eye exam completion dates Latest Ref Rng & Units 02/10/2021 10/18/2020  Eye Exam No Retinopathy No Retinopathy No Retinopathy  Foot Form Completion - - -      Stop glipizide inc to  mounjaro 5 mg dose

## 2021-03-17 NOTE — Assessment & Plan Note (Signed)
  Patient re-educated about  the importance of commitment to a  minimum of 150 minutes of exercise per week as able.  The importance of healthy food choices with portion control discussed, as well as eating regularly and within a 12 hour window most days. The need to choose "clean , green" food 50 to 75% of the time is discussed, as well as to make water the primary drink and set a goal of 64 ounces water daily.    Weight /BMI 03/17/2021 01/27/2021 11/03/2020  WEIGHT 317 lb 6.4 oz 322 lb 12.8 oz 323 lb 3.2 oz  HEIGHT 5\' 3"  5\' 3"  5' 2.5"  BMI 56.22 kg/m2 57.18 kg/m2 58.17 kg/m2

## 2021-03-24 ENCOUNTER — Other Ambulatory Visit (HOSPITAL_COMMUNITY): Payer: Self-pay

## 2021-04-05 ENCOUNTER — Other Ambulatory Visit: Payer: Self-pay | Admitting: Family Medicine

## 2021-04-05 ENCOUNTER — Other Ambulatory Visit (HOSPITAL_COMMUNITY): Payer: Self-pay

## 2021-04-05 MED ORDER — FLUTICASONE PROPIONATE 50 MCG/ACT NA SUSP
2.0000 | Freq: Every day | NASAL | 1 refills | Status: DC
  Filled 2021-04-05: qty 48, 90d supply, fill #0

## 2021-04-10 ENCOUNTER — Other Ambulatory Visit (HOSPITAL_COMMUNITY): Payer: Self-pay

## 2021-04-10 ENCOUNTER — Other Ambulatory Visit: Payer: Self-pay | Admitting: Family Medicine

## 2021-04-10 MED ORDER — MOUNJARO 5 MG/0.5ML ~~LOC~~ SOAJ
5.0000 mg | SUBCUTANEOUS | 0 refills | Status: DC
  Filled 2021-04-10: qty 2, 28d supply, fill #0

## 2021-05-02 ENCOUNTER — Other Ambulatory Visit (HOSPITAL_COMMUNITY): Payer: Self-pay

## 2021-05-05 ENCOUNTER — Other Ambulatory Visit: Payer: Self-pay | Admitting: Family Medicine

## 2021-05-05 ENCOUNTER — Other Ambulatory Visit (HOSPITAL_COMMUNITY): Payer: Self-pay

## 2021-05-05 MED ORDER — MOUNJARO 5 MG/0.5ML ~~LOC~~ SOAJ
5.0000 mg | SUBCUTANEOUS | 0 refills | Status: DC
  Filled 2021-05-05: qty 2, 28d supply, fill #0

## 2021-05-09 ENCOUNTER — Other Ambulatory Visit (HOSPITAL_COMMUNITY): Payer: Self-pay

## 2021-05-12 ENCOUNTER — Other Ambulatory Visit (HOSPITAL_COMMUNITY): Payer: Self-pay

## 2021-05-15 DIAGNOSIS — E669 Obesity, unspecified: Secondary | ICD-10-CM | POA: Diagnosis not present

## 2021-05-15 DIAGNOSIS — E1169 Type 2 diabetes mellitus with other specified complication: Secondary | ICD-10-CM | POA: Diagnosis not present

## 2021-05-16 LAB — BMP8+EGFR
BUN/Creatinine Ratio: 23 (ref 9–23)
BUN: 14 mg/dL (ref 6–24)
CO2: 24 mmol/L (ref 20–29)
Calcium: 9 mg/dL (ref 8.7–10.2)
Chloride: 106 mmol/L (ref 96–106)
Creatinine, Ser: 0.6 mg/dL (ref 0.57–1.00)
Glucose: 87 mg/dL (ref 70–99)
Potassium: 4.3 mmol/L (ref 3.5–5.2)
Sodium: 140 mmol/L (ref 134–144)
eGFR: 111 mL/min/{1.73_m2} (ref >59)

## 2021-05-16 LAB — HEMOGLOBIN A1C
Est. average glucose Bld gHb Est-mCnc: 126 mg/dL
Hgb A1c MFr Bld: 6 % — ABNORMAL HIGH (ref 4.8–5.6)

## 2021-05-19 ENCOUNTER — Ambulatory Visit: Payer: 59 | Admitting: Family Medicine

## 2021-05-19 ENCOUNTER — Other Ambulatory Visit: Payer: Self-pay

## 2021-05-19 ENCOUNTER — Other Ambulatory Visit (HOSPITAL_COMMUNITY): Payer: Self-pay

## 2021-05-19 ENCOUNTER — Encounter: Payer: Self-pay | Admitting: Family Medicine

## 2021-05-19 VITALS — BP 114/73 | HR 91 | Ht 62.0 in | Wt 303.0 lb

## 2021-05-19 DIAGNOSIS — K219 Gastro-esophageal reflux disease without esophagitis: Secondary | ICD-10-CM | POA: Diagnosis not present

## 2021-05-19 DIAGNOSIS — E669 Obesity, unspecified: Secondary | ICD-10-CM | POA: Diagnosis not present

## 2021-05-19 DIAGNOSIS — E785 Hyperlipidemia, unspecified: Secondary | ICD-10-CM | POA: Diagnosis not present

## 2021-05-19 DIAGNOSIS — J309 Allergic rhinitis, unspecified: Secondary | ICD-10-CM

## 2021-05-19 DIAGNOSIS — E1169 Type 2 diabetes mellitus with other specified complication: Secondary | ICD-10-CM | POA: Diagnosis not present

## 2021-05-19 MED ORDER — XHANCE 93 MCG/ACT NA EXHU
1.0000 | INHALANT_SUSPENSION | Freq: Two times a day (BID) | NASAL | 2 refills | Status: DC
  Filled 2021-05-19: qty 16, 30d supply, fill #0
  Filled 2021-10-18 – 2022-03-14 (×2): qty 16, 30d supply, fill #1

## 2021-05-19 MED ORDER — TIRZEPATIDE 5 MG/0.5ML ~~LOC~~ SOAJ
5.0000 mg | SUBCUTANEOUS | 3 refills | Status: DC
  Filled 2021-05-19 – 2022-01-05 (×2): qty 6, 84d supply, fill #0

## 2021-05-19 NOTE — Progress Notes (Signed)
[name]     MRN: [id number]      DOB: Feb 12, 1976   HPI [name] is here for follow up and re-evaluation of chronic medical conditions, medication management and review of any available recent lab and radiology data.  Preventive health is updated, specifically  Cancer screening and Immunization.   Questions or concerns regarding consultations or procedures which the PT has had in the interim are  addressed. The PT denies any adverse reactions to current medications since the last visit. Doing very well with mounjaro C/o itchy eyes and ears, not taking allergy med regularly Denies polyuria, polydipsia, blurred vision , or hypoglycemic episodes.    ROS Denies recent fever or chills. D Denies chest congestion, productive cough or wheezing. Denies chest pains, palpitations and leg swelling Denies abdominal pain, nausea, vomiting,diarrhea or constipation.   Denies dysuria, frequency, hesitancy or incontinence. Denies uncontrolled joint pain, swelling and limitation in mobility. Denies headaches, seizures, numbness, or tingling. Denies depression, anxiety or insomnia. Denies skin break down or rash.   PE  BP 114/73    Pulse 91    Ht 5\' 2"  (1.575 m)    Wt (!) 303 lb 0.6 oz (137.5 kg)    SpO2 93%    BMI 55.43 kg/m   Patient alert and oriented and in no cardiopulmonary distress.  HEENT: No facial asymmetry, EOMI,     Neck supple .Nasal stuffiness, no sinus tenderness, TM clear  Chest: Clear to auscultation bilaterally.  CVS: S1, S2 no murmurs, no S3.Regular rate.  ABD: Soft non tender.   Ext: No edema  MS: Adequate ROM spine, shoulders, hips and knees.  Skin: Intact, no ulcerations or rash noted.  Psych: Good eye contact, normal affect. Memory intact not anxious or depressed appearing.  CNS: CN 2-12 intact, power,  normal throughout.no focal deficits noted.   Assessment & Plan  Morbid obesity [facility])  Patient re-educated about  the importance of commitment to a   minimum of 150 minutes of exercise per week as able.  The importance of healthy food choices with portion control discussed, as well as eating regularly and within a 12 hour window most days. The need to choose "clean , green" food 50 to 75% of the time is discussed, as well as to make water the primary drink and set a goal of 64 ounces water daily.    Weight /BMI 05/19/2021 03/17/2021 01/27/2021  WEIGHT 303 lb 0.6 oz 317 lb 6.4 oz 322 lb 12.8 oz  HEIGHT 5\' 2"  5\' 3"  5\' 3"   BMI 55.43 kg/m2 56.22 kg/m2 57.18 kg/m2      Diabetes mellitus type 2 in obese Improved [name] is reminded of the importance of commitment to daily physical activity for 30 minutes or more, as able and the need to limit carbohydrate intake to 30 to 60 grams per meal to help with blood sugar control.   The need to take medication as prescribed, test blood sugar as directed, and to call between visits if there is a concern that blood sugar is uncontrolled is also discussed.   [name] is reminded of the importance of daily foot exam, annual eye examination, and good blood sugar, blood pressure and cholesterol control.  Diabetic Labs Latest Ref Rng & Units 05/15/2021 01/25/2021 10/12/2020 06/09/2020 11/18/2019  HbA1c 4.8 - 5.6 % 6.0(H) 6.8(H) 7.0(H) 8.9(H) 6.5(A)  Microalbumin mg/dL - - - - -  Micro/Creat Ratio 0 - 29 mg/g creat - - - 17 -  Chol 100 - 199 mg/dL - 178 - 181 167  HDL >39 mg/dL - 63 - 73 63  Calc LDL 0 - 99 mg/dL - 104(H) - 96 94  Triglycerides 0 - 149 mg/dL - 55 - 62 47  Creatinine 0.57 - 1.00 mg/dL 0.60 0.63 0.61 0.70 0.60   BP/Weight 05/19/2021 03/17/2021 01/27/2021 11/03/2020 10/18/2020 6/56/8127 08/21/6999  Systolic BP 749 449 675 916 384 665 993  Diastolic BP 73 80 75 83 70 71 73  Wt. (Lbs) 303.04 317.4 322.8 323.2 321 316 315.04  BMI 55.43 56.22 57.18 58.17 58.71 57.8 57.62   Foot/eye exam completion dates Latest Ref Rng & Units 02/09/2021 10/18/2020  Eye Exam No Retinopathy No Retinopathy No  Retinopathy  Foot Form Completion - - -  med adjustments made     Allergic rhinitis Uncontrolled, needs to commit to daily meds, xhance prescribed as new  Hyperlipidemia LDL goal <100 Hyperlipidemia:Low fat diet discussed and encouraged.   Lipid Panel  Lab Results  Component Value Date   CHOL 178 01/25/2021   HDL 63 01/25/2021   LDLCALC 104 (H) 01/25/2021   TRIG 55 01/25/2021   CHOLHDL 2.8 01/25/2021     Updated lab needed at/ before next visit. Needs to reduce fat in diet  GERD Controlled, no change in medication

## 2021-05-19 NOTE — Patient Instructions (Addendum)
F/u in 4 months, call if you need me sooner  Congrats on excellent weight loss and improved blood sugar keep it up.  Fasting lipid CMP and EGFR and hemoglobin A1c and microalbumin 5 days before next visit.  New for allergies is Truett Perna which is like Flonase if there is a copay please call so I can change the prescription.  Commit to daily cetirizine and you may use your eardrops as needed for itchy ears.  Please commit to exercise 30 minutes 5 days/week.  No other medication changes at this time.   Thanks for choosing [facility], we consider it a privelige to serve you.

## 2021-05-20 ENCOUNTER — Encounter: Payer: Self-pay | Admitting: Family Medicine

## 2021-05-20 NOTE — Assessment & Plan Note (Addendum)
Uncontrolled, needs to commit to daily meds, xhance prescribed as new

## 2021-05-20 NOTE — Assessment & Plan Note (Signed)
Improved [name] is reminded of the importance of commitment to daily physical activity for 30 minutes or more, as able and the need to limit carbohydrate intake to 30 to 60 grams per meal to help with blood sugar control.   The need to take medication as prescribed, test blood sugar as directed, and to call between visits if there is a concern that blood sugar is uncontrolled is also discussed.   [name] is reminded of the importance of daily foot exam, annual eye examination, and good blood sugar, blood pressure and cholesterol control.  Diabetic Labs Latest Ref Rng & Units 05/15/2021 01/25/2021 10/12/2020 06/09/2020 11/18/2019  HbA1c 4.8 - 5.6 % 6.0(H) 6.8(H) 7.0(H) 8.9(H) 6.5(A)  Microalbumin mg/dL - - - - -  Micro/Creat Ratio 0 - 29 mg/g creat - - - 17 -  Chol 100 - 199 mg/dL - 178 - 181 167  HDL >39 mg/dL - 63 - 73 63  Calc LDL 0 - 99 mg/dL - 104(H) - 96 94  Triglycerides 0 - 149 mg/dL - 55 - 62 47  Creatinine 0.57 - 1.00 mg/dL 0.60 0.63 0.61 0.70 0.60   BP/Weight 05/19/2021 03/17/2021 01/27/2021 11/03/2020 10/18/2020 3/47/4259 08/26/3873  Systolic BP 643 329 518 841 660 630 160  Diastolic BP 73 80 75 83 70 71 73  Wt. (Lbs) 303.04 317.4 322.8 323.2 321 316 315.04  BMI 55.43 56.22 57.18 58.17 58.71 57.8 57.62   Foot/eye exam completion dates Latest Ref Rng & Units 02/09/2021 10/18/2020  Eye Exam No Retinopathy No Retinopathy No Retinopathy  Foot Form Completion - - -  med adjustments made

## 2021-05-20 NOTE — Assessment & Plan Note (Signed)
Controlled, no change in medication  

## 2021-05-20 NOTE — Assessment & Plan Note (Signed)
°  Patient re-educated about  the importance of commitment to a  minimum of 150 minutes of exercise per week as able.  The importance of healthy food choices with portion control discussed, as well as eating regularly and within a 12 hour window most days. The need to choose "clean , green" food 50 to 75% of the time is discussed, as well as to make water the primary drink and set a goal of 64 ounces water daily.    Weight /BMI 05/19/2021 03/17/2021 01/27/2021  WEIGHT 303 lb 0.6 oz 317 lb 6.4 oz 322 lb 12.8 oz  HEIGHT 5\' 2"  5\' 3"  5\' 3"   BMI 55.43 kg/m2 56.22 kg/m2 57.18 kg/m2

## 2021-05-20 NOTE — Assessment & Plan Note (Signed)
Hyperlipidemia:Low fat diet discussed and encouraged.   Lipid Panel  Lab Results  Component Value Date   CHOL 178 01/25/2021   HDL 63 01/25/2021   LDLCALC 104 (H) 01/25/2021   TRIG 55 01/25/2021   CHOLHDL 2.8 01/25/2021     Updated lab needed at/ before next visit. Needs to reduce fat in diet

## 2021-05-22 ENCOUNTER — Other Ambulatory Visit (HOSPITAL_COMMUNITY): Payer: Self-pay

## 2021-05-29 ENCOUNTER — Other Ambulatory Visit (HOSPITAL_COMMUNITY): Payer: Self-pay

## 2021-05-29 MED FILL — Metformin HCl Tab 1000 MG: ORAL | 90 days supply | Qty: 180 | Fill #2 | Status: AC

## 2021-06-09 ENCOUNTER — Other Ambulatory Visit (HOSPITAL_COMMUNITY): Payer: Self-pay

## 2021-06-16 ENCOUNTER — Other Ambulatory Visit (HOSPITAL_COMMUNITY): Payer: Self-pay

## 2021-08-08 ENCOUNTER — Other Ambulatory Visit: Payer: Self-pay | Admitting: Family Medicine

## 2021-08-08 ENCOUNTER — Other Ambulatory Visit (HOSPITAL_COMMUNITY): Payer: Self-pay

## 2021-08-08 MED ORDER — PANTOPRAZOLE SODIUM 40 MG PO TBEC
40.0000 mg | DELAYED_RELEASE_TABLET | Freq: Every day | ORAL | 3 refills | Status: DC
  Filled 2021-08-08: qty 90, 90d supply, fill #0
  Filled 2021-10-18 – 2021-10-19 (×2): qty 90, 90d supply, fill #1
  Filled 2022-02-04: qty 90, 90d supply, fill #2
  Filled 2022-05-03: qty 90, 90d supply, fill #3

## 2021-09-09 ENCOUNTER — Other Ambulatory Visit: Payer: Self-pay | Admitting: Family Medicine

## 2021-09-11 ENCOUNTER — Other Ambulatory Visit (HOSPITAL_COMMUNITY): Payer: Self-pay

## 2021-09-11 MED ORDER — METFORMIN HCL 1000 MG PO TABS
1000.0000 mg | ORAL_TABLET | Freq: Two times a day (BID) | ORAL | 3 refills | Status: DC
  Filled 2021-09-11: qty 180, 90d supply, fill #0
  Filled 2021-10-18 – 2021-12-19 (×3): qty 180, 90d supply, fill #1

## 2021-09-15 DIAGNOSIS — E1169 Type 2 diabetes mellitus with other specified complication: Secondary | ICD-10-CM | POA: Diagnosis not present

## 2021-09-15 DIAGNOSIS — E669 Obesity, unspecified: Secondary | ICD-10-CM | POA: Diagnosis not present

## 2021-09-18 LAB — LIPID PANEL
Chol/HDL Ratio: 2.5 ratio (ref 0.0–4.4)
Cholesterol, Total: 145 mg/dL (ref 100–199)
HDL: 59 mg/dL (ref >39)
LDL Chol Calc (NIH): 75 mg/dL (ref 0–99)
Triglycerides: 51 mg/dL (ref 0–149)
VLDL Cholesterol Cal: 11 mg/dL (ref 5–40)

## 2021-09-18 LAB — CMP14+EGFR
ALT: 8 IU/L (ref 0–32)
AST: 13 IU/L (ref 0–40)
Albumin/Globulin Ratio: 1.6 (ref 1.2–2.2)
Albumin: 4.2 g/dL (ref 3.8–4.8)
Alkaline Phosphatase: 71 IU/L (ref 44–121)
BUN/Creatinine Ratio: 23 (ref 9–23)
BUN: 14 mg/dL (ref 6–24)
Bilirubin Total: 0.3 mg/dL (ref 0.0–1.2)
CO2: 23 mmol/L (ref 20–29)
Calcium: 8.8 mg/dL (ref 8.7–10.2)
Chloride: 105 mmol/L (ref 96–106)
Creatinine, Ser: 0.62 mg/dL (ref 0.57–1.00)
Globulin, Total: 2.6 g/dL (ref 1.5–4.5)
Glucose: 91 mg/dL (ref 70–99)
Potassium: 4.1 mmol/L (ref 3.5–5.2)
Sodium: 141 mmol/L (ref 134–144)
Total Protein: 6.8 g/dL (ref 6.0–8.5)
eGFR: 110 mL/min/{1.73_m2} (ref >59)

## 2021-09-18 LAB — MICROALBUMIN / CREATININE URINE RATIO
Creatinine, Urine: 211.4 mg/dL
Microalb/Creat Ratio: 5 mg/g creat (ref 0–29)
Microalbumin, Urine: 11.4 ug/mL

## 2021-09-18 LAB — HEMOGLOBIN A1C
Est. average glucose Bld gHb Est-mCnc: 126 mg/dL
Hgb A1c MFr Bld: 6 % — ABNORMAL HIGH (ref 4.8–5.6)

## 2021-09-19 ENCOUNTER — Other Ambulatory Visit (HOSPITAL_COMMUNITY): Payer: Self-pay

## 2021-09-19 ENCOUNTER — Encounter: Payer: Self-pay | Admitting: Family Medicine

## 2021-09-19 ENCOUNTER — Ambulatory Visit: Payer: 59 | Admitting: Family Medicine

## 2021-09-19 VITALS — BP 116/76 | HR 74 | Ht 62.0 in | Wt 302.1 lb

## 2021-09-19 DIAGNOSIS — K219 Gastro-esophageal reflux disease without esophagitis: Secondary | ICD-10-CM | POA: Diagnosis not present

## 2021-09-19 DIAGNOSIS — F411 Generalized anxiety disorder: Secondary | ICD-10-CM | POA: Insufficient documentation

## 2021-09-19 DIAGNOSIS — E1169 Type 2 diabetes mellitus with other specified complication: Secondary | ICD-10-CM

## 2021-09-19 DIAGNOSIS — E785 Hyperlipidemia, unspecified: Secondary | ICD-10-CM | POA: Diagnosis not present

## 2021-09-19 DIAGNOSIS — H6692 Otitis media, unspecified, left ear: Secondary | ICD-10-CM

## 2021-09-19 DIAGNOSIS — E669 Obesity, unspecified: Secondary | ICD-10-CM

## 2021-09-19 DIAGNOSIS — J309 Allergic rhinitis, unspecified: Secondary | ICD-10-CM | POA: Diagnosis not present

## 2021-09-19 MED ORDER — AZITHROMYCIN 250 MG PO TABS
ORAL_TABLET | ORAL | 0 refills | Status: AC
Start: 2021-09-19 — End: 2021-09-25
  Filled 2021-09-19: qty 6, 5d supply, fill #0

## 2021-09-19 NOTE — Assessment & Plan Note (Signed)
Uncontrolled, double zyrtec and take sudafed as needed

## 2021-09-19 NOTE — Progress Notes (Signed)
[name]     MRN: [id number]      DOB: 07/13/1975   HPI [name] is here for follow up and re-evaluation of chronic medical conditions, medication management and review of any available recent lab and radiology data.  Preventive health is updated, specifically  Cancer screening and Immunization.   Questions or concerns regarding consultations or procedures which the PT has had in the interim are  addressed. The PT denies any adverse reactions to current medications since the last visit.  C/o bilateral ear pressure, left worse than right also increased and uncontrolled allergies esp with weather change   Good appetite suppression with no constipation or  nausea. Needs to start regular exercise  ROS Denies recent fever or chills. Denies chest congestion, productive cough or wheezing. Denies chest pains, palpitations and leg swelling Denies abdominal pain, nausea, vomiting,diarrhea or constipation.   Denies dysuria, frequency, hesitancy or incontinence. Denies uncontrolled joint pain, swelling and limitation in mobility. Denies headaches, seizures, numbness, or tingling. Denies depression, anxiety or insomnia. Denies skin break down or rash.   PE  BP 116/76   Pulse 74   Ht '5\' 2"'$  (1.575 m)   Wt (!) 302 lb 1.3 oz (137 kg)   SpO2 97%   BMI 55.25 kg/m   Patient alert and oriented and in no cardiopulmonary distress.  HEENT: No facial asymmetry, EOMI,     Neck supple .  Chest: Clear to auscultation bilaterally.  CVS: S1, S2 no murmurs, no S3.Regular rate.  ABD: Soft non tender.   Ext: No edema  MS: Adequate ROM spine, shoulders, hips and knees.  Skin: Intact, no ulcerations or rash noted.  Psych: Good eye contact, normal affect. Memory intact not anxious or depressed appearing.  CNS: CN 2-12 intact, power,  normal throughout.no focal deficits noted.   Assessment & Plan  Left otitis media Z pack  Morbid obesity ([place])  Patient re-educated about  the  importance of commitment to a  minimum of 150 minutes of exercise per week as able.  The importance of healthy food choices with portion control discussed, as well as eating regularly and within a 12 hour window most days. The need to choose "clean , green" food 50 to 75% of the time is discussed, as well as to make water the primary drink and set a goal of 64 ounces water daily.       09/19/2021   11:05 AM 05/19/2021   11:33 AM 03/17/2021    4:08 PM  Weight /BMI  Weight 302 lb 1.3 oz 303 lb 0.6 oz 317 lb 6.4 oz  Height '5\' 2"'$  (1.575 m) '5\' 2"'$  (1.575 m) '5\' 3"'$  (1.6 m)  BMI 55.25 kg/m2 55.43 kg/m2 56.22 kg/m2      Dyslipidemia Hyperlipidemia:Low fat diet discussed and encouraged.   Lipid Panel  Lab Results  Component Value Date   CHOL 145 09/15/2021   HDL 59 09/15/2021   LDLCALC 75 09/15/2021   TRIG 51 09/15/2021   CHOLHDL 2.5 09/15/2021     Controlled, no change in medication   Diabetes mellitus type 2 in obese Controlled, no change in medication [name] is reminded of the importance of commitment to daily physical activity for 30 minutes or more, as able and the need to limit carbohydrate intake to 30 to 60 grams per meal to help with blood sugar control.   The need to take medication as prescribed, test blood sugar as directed, and to call between visits if  there is a concern that blood sugar is uncontrolled is also discussed.   [name] is reminded of the importance of daily foot exam, annual eye examination, and good blood sugar, blood pressure and cholesterol control.     Latest Ref Rng & Units 09/15/2021    1:13 PM 05/15/2021   11:56 AM 01/25/2021    4:14 PM 10/12/2020    9:52 AM 10/12/2020    8:42 AM  Diabetic Labs  HbA1c 4.8 - 5.6 % 6.0   6.0   6.8    7.0    Micro/Creat Ratio 0 - 29 mg/g creat 5        Chol 100 - 199 mg/dL 145    178      HDL >39 mg/dL 59    63      Calc LDL 0 - 99 mg/dL 75    104      Triglycerides 0 - 149 mg/dL 51    55      Creatinine  0.57 - 1.00 mg/dL 0.62   0.60   0.63   0.61         09/19/2021   11:05 AM 05/19/2021   11:33 AM 03/17/2021    4:08 PM 01/27/2021    1:16 PM 11/03/2020    3:06 PM 10/18/2020    9:51 AM 08/30/2020   10:59 AM  BP/Weight  Systolic BP 902 409 735 329 924 268 341  Diastolic BP 76 73 80 75 83 70 71  Wt. (Lbs) 302.08 303.04 317.4 322.8 323.2 321 316  BMI 55.25 kg/m2 55.43 kg/m2 56.22 kg/m2 57.18 kg/m2 58.17 kg/m2 58.71 kg/m2 57.8 kg/m2      Latest Ref Rng & Units 02/09/2021   12:00 AM 10/18/2020   12:00 AM  Foot/eye exam completion dates  Eye Exam No Retinopathy No Retinopathy      No Retinopathy          This result is from an external source.        Allergic rhinitis Uncontrolled, double zyrtec and take sudafed as needed  GERD Score of 7 but at times finds anxiety overwhelming

## 2021-09-19 NOTE — Assessment & Plan Note (Signed)
Controlled, no change in medication [name] is reminded of the importance of commitment to daily physical activity for 30 minutes or more, as able and the need to limit carbohydrate intake to 30 to 60 grams per meal to help with blood sugar control.   The need to take medication as prescribed, test blood sugar as directed, and to call between visits if there is a concern that blood sugar is uncontrolled is also discussed.   [name] is reminded of the importance of daily foot exam, annual eye examination, and good blood sugar, blood pressure and cholesterol control.     Latest Ref Rng & Units 09/15/2021    1:13 PM 05/15/2021   11:56 AM 01/25/2021    4:14 PM 10/12/2020    9:52 AM 10/12/2020    8:42 AM  Diabetic Labs  HbA1c 4.8 - 5.6 % 6.0   6.0   6.8    7.0    Micro/Creat Ratio 0 - 29 mg/g creat 5        Chol 100 - 199 mg/dL 145    178      HDL >39 mg/dL 59    63      Calc LDL 0 - 99 mg/dL 75    104      Triglycerides 0 - 149 mg/dL 51    55      Creatinine 0.57 - 1.00 mg/dL 0.62   0.60   0.63   0.61         09/19/2021   11:05 AM 05/19/2021   11:33 AM 03/17/2021    4:08 PM 01/27/2021    1:16 PM 11/03/2020    3:06 PM 10/18/2020    9:51 AM 08/30/2020   10:59 AM  BP/Weight  Systolic BP 710 626 948 546 270 350 093  Diastolic BP 76 73 80 75 83 70 71  Wt. (Lbs) 302.08 303.04 317.4 322.8 323.2 321 316  BMI 55.25 kg/m2 55.43 kg/m2 56.22 kg/m2 57.18 kg/m2 58.17 kg/m2 58.71 kg/m2 57.8 kg/m2      Latest Ref Rng & Units 02/09/2021   12:00 AM 10/18/2020   12:00 AM  Foot/eye exam completion dates  Eye Exam No Retinopathy No Retinopathy      No Retinopathy          This result is from an external source.

## 2021-09-19 NOTE — Assessment & Plan Note (Signed)
Hyperlipidemia:Low fat diet discussed and encouraged.   Lipid Panel  Lab Results  Component Value Date   CHOL 145 09/15/2021   HDL 59 09/15/2021   LDLCALC 75 09/15/2021   TRIG 51 09/15/2021   CHOLHDL 2.5 09/15/2021     Controlled, no change in medication

## 2021-09-19 NOTE — Assessment & Plan Note (Signed)
Zpack

## 2021-09-19 NOTE — Assessment & Plan Note (Signed)
Score of 7 but at times finds anxiety overwhelming

## 2021-09-19 NOTE — Assessment & Plan Note (Signed)
  Patient re-educated about  the importance of commitment to a  minimum of 150 minutes of exercise per week as able.  The importance of healthy food choices with portion control discussed, as well as eating regularly and within a 12 hour window most days. The need to choose "clean , green" food 50 to 75% of the time is discussed, as well as to make water the primary drink and set a goal of 64 ounces water daily.       09/19/2021   11:05 AM 05/19/2021   11:33 AM 03/17/2021    4:08 PM  Weight /BMI  Weight 302 lb 1.3 oz 303 lb 0.6 oz 317 lb 6.4 oz  Height '5\' 2"'$  (1.575 m) '5\' 2"'$  (1.575 m) '5\' 3"'$  (1.6 m)  BMI 55.25 kg/m2 55.43 kg/m2 56.22 kg/m2

## 2021-09-19 NOTE — Patient Instructions (Addendum)
F/u in 4 months, call if you need me sooner, with foot exam AND GLYCOHB  You are being referred to Therapist  Please schedule and  get your pap  CONGRATS on great labs  Take 2 zyrtecs on days when allergies ARE BAD  AZITHROMYCIN IS PRESCRIBED FOR LEFT EAR   YOU MAY TAKE SUDAFED ONE DAILY AS  NEEDED, FOR EXCESS EAR PRESSURE  It is important that you exercise regularly at least 30 minutes 5 times a week. If you develop chest pain, have severe difficulty breathing, or feel very tired, stop exercising immediately and seek medical attention   Thanks for choosing [facility], we consider it a privelige to serve you.

## 2021-10-18 ENCOUNTER — Other Ambulatory Visit (HOSPITAL_COMMUNITY): Payer: Self-pay

## 2021-10-19 ENCOUNTER — Other Ambulatory Visit (HOSPITAL_COMMUNITY): Payer: Self-pay

## 2021-10-26 ENCOUNTER — Other Ambulatory Visit (HOSPITAL_COMMUNITY): Payer: Self-pay

## 2021-10-27 ENCOUNTER — Encounter (HOSPITAL_COMMUNITY): Payer: Self-pay | Admitting: *Deleted

## 2021-11-22 DIAGNOSIS — Z9884 Bariatric surgery status: Secondary | ICD-10-CM | POA: Diagnosis not present

## 2021-12-07 ENCOUNTER — Ambulatory Visit: Payer: 59 | Admitting: Orthopedic Surgery

## 2021-12-07 ENCOUNTER — Other Ambulatory Visit (HOSPITAL_COMMUNITY): Payer: Self-pay

## 2021-12-07 ENCOUNTER — Ambulatory Visit (INDEPENDENT_AMBULATORY_CARE_PROVIDER_SITE_OTHER): Payer: 59

## 2021-12-07 ENCOUNTER — Encounter: Payer: Self-pay | Admitting: Orthopedic Surgery

## 2021-12-07 VITALS — BP 129/84 | HR 73 | Ht 62.5 in | Wt 306.0 lb

## 2021-12-07 DIAGNOSIS — M7542 Impingement syndrome of left shoulder: Secondary | ICD-10-CM | POA: Diagnosis not present

## 2021-12-07 DIAGNOSIS — M25512 Pain in left shoulder: Secondary | ICD-10-CM

## 2021-12-07 DIAGNOSIS — G8929 Other chronic pain: Secondary | ICD-10-CM | POA: Diagnosis not present

## 2021-12-07 MED ORDER — METHYLPREDNISOLONE ACETATE 40 MG/ML IJ SUSP
40.0000 mg | Freq: Once | INTRAMUSCULAR | Status: AC
  Administered 2021-12-07: 40 mg via INTRA_ARTICULAR

## 2021-12-07 NOTE — Addendum Note (Signed)
Addended byCandice Camp on: 12/07/2021 03:54 PM   Modules accepted: Orders

## 2021-12-07 NOTE — Progress Notes (Signed)
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  Pain located along the anterolateral shoulder associated with abduction less so with flexion.  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  General appearance: Well-developed well-nourished no gross deformities  Cardiovascular normal pulse and perfusion normal color without edema  Neurologically no sensation loss or deficits or pathologic reflexes  Psychological: Awake alert and oriented x3 mood and affect normal  Skin no lacerations or ulcerations no nodularity no palpable masses, no erythema or nodularity  Musculoskeletal: There is tenderness anterolaterally along the deltoid along the left shoulder.  Appears to have mild impingement at 150 degrees of flexion.  Has some weakness in abduction less so in flexion  Imaging concerning proximal migration of the humerus break in [place]'s line no glenohumeral arthritis  A/P  Possible rotator cuff pathology in this 46 year old with no history of trauma but x-ray showing proximal migration of the humerus this could be due to weakness or chronic rotator cuff disease although  no prior symptoms  Recommend home exercise program and subacromial injection oral OTCs as needed  Procedure note the subacromial injection shoulder left   Verbal consent was obtained to inject the  Left   Shoulder  Timeout was completed to confirm the injection site is a subacromial space of the  left  shoulder  Medication used Depo-Medrol 40 mg and lidocaine 1% 3 cc  Anesthesia was provided by ethyl chloride  The injection was performed in the left  posterior subacromial space. After pinning the skin with alcohol and anesthetized the skin with ethyl chloride the subacromial space was injected using a 20-gauge needle. There were no complications  Sterile dressing was applied.

## 2021-12-20 ENCOUNTER — Other Ambulatory Visit (HOSPITAL_COMMUNITY): Payer: Self-pay

## 2022-01-04 ENCOUNTER — Encounter: Payer: Self-pay | Admitting: Orthopedic Surgery

## 2022-01-04 ENCOUNTER — Ambulatory Visit: Payer: 59 | Admitting: Orthopedic Surgery

## 2022-01-04 DIAGNOSIS — M25512 Pain in left shoulder: Secondary | ICD-10-CM

## 2022-01-04 DIAGNOSIS — M7542 Impingement syndrome of left shoulder: Secondary | ICD-10-CM | POA: Diagnosis not present

## 2022-01-04 DIAGNOSIS — G8929 Other chronic pain: Secondary | ICD-10-CM | POA: Diagnosis not present

## 2022-01-04 NOTE — Patient Instructions (Signed)
While we are working on your approval for MRI please go ahead and call to schedule your appointment with [facility] within at least one (1) week.   Central Scheduling [phone]   Physical therapy has been ordered for you at [facility]. They should call you to schedule, [phone] is the phone number to call, if you want to call to schedule.

## 2022-01-04 NOTE — Progress Notes (Signed)
Encounter Diagnoses  Name Primary?   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  No history of trauma.  Patient received injection subacromial space seem to get better for a little while and then the pain came back in the anterolateral shoulder and then periscapular pain was also noted  Exam shows tenderness in the periscapular region in the region of perhaps a trigger point and then she still has painful range of motion at 90 degrees and 120 degrees of flexion with pain on cuff testing and what is mild weakness or pain related weakness difficult to tell  Recommend physical therapy to address the trigger point, specifically try dry needling  Also MRI recommended to evaluate rotator cuff for tear

## 2022-01-05 ENCOUNTER — Other Ambulatory Visit (HOSPITAL_COMMUNITY): Payer: Self-pay

## 2022-01-10 ENCOUNTER — Encounter (HOSPITAL_COMMUNITY): Payer: Self-pay

## 2022-01-10 ENCOUNTER — Telehealth: Payer: Self-pay

## 2022-01-10 ENCOUNTER — Ambulatory Visit (HOSPITAL_COMMUNITY): Payer: 59 | Admitting: Occupational Therapy

## 2022-01-10 ENCOUNTER — Other Ambulatory Visit: Payer: Self-pay | Admitting: Orthopedic Surgery

## 2022-01-10 DIAGNOSIS — G8929 Other chronic pain: Secondary | ICD-10-CM

## 2022-01-10 DIAGNOSIS — M25512 Pain in left shoulder: Secondary | ICD-10-CM

## 2022-01-10 DIAGNOSIS — M7542 Impingement syndrome of left shoulder: Secondary | ICD-10-CM

## 2022-01-10 NOTE — Telephone Encounter (Signed)
I did that then they sent another message to change it back to OT. Im not sure what they need but I reopened the OT so now there is OT and PT open they can pick which one to use.

## 2022-01-10 NOTE — Telephone Encounter (Signed)
Nancy left message stating that Dr. Aline Brochure needs to change order to PT instead of OT since patient is going to be seen for dry needling for her shoulder.

## 2022-01-12 ENCOUNTER — Ambulatory Visit
Admission: RE | Admit: 2022-01-12 | Discharge: 2022-01-12 | Disposition: A | Discharge status: Home / Self Care | Payer: 59 | Source: Ambulatory Visit | Attending: Orthopedic Surgery | Admitting: Orthopedic Surgery

## 2022-01-12 DIAGNOSIS — M7542 Impingement syndrome of left shoulder: Secondary | ICD-10-CM | POA: Diagnosis not present

## 2022-01-12 DIAGNOSIS — G8929 Other chronic pain: Secondary | ICD-10-CM | POA: Diagnosis not present

## 2022-01-12 DIAGNOSIS — M25512 Pain in left shoulder: Secondary | ICD-10-CM | POA: Diagnosis not present

## 2022-01-16 ENCOUNTER — Ambulatory Visit (HOSPITAL_COMMUNITY): Payer: 59 | Attending: Orthopedic Surgery | Admitting: Physical Therapy

## 2022-01-16 DIAGNOSIS — G8929 Other chronic pain: Secondary | ICD-10-CM | POA: Insufficient documentation

## 2022-01-16 DIAGNOSIS — M7542 Impingement syndrome of left shoulder: Secondary | ICD-10-CM | POA: Diagnosis not present

## 2022-01-16 DIAGNOSIS — M25512 Pain in left shoulder: Secondary | ICD-10-CM | POA: Insufficient documentation

## 2022-01-16 NOTE — Patient Instructions (Signed)

## 2022-01-16 NOTE — Therapy (Signed)
OUTPATIENT PHYSICAL THERAPY SHOULDER EVALUATION   Patient Name: [name] MRN: [id number] DOB:Jan 09, 1976, 46 y.o., female Today's Date: 01/16/2022   PT End of Session - 01/16/22 1657     Visit Number 1    Number of Visits 12    Date for PT Re-Evaluation 02/27/22    Authorization Type [employer]    Progress Note Due on Visit 10    PT Start Time 1605    PT Stop Time 0998    PT Time Calculation (min) 45 min    Activity Tolerance Patient tolerated treatment well    Behavior During Therapy [facility] for tasks assessed/performed             Past Medical History:  Diagnosis Date   Allergy    Anemia    Arthritis    Diabetes mellitus    type II    Educated about COVID-19 virus infection 09/07/2018   Family history of colonic polyps    GERD (gastroesophageal reflux disease)    Iron deficiency anemia 09/03/2007   Qualifier: Diagnosis of  By: Rennie Plowman     PONV (postoperative nausea and vomiting)    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   Past Surgical History:  Procedure Laterality Date   CARDIAC CATHETERIZATION     CESAREAN SECTION     x 2   COLONOSCOPY WITH PROPOFOL N/A 04/20/2019   Procedure: COLONOSCOPY WITH PROPOFOL;  Surgeon: Thornton Park, MD;  Location: [facility];  Service: Gastroenterology;  Laterality: N/A;   LAPAROSCOPIC GASTRIC SLEEVE RESECTION N/A 04/02/2016   Procedure: LAPAROSCOPIC GASTRIC SLEEVE RESECTION, UPPER ENDO;  Surgeon: Johnathan Hausen, MD;  Location: [facility];  Service: General;  Laterality: N/A;   POLYPECTOMY  04/20/2019   Procedure: POLYPECTOMY;  Surgeon: Thornton Park, MD;  Location: [facility];  Service: Gastroenterology;;   Patient Active Problem List   Diagnosis Date Noted   GAD (generalized anxiety disorder) 09/19/2021   Sciatica, left side 08/30/2020   Knee pain, right 05/21/2019   Adenomatous polyp of ascending colon    Dyslipidemia 09/07/2018   Left otitis media 10/21/2017   Morbid obesity ([place])  09/12/2015   Vitamin B 12 deficiency 11/22/2011   Vitamin D deficiency 11/22/2011   Diabetes mellitus type 2 in obese 09/03/2007   Hyperlipidemia LDL goal <100 09/03/2007   Allergic rhinitis 09/03/2007   GERD 09/03/2007    PCP: Tula Nakayama MD  REFERRING PROVIDER: Carole Civil, MD   REFERRING DIAG: [phone] (ICD-10-CM) - Chronic left shoulder pain M75.42 (ICD-10-CM) - Impingement syndrome of left shoulder M25.512 (ICD-10-CM) - Trigger point of left shoulder region   THERAPY DIAG:  Left shoulder pain, unspecified chronicity  Rationale for Evaluation and Treatment Rehabilitation  ONSET DATE: About a month   SUBJECTIVE:  SUBJECTIVE STATEMENT: Patient presents to therapy with complaint of LT shoulder pain beginning about 1 month ago. No known MOI. She had recent MRI which showed tendinosis. She had steroid injection which helped for about a week, but noting more pain in neck and LT arm following.   PERTINENT HISTORY: NA  PAIN:  Are you having pain? Yes: NPRS scale: 4/10 Pain location: Lt shoulder  Pain description: tingling, burning, aching Aggravating factors: Reaching back, reaching outward, reaching across body  Relieving factors: Rest, meds   PRECAUTIONS: None  WEIGHT BEARING RESTRICTIONS No  FALLS:  Has patient fallen in last 6 months? 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 Mild tendinosis of the supraspinatus tendon with irregularity along the anterior bursal surface. 2. Mild  tendinosis of the infraspinatus tendon. 3. Mild tendinosis of the subscapularis tendon.    PATIENT SURVEYS:  FOTO 62% function   COGNITION:  Overall cognitive status: Within functional limits for tasks assessed     SENSATION: WFL  POSTURE: Rounded shoulder   UPPER EXTREMITY ROM:   Bilateral shoulder AROM WFL but increased pain with LT shoulder IR, ER and abduction   UPPER EXTREMITY MMT:  MMT Right eval Left eval  Shoulder flexion 5 5  Shoulder extension    Shoulder abduction 5 4*  Shoulder adduction    Shoulder internal rotation 5 4*  Shoulder external rotation 5 4+  Middle trapezius    Lower trapezius    Elbow flexion    Elbow extension    Wrist flexion    Wrist extension    Wrist ulnar deviation    Wrist radial deviation    Wrist pronation    Wrist supination    Grip strength (lbs)    (Blank rows = not tested)  SHOULDER SPECIAL TESTS:  Impingement tests: Neer impingement test: positive , Hawkins/Kennedy impingement test: positive , and Painful arc test: positive   PALPATION:  Mod TTP about LT upper trap, posterolateral deltoid    TODAY'S TREATMENT:  Eval UT stretch Scap retraction Seated shoulder ER RTB    PATIENT EDUCATION: Education details: on eval findings, POC and HEP  Person educated: Patient Education method: Explanation Education comprehension: verbalized understanding   HOME EXERCISE PROGRAM: Access Code: [email or web address] URL: [email or web address] Date: 01/16/2022 Prepared by: Josue Hector  Exercises - Seated Scapular Retraction  - 2-3 x daily - 7 x weekly - 2 sets - 10 reps - 3-5 second hold - Seated Upper Trapezius Stretch  - 2-3 x daily - 7 x weekly - 1 sets - 4 reps - 20 second  hold - Shoulder External Rotation and Scapular Retraction with Resistance  - 2-3 x daily - 7 x weekly - 2 sets - 10 reps - 3-5 second hold  ASSESSMENT:  CLINICAL IMPRESSION: Patient is a 46 y.o. female who presents to physical therapy with  complaint of LT shoulder pain. Patient demonstrates decreased strength, ROM restriction, reduced flexibility, increased tenderness to palpation and postural abnormalities which are likely contributing to symptoms of pain and are negatively impacting patient ability to perform ADLs. Patient will benefit from skilled physical therapy services to address these deficits to reduce pain and improve level of function with ADLs   OBJECTIVE IMPAIRMENTS decreased activity  tolerance, decreased ROM, decreased strength, increased fascial restrictions, impaired flexibility, impaired UE functional use, improper body mechanics, postural dysfunction, and pain.   ACTIVITY LIMITATIONS carrying, lifting, reach over head, and caring for others  PARTICIPATION LIMITATIONS: meal prep, cleaning, laundry, driving, shopping, community activity, occupation, and yard work  PERSONAL FACTORS  None  are also affecting patient's functional outcome.   REHAB POTENTIAL: Good  CLINICAL DECISION MAKING: Stable/uncomplicated  EVALUATION COMPLEXITY: Low   GOALS: SHORT TERM GOALS: Target date: 02/06/2022  Patient will be independent with initial HEP and self-management strategies to improve functional outcomes Baseline:  Goal status: INITIAL   LONG TERM GOALS: Target date: 02/27/2022  Patient will be independent with advanced HEP and self-management strategies to improve functional outcomes Baseline:  Goal status: INITIAL  2.  Patient will improve FOTO score to predicted value to indicate improvement in functional outcomes Baseline: 62% function Goal status: INITIAL  3.  Patient will report at least 80% overall improvement in subjective complaint to indicate improvement in ability to perform ADLs. Baseline:  Goal status: INITIAL  4. Patient will report a decrease in LT shoulder pain to 0/10 at rest, and no more than 3/10 with activity for improved quality of life and ability to perform UE ADLs  Baseline: 4/10 Goal  status: INITIAL  PLAN: PT FREQUENCY: 1-2x/week  PT DURATION: 6 weeks  PLANNED INTERVENTIONS: Therapeutic exercises, Therapeutic activity, Neuromuscular re-education, Balance training, Gait training, Patient/Family education, Joint manipulation, Joint mobilization, Stair training, Aquatic Therapy, Dry Needling, Electrical stimulation, Spinal manipulation, Spinal mobilization, Cryotherapy, Moist heat, scar mobilization, Taping, Traction, Ultrasound, Biofeedback, Ionotophoresis '4mg'$ /ml Dexamethasone, and Manual therapy.   PLAN FOR NEXT SESSION: F/U on DN. Progress postural and scapular strengthening as tolerated. Manual STM as needed.   4:59 PM, 01/16/22 Josue Hector PT DPT  Physical Therapist with [facility]  [phone]

## 2022-01-23 ENCOUNTER — Ambulatory Visit: Payer: 59 | Admitting: Family Medicine

## 2022-01-23 ENCOUNTER — Ambulatory Visit (HOSPITAL_COMMUNITY): Payer: 59 | Attending: Orthopedic Surgery | Admitting: Physical Therapy

## 2022-01-23 ENCOUNTER — Encounter: Payer: Self-pay | Admitting: Family Medicine

## 2022-01-23 ENCOUNTER — Other Ambulatory Visit (HOSPITAL_COMMUNITY): Payer: Self-pay

## 2022-01-23 ENCOUNTER — Encounter (HOSPITAL_COMMUNITY): Payer: Self-pay | Admitting: Physical Therapy

## 2022-01-23 VITALS — BP 112/72 | HR 67 | Resp 16 | Ht 62.5 in | Wt 302.0 lb

## 2022-01-23 DIAGNOSIS — E785 Hyperlipidemia, unspecified: Secondary | ICD-10-CM

## 2022-01-23 DIAGNOSIS — J309 Allergic rhinitis, unspecified: Secondary | ICD-10-CM

## 2022-01-23 DIAGNOSIS — Z1329 Encounter for screening for other suspected endocrine disorder: Secondary | ICD-10-CM

## 2022-01-23 DIAGNOSIS — E669 Obesity, unspecified: Secondary | ICD-10-CM

## 2022-01-23 DIAGNOSIS — E1169 Type 2 diabetes mellitus with other specified complication: Secondary | ICD-10-CM | POA: Diagnosis not present

## 2022-01-23 DIAGNOSIS — M25512 Pain in left shoulder: Secondary | ICD-10-CM | POA: Diagnosis not present

## 2022-01-23 DIAGNOSIS — K219 Gastro-esophageal reflux disease without esophagitis: Secondary | ICD-10-CM

## 2022-01-23 DIAGNOSIS — E559 Vitamin D deficiency, unspecified: Secondary | ICD-10-CM

## 2022-01-23 MED ORDER — TIRZEPATIDE 7.5 MG/0.5ML ~~LOC~~ SOAJ
7.5000 mg | SUBCUTANEOUS | 1 refills | Status: DC
  Filled 2022-01-23: qty 6, 84d supply, fill #0
  Filled 2022-03-14: qty 2, 28d supply, fill #0
  Filled 2022-03-14 (×5): qty 6, 84d supply, fill #0
  Filled 2022-04-07: qty 2, 28d supply, fill #1

## 2022-01-23 MED ORDER — METFORMIN HCL 1000 MG PO TABS
1000.0000 mg | ORAL_TABLET | Freq: Every day | ORAL | 0 refills | Status: DC
  Filled 2022-01-23: qty 90, 90d supply, fill #0

## 2022-01-23 NOTE — Patient Instructions (Addendum)
F/u in 13 weeks, call if you need me sooner  Labs today cBC, lipid , cmp and eGFr , hBA1C, TSH and vit D today  Please schedule and get pap, mammogram and flu and covid vaccines  It is important that you exercise regularly at least 30 minutes 5 times a week. If you develop chest pain, have severe difficulty breathing, or feel very tired, stop exercising immediately and seek medical attention    Food choice is crucial, and stop eating after 12 hours    Weight loss goal of 8 to 10 pounds  Higher dose mounjaro when next filling is 7.5 , hoping it will curb appetite/increase satiety even more  Lower dose metformin to one or none daily depending on fBG  Goal for fasting blood sugar ranges from 80 to 120 and 2 hours after any meal or at bedtime should be between 130 to 170.  Thanks for choosing [facility], we consider it a privelige to serve you.

## 2022-01-23 NOTE — Therapy (Signed)
OUTPATIENT PHYSICAL THERAPY SHOULDER TREATMENT   Patient Name: [name] MRN: [id number] DOB:1975-07-08, 46 y.o., female Today's Date: 01/23/2022   PT End of Session - 01/23/22 1122     Visit Number 2    Number of Visits 12    Date for PT Re-Evaluation 02/27/22    Authorization Type [employer]    Progress Note Due on Visit 10    PT Start Time 1119    PT Stop Time 1157    PT Time Calculation (min) 38 min    Activity Tolerance Patient tolerated treatment well    Behavior During Therapy [facility] for tasks assessed/performed             Past Medical History:  Diagnosis Date   Allergy    Anemia    Arthritis    Diabetes mellitus    type II    Educated about COVID-19 virus infection 09/07/2018   Family history of colonic polyps    GERD (gastroesophageal reflux disease)    Iron deficiency anemia 09/03/2007   Qualifier: Diagnosis of  By: Rennie Plowman     PONV (postoperative nausea and vomiting)    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   Past Surgical History:  Procedure Laterality Date   CARDIAC CATHETERIZATION     CESAREAN SECTION     x 2   COLONOSCOPY WITH PROPOFOL N/A 04/20/2019   Procedure: COLONOSCOPY WITH PROPOFOL;  Surgeon: Thornton Park, MD;  [facility];  Service: Gastroenterology;  Laterality: N/A;   LAPAROSCOPIC GASTRIC SLEEVE RESECTION N/A 04/02/2016   Procedure: LAPAROSCOPIC GASTRIC SLEEVE RESECTION, UPPER ENDO;  Surgeon: Johnathan Hausen, MD;  Location: [facility];  Service: General;  Laterality: N/A;   POLYPECTOMY  04/20/2019   Procedure: POLYPECTOMY;  Surgeon: Thornton Park, MD;  [facility];  Service: Gastroenterology;;   Patient Active Problem List   Diagnosis Date Noted   GAD (generalized anxiety disorder) 09/19/2021   Sciatica, left side 08/30/2020   Knee pain, right 05/21/2019   Adenomatous polyp of ascending colon    Dyslipidemia 09/07/2018   Left otitis media 10/21/2017   Morbid obesity ([place])  09/12/2015   Vitamin B 12 deficiency 11/22/2011   Vitamin D deficiency 11/22/2011   Diabetes mellitus type 2 in obese 09/03/2007   Hyperlipidemia LDL goal <100 09/03/2007   Allergic rhinitis 09/03/2007   GERD 09/03/2007    PCP: Tula Nakayama MD  REFERRING PROVIDER: Carole Civil, MD   REFERRING DIAG: [phone] (ICD-10-CM) - Chronic left shoulder pain M75.42 (ICD-10-CM) - Impingement syndrome of left shoulder M25.512 (ICD-10-CM) - Trigger point of left shoulder region   THERAPY DIAG:  Left shoulder pain, unspecified chronicity  Rationale for Evaluation and Treatment Rehabilitation  ONSET DATE: About a month   SUBJECTIVE:  SUBJECTIVE STATEMENT: Doing well today. Pain hasn't been too bad. Has been compliant with HEP without issues.   PERTINENT HISTORY: NA  PAIN:  Are you having pain? Yes: NPRS scale: 4/10 Pain location: Lt shoulder  Pain description: tingling, burning, aching Aggravating factors: Reaching back, reaching outward, reaching across body  Relieving factors: Rest, meds   PRECAUTIONS: None  WEIGHT BEARING RESTRICTIONS No  FALLS:  Has patient fallen in last 6 months? 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 Mild tendinosis of the supraspinatus tendon with irregularity along the anterior bursal surface. 2. Mild tendinosis of the infraspinatus tendon. 3. Mild tendinosis of the subscapularis tendon.    PATIENT SURVEYS:  FOTO 62% function   COGNITION:  Overall cognitive status:  Within functional limits for tasks assessed     SENSATION: WFL  POSTURE: Rounded shoulder   UPPER EXTREMITY ROM:   Bilateral shoulder AROM WFL but increased pain with LT shoulder IR, ER and abduction   UPPER EXTREMITY MMT:  MMT Right eval Left eval  Shoulder flexion 5 5  Shoulder extension    Shoulder abduction 5 4*  Shoulder adduction    Shoulder internal rotation 5 4*  Shoulder external rotation 5 4+  Middle trapezius    Lower trapezius    Elbow flexion    Elbow extension    Wrist flexion    Wrist extension    Wrist ulnar deviation    Wrist radial deviation    Wrist pronation    Wrist supination    Grip strength (lbs)    (Blank rows = not tested)  SHOULDER SPECIAL TESTS:  Impingement tests: Neer impingement test: positive , Hawkins/Kennedy impingement test: positive , and Painful arc test: positive   PALPATION:  Mod TTP about LT upper trap, posterolateral deltoid    TODAY'S TREATMENT:  01/23/22 Goals Review  Manual STM to LT upper trap and levator pre and post dry needling for trigger point identification and surface area preparation   Trigger Point Dry-Needling  Treatment instructions: Expect mild to moderate muscle soreness. S/S of pneumothorax if dry needled over a lung field, and to seek immediate medical attention should they occur. Patient verbalized understanding of these instructions and education.  Patient Consent Given: Yes Education handout provided: Previously provided Muscles treated: Lt upper trap and levator Electrical stimulation performed: No Parameters: N/A Treatment response/outcome: Good tolerance  UT stretch 2 x 30"  Scap retraction 10 x 5"  Seated shoulder ER GTB 2 x 10 Band row GTB 2 x10  Shoulder extension GTB 2 x 10  Eval UT stretch Scap retraction Seated shoulder ER RTB    PATIENT EDUCATION: Education details: on eval findings, POC and HEP  Person educated: Patient Education method: Explanation Education  comprehension: verbalized understanding   HOME EXERCISE PROGRAM: Access Code: [email or web address] URL: [email or web address]  01/23/22 - Standing Shoulder Row with Anchored Resistance  - 1-2 x daily - 7 x weekly - 2 sets - 10 reps - 3-5 second hold - Shoulder extension with resistance - Neutral  - 1-2 x daily - 7 x weekly - 2 sets - 10 reps - 3-5 second hold  Date: 01/16/2022 Prepared by: Josue Hector  Exercises - Seated Scapular Retraction  - 2-3  x daily - 7 x weekly - 2 sets - 10 reps - 3-5 second hold - Seated Upper Trapezius Stretch  - 2-3 x daily - 7 x weekly - 1 sets - 4 reps - 20 second  hold - Shoulder External Rotation and Scapular Retraction with Resistance  - 2-3 x daily - 7 x weekly - 2 sets - 10 reps - 3-5 second hold  ASSESSMENT:  CLINICAL IMPRESSION: Patient tolerated session well today. Performed trigger point dry needling with good result. Patient noting decreased muscle tension following. Progresses scapular strengthening with added band rows and shoulder extensions. Patient educated on purpose and function of all added exercise and provided cues for proper form. Updated HEP and issued handout. Patient will continue to benefit from skilled therapy services to reduce remaining deficits and improve functional ability.    OBJECTIVE IMPAIRMENTS decreased activity tolerance, decreased ROM, decreased strength, increased fascial restrictions, impaired flexibility, impaired UE functional use, improper body mechanics, postural dysfunction, and pain.   ACTIVITY LIMITATIONS carrying, lifting, reach over head, and caring for others  PARTICIPATION LIMITATIONS: meal prep, cleaning, laundry, driving, shopping, community activity, occupation, and yard work  PERSONAL FACTORS  None  are also affecting patient's functional outcome.   REHAB POTENTIAL: Good  CLINICAL DECISION MAKING: Stable/uncomplicated  EVALUATION COMPLEXITY: Low   GOALS: SHORT TERM GOALS: Target date:  02/06/2022  Patient will be independent with initial HEP and self-management strategies to improve functional outcomes Baseline:  Goal status: INITIAL   LONG TERM GOALS: Target date: 02/27/2022  Patient will be independent with advanced HEP and self-management strategies to improve functional outcomes Baseline:  Goal status: INITIAL  2.  Patient will improve FOTO score to predicted value to indicate improvement in functional outcomes Baseline: 62% function Goal status: INITIAL  3.  Patient will report at least 80% overall improvement in subjective complaint to indicate improvement in ability to perform ADLs. Baseline:  Goal status: INITIAL  4. Patient will report a decrease in LT shoulder pain to 0/10 at rest, and no more than 3/10 with activity for improved quality of life and ability to perform UE ADLs  Baseline: 4/10 Goal status: INITIAL  PLAN: PT FREQUENCY: 1-2x/week  PT DURATION: 6 weeks  PLANNED INTERVENTIONS: Therapeutic exercises, Therapeutic activity, Neuromuscular re-education, Balance training, Gait training, Patient/Family education, Joint manipulation, Joint mobilization, Stair training, Aquatic Therapy, Dry Needling, Electrical stimulation, Spinal manipulation, Spinal mobilization, Cryotherapy, Moist heat, scar mobilization, Taping, Traction, Ultrasound, Biofeedback, Ionotophoresis '4mg'$ /ml Dexamethasone, and Manual therapy.   PLAN FOR NEXT SESSION: F/U on DN. Progress postural and scapular strengthening as tolerated. Manual STM as needed.   11:22 AM, 01/23/22 Josue Hector PT DPT  Physical Therapist with [facility]  [phone]

## 2022-01-24 ENCOUNTER — Other Ambulatory Visit (HOSPITAL_COMMUNITY): Payer: Self-pay

## 2022-01-24 ENCOUNTER — Encounter: Payer: Self-pay | Admitting: Family Medicine

## 2022-01-24 LAB — CBC WITH DIFFERENTIAL/PLATELET
Basophils Absolute: 0.1 10*3/uL (ref 0.0–0.2)
Basos: 1 %
EOS (ABSOLUTE): 0.2 10*3/uL (ref 0.0–0.4)
Eos: 3 %
Hematocrit: 36.9 % (ref 34.0–46.6)
Hemoglobin: 11.1 g/dL (ref 11.1–15.9)
Immature Grans (Abs): 0 10*3/uL (ref 0.0–0.1)
Immature Granulocytes: 0 %
Lymphocytes Absolute: 2.1 10*3/uL (ref 0.7–3.1)
Lymphs: 40 %
MCH: 26.3 pg — ABNORMAL LOW (ref 26.6–33.0)
MCHC: 30.1 g/dL — ABNORMAL LOW (ref 31.5–35.7)
MCV: 87 fL (ref 79–97)
Monocytes Absolute: 0.3 10*3/uL (ref 0.1–0.9)
Monocytes: 6 %
Neutrophils Absolute: 2.6 10*3/uL (ref 1.4–7.0)
Neutrophils: 50 %
Platelets: 309 10*3/uL (ref 150–450)
RBC: 4.22 x10E6/uL (ref 3.77–5.28)
RDW: 13.5 % (ref 11.7–15.4)
WBC: 5.3 10*3/uL (ref 3.4–10.8)

## 2022-01-24 LAB — CMP14+EGFR
ALT: 8 IU/L (ref 0–32)
AST: 12 IU/L (ref 0–40)
Albumin/Globulin Ratio: 1.7 (ref 1.2–2.2)
Albumin: 4.4 g/dL (ref 3.9–4.9)
Alkaline Phosphatase: 67 IU/L (ref 44–121)
BUN/Creatinine Ratio: 20 (ref 9–23)
BUN: 11 mg/dL (ref 6–24)
Bilirubin Total: 0.3 mg/dL (ref 0.0–1.2)
CO2: 21 mmol/L (ref 20–29)
Calcium: 9 mg/dL (ref 8.7–10.2)
Chloride: 105 mmol/L (ref 96–106)
Creatinine, Ser: 0.54 mg/dL — ABNORMAL LOW (ref 0.57–1.00)
Globulin, Total: 2.6 g/dL (ref 1.5–4.5)
Glucose: 97 mg/dL (ref 70–99)
Potassium: 4.3 mmol/L (ref 3.5–5.2)
Sodium: 140 mmol/L (ref 134–144)
Total Protein: 7 g/dL (ref 6.0–8.5)
eGFR: 113 mL/min/{1.73_m2} (ref >59)

## 2022-01-24 LAB — LIPID PANEL
Chol/HDL Ratio: 2.8 ratio (ref 0.0–4.4)
Cholesterol, Total: 174 mg/dL (ref 100–199)
HDL: 62 mg/dL (ref >39)
LDL Chol Calc (NIH): 101 mg/dL — ABNORMAL HIGH (ref 0–99)
Triglycerides: 54 mg/dL (ref 0–149)
VLDL Cholesterol Cal: 11 mg/dL (ref 5–40)

## 2022-01-24 LAB — VITAMIN D 25 HYDROXY (VIT D DEFICIENCY, FRACTURES): Vit D, 25-Hydroxy: 11.8 ng/mL — ABNORMAL LOW (ref 30.0–100.0)

## 2022-01-24 LAB — HEMOGLOBIN A1C
Est. average glucose Bld gHb Est-mCnc: 131 mg/dL
Hgb A1c MFr Bld: 6.2 % — ABNORMAL HIGH (ref 4.8–5.6)

## 2022-01-24 LAB — TSH: TSH: 3.08 u[IU]/mL (ref 0.450–4.500)

## 2022-01-24 MED ORDER — ERGOCALCIFEROL 1.25 MG (50000 UT) PO CAPS
50000.0000 [IU] | ORAL_CAPSULE | ORAL | 2 refills | Status: DC
  Filled 2022-01-24: qty 12, 84d supply, fill #0
  Filled 2022-03-14 – 2022-05-03 (×2): qty 12, 84d supply, fill #1
  Filled 2022-08-03: qty 12, 84d supply, fill #2
  Filled 2022-08-06: qty 12, 84d supply, fill #0

## 2022-01-25 ENCOUNTER — Ambulatory Visit (HOSPITAL_COMMUNITY): Payer: 59

## 2022-01-25 ENCOUNTER — Encounter: Payer: Self-pay | Admitting: Orthopedic Surgery

## 2022-01-25 ENCOUNTER — Ambulatory Visit: Payer: 59 | Admitting: Orthopedic Surgery

## 2022-01-25 VITALS — Ht 62.5 in | Wt 302.0 lb

## 2022-01-25 DIAGNOSIS — M7542 Impingement syndrome of left shoulder: Secondary | ICD-10-CM

## 2022-01-25 DIAGNOSIS — M25512 Pain in left shoulder: Secondary | ICD-10-CM

## 2022-01-25 MED ORDER — METHYLPREDNISOLONE ACETATE 40 MG/ML IJ SUSP
40.0000 mg | Freq: Once | INTRAMUSCULAR | Status: AC
  Administered 2022-01-25: 40 mg via INTRA_ARTICULAR

## 2022-01-25 NOTE — Progress Notes (Signed)
Follow up  Chief Complaint  Patient presents with   Shoulder Pain    Lt shoulder MRI     Encounter Diagnoses  Name Primary?   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  She does not have a tear she has some rotator cuff tendinopathy  Shoulder her images and went over them with her  Since there is no tear we do not need to do any surgery  We need to continue with home exercise program, Motrin, I repeated her injection  She says the dry needling helped her trigger point so that has resolved  Follow-up as needed

## 2022-01-25 NOTE — Patient Instructions (Signed)
Joint Steroid Injection A joint steroid injection is a procedure to relieve swelling and pain in a joint. Steroids are medicines that reduce inflammation. In this procedure, your health care provider uses a syringe and a needle to inject a steroid medicine into a painful and inflamed joint. A pain-relieving medicine (anesthetic) may be injected along with the steroid. In some cases, your health care provider may use an imaging technique such as ultrasound or fluoroscopy to guide the injection. Joints that are often treated with steroid injections include the knee, shoulder, hip, and spine. These injections may also be used in the elbow, ankle, and joints of the hands or feet. You may have joint steroid injections as part of your treatment for inflammation caused by: Gout. Rheumatoid arthritis. Advanced wear-and-tear arthritis (osteoarthritis). Tendinitis. Bursitis. Joint steroid injections may be repeated, but having them too often can damage a joint or the skin over the joint. You should not have joint steroid injections less than 6 weeks apart or more than four times a year. Tell a health care provider about: Any allergies you have. All medicines you are taking, including vitamins, herbs, eye drops, creams, and over-the-counter medicines. Any problems you or family members have had with anesthetic medicines. Any blood disorders you have. Any surgeries you have had. Any medical conditions you have. Whether you are pregnant or may be pregnant. What are the risks? Generally, this is a safe treatment. However, problems may occur, including: Infection. Bleeding. Allergic reactions to medicines. Damage to the joint or tissues around the joint. Thinning of skin or loss of skin color over the joint. Temporary flushing of the face or chest. Temporary increase in pain. Temporary increase in blood sugar. Failure to relieve inflammation or pain. What happens before the treatment? Medicines Ask  your health care provider about: Changing or stopping your regular medicines. This is especially important if you are taking diabetes medicines or blood thinners. Taking medicines such as aspirin and ibuprofen. These medicines can thin your blood. Do not take these medicines unless your health care provider tells you to take them. Taking over-the-counter medicines, vitamins, herbs, and supplements. General instructions You may have imaging tests of your joint. Ask your health care provider if you can drive yourself home after the procedure. What happens during the treatment?  Your health care provider will position you for the injection and locate the injection site over your joint. The skin over the joint will be cleaned with a germ-killing soap. Your health care provider may: Spray a numbing solution (topical anesthetic) over the injection site. Inject a local anesthetic under the skin above your joint. The needle will be placed through your skin into your joint. Your health care provider may use imaging to guide the needle to the right spot for the injection. If imaging is used, a special contrast dye may be injected to confirm that the needle is in the correct location. The steroid medicine will be injected into your joint. Anesthetic may be injected along with the steroid. This may be a medicine that relieves pain for a short time (short-acting anesthetic) or for a longer time (long-acting anesthetic). The needle will be removed, and an adhesive bandage (dressing) will be placed over the injection site. The procedure may vary among health care providers and hospitals. What can I expect after the treatment? You will be able to go home after the treatment. It is normal to feel slight flushing for a few days after the injection. After the treatment, it is   common to have an increase in joint pain after the anesthetic has worn off. This may happen about an hour after a short-acting anesthetic  or about 8 hours after a longer-acting anesthetic. You should begin to feel relief from joint pain and swelling after 24 to 48 hours. Contact your health care provider if you do not begin to feel relief after 2 days. Follow these instructions at home: Injection site care Leave the adhesive dressing over your injection site in place until your health care provider says you can remove it. Check your injection site every day for signs of infection. Check for: More redness, swelling, or pain. Fluid or blood. Warmth. Pus or a bad smell. Activity Return to your normal activities as told by your health care provider. Ask your health care provider what activities are safe for you. You may be asked to limit activities that put stress on the joint for a few days. Do joint exercises as told by your health care provider. Do not take baths, swim, or use a hot tub until your health care provider approves. Ask your health care provider if you may take showers. You may only be allowed to take sponge baths. Managing pain, stiffness, and swelling  If directed, put ice on the joint. To do this: Put ice in a plastic bag. Place a towel between your skin and the bag. Leave the ice on for 20 minutes, 2-3 times a day. Remove the ice if your skin turns bright red. This is very important. If you cannot feel pain, heat, or cold, you have a greater risk of damage to the area. Raise (elevate) your joint above the level of your heart when you are sitting or lying down. General instructions Take over-the-counter and prescription medicines only as told by your health care provider. Do not use any products that contain nicotine or tobacco, such as cigarettes, e-cigarettes, and chewing tobacco. These can delay joint healing. If you need help quitting, ask your health care provider. If you have diabetes, be aware that your blood sugar may be slightly elevated for several days after the injection. Keep all follow-up visits.  This is important. Contact a health care provider if you have: Chills or a fever. Any signs of infection at your injection site. Increased pain or swelling or no relief after 2 days. Summary A joint steroid injection is a treatment to relieve pain and swelling in a joint. Steroids are medicines that reduce inflammation. Your health care provider may add an anesthetic along with the steroid. You may have joint steroid injections as part of your arthritis treatment. Joint steroid injections may be repeated, but having them too often can damage a joint or the skin over the joint. Contact your health care provider if you have a fever, chills, or signs of infection, or if you get no relief from joint pain or swelling. This information is not intended to replace advice given to you by your health care provider. Make sure you discuss any questions you have with your health care provider. Document Revised: 09/18/2019 Document Reviewed: 09/18/2019 Elsevier Patient Education  2023 [employer] Inc[employer]  

## 2022-01-26 ENCOUNTER — Encounter: Payer: Self-pay | Admitting: Family Medicine

## 2022-01-26 NOTE — Assessment & Plan Note (Signed)
  Patient re-educated about  the importance of commitment to a  minimum of 150 minutes of exercise per week as able.  The importance of healthy food choices with portion control discussed, as well as eating regularly and within a 12 hour window most days. The need to choose "clean , green" food 50 to 75% of the time is discussed, as well as to make water the primary drink and set a goal of 64 ounces water daily.       01/25/2022    3:01 PM 01/23/2022   10:17 AM 12/07/2021    3:18 PM  Weight /BMI  Weight 302 lb 302 lb 306 lb  Height 5' 2.5" (1.588 m) 5' 2.5" (1.588 m) 5' 2.5" (1.588 m)  BMI 54.36 kg/m2 54.36 kg/m2 55.08 kg/m2

## 2022-01-26 NOTE — Assessment & Plan Note (Signed)
Controlled, no change in medication  

## 2022-01-26 NOTE — Assessment & Plan Note (Signed)
[name] is reminded of the importance of commitment to daily physical activity for 30 minutes or more, as able and the need to limit carbohydrate intake to 30 to 60 grams per meal to help with blood sugar control.   The need to take medication as prescribed, test blood sugar as directed, and to call between visits if there is a concern that blood sugar is uncontrolled is also discussed.   [name] is reminded of the importance of daily foot exam, annual eye examination, and good blood sugar, blood pressure and cholesterol control. Increase mounjaro dose and reduce metformin as needed     Latest Ref Rng & Units 01/23/2022   10:52 AM 09/15/2021    1:13 PM 05/15/2021   11:56 AM 01/25/2021    4:14 PM 10/12/2020    9:52 AM  Diabetic Labs  HbA1c 4.8 - 5.6 % 6.2  6.0  6.0  6.8    Micro/Creat Ratio 0 - 29 mg/g creat  5      Chol 100 - 199 mg/dL 174  145   178    HDL >39 mg/dL 62  59   63    Calc LDL 0 - 99 mg/dL 101  75   104    Triglycerides 0 - 149 mg/dL 54  51   55    Creatinine 0.57 - 1.00 mg/dL 0.54  0.62  0.60  0.63  0.61       01/25/2022    3:01 PM 01/23/2022   10:17 AM 12/07/2021    3:18 PM 09/19/2021   11:05 AM 05/19/2021   11:33 AM 03/17/2021    4:08 PM 01/27/2021    1:16 PM  BP/Weight  Systolic BP  889 169 450 388 828 003  Diastolic BP  72 84 76 73 80 75  Wt. (Lbs) 302 302 306 302.08 303.04 317.4 322.8  BMI 54.36 kg/m2 54.36 kg/m2 55.08 kg/m2 55.25 kg/m2 55.43 kg/m2 56.22 kg/m2 57.18 kg/m2      Latest Ref Rng & Units 01/23/2022   10:00 AM 02/09/2021   12:00 AM  Foot/eye exam completion dates  Eye Exam No Retinopathy  No Retinopathy      Foot Form Completion  Done      This result is from an external source.

## 2022-01-26 NOTE — Assessment & Plan Note (Signed)
Hyperlipidemia:Low fat diet discussed and encouraged.   Lipid Panel  Lab Results  Component Value Date   CHOL 174 01/23/2022   HDL 62 01/23/2022   LDLCALC 101 (H) 01/23/2022   TRIG 54 01/23/2022   CHOLHDL 2.8 01/23/2022   Needs to reduce fat in diet

## 2022-01-26 NOTE — Progress Notes (Signed)
[name]     MRN: [id number]      DOB: 04/29/1975   HPI [name] is here for follow up and re-evaluation of chronic medical conditions, medication management and review of any available recent lab and radiology data.  Preventive health is updated, specifically  Cancer screening and Immunization.   Questions or concerns regarding consultations or procedures which the PT has had in the interim are  addressed. The PT denies any adverse reactions to current medications since the last visit.  There are no new concerns.  There are no specific complaints   ROS Denies recent fever or chills. Denies sinus pressure, nasal congestion, ear pain or sore throat. Denies chest congestion, productive cough or wheezing. Denies chest pains, palpitations and leg swelling Denies abdominal pain, nausea, vomiting,diarrhea or constipation.   Denies dysuria, frequency, hesitancy or incontinence. Denies joint pain, swelling and limitation in mobility. Denies headaches, seizures, numbness, or tingling. Denies depression, anxiety or insomnia. Denies skin break down or rash.   PE  BP 112/72   Pulse 67   Resp 16   Ht 5' 2.5" (1.588 m)   Wt (!) 302 lb (137 kg)   SpO2 97%   BMI 54.36 kg/m   Patient alert and oriented and in no cardiopulmonary distress.  HEENT: No facial asymmetry, EOMI,     Neck supple .  Chest: Clear to auscultation bilaterally.  CVS: S1, S2 no murmurs, no S3.Regular rate.  ABD: Soft non tender.   Ext: No edema  MS: Adequate ROM spine, shoulders, hips and knees.  Skin: Intact, no ulcerations or rash noted.  Psych: Good eye contact, normal affect. Memory intact not anxious or depressed appearing.  CNS: CN 2-12 intact, power,  normal throughout.no focal deficits noted.   Assessment & Plan  Diabetes mellitus type 2 in obese [name] is reminded of the importance of commitment to daily physical activity for 30 minutes or more, as able and the need to limit  carbohydrate intake to 30 to 60 grams per meal to help with blood sugar control.   The need to take medication as prescribed, test blood sugar as directed, and to call between visits if there is a concern that blood sugar is uncontrolled is also discussed.   [name] is reminded of the importance of daily foot exam, annual eye examination, and good blood sugar, blood pressure and cholesterol control. Increase mounjaro dose and reduce metformin as needed     Latest Ref Rng & Units 01/23/2022   10:52 AM 09/15/2021    1:13 PM 05/15/2021   11:56 AM 01/25/2021    4:14 PM 10/12/2020    9:52 AM  Diabetic Labs  HbA1c 4.8 - 5.6 % 6.2  6.0  6.0  6.8    Micro/Creat Ratio 0 - 29 mg/g creat  5      Chol 100 - 199 mg/dL 174  145   178    HDL >39 mg/dL 62  59   63    Calc LDL 0 - 99 mg/dL 101  75   104    Triglycerides 0 - 149 mg/dL 54  51   55    Creatinine 0.57 - 1.00 mg/dL 0.54  0.62  0.60  0.63  0.61       01/25/2022    3:01 PM 01/23/2022   10:17 AM 12/07/2021    3:18 PM 09/19/2021   11:05 AM 05/19/2021   11:33 AM 03/17/2021    4:08 PM 01/27/2021  1:16 PM  BP/Weight  Systolic BP  295 188 416 606 301 601  Diastolic BP  72 84 76 73 80 75  Wt. (Lbs) 302 302 306 302.08 303.04 317.4 322.8  BMI 54.36 kg/m2 54.36 kg/m2 55.08 kg/m2 55.25 kg/m2 55.43 kg/m2 56.22 kg/m2 57.18 kg/m2      Latest Ref Rng & Units 01/23/2022   10:00 AM 02/09/2021   12:00 AM  Foot/eye exam completion dates  Eye Exam No Retinopathy  No Retinopathy      Foot Form Completion  Done      This result is from an external source.        Allergic rhinitis Controlled, no change in medication   Morbid obesity ([place])  Patient re-educated about  the importance of commitment to a  minimum of 150 minutes of exercise per week as able.  The importance of healthy food choices with portion control discussed, as well as eating regularly and within a 12 hour window most days. The need to choose "clean , green" food 50 to 75% of  the time is discussed, as well as to make water the primary drink and set a goal of 64 ounces water daily.       01/25/2022    3:01 PM 01/23/2022   10:17 AM 12/07/2021    3:18 PM  Weight /BMI  Weight 302 lb 302 lb 306 lb  Height 5' 2.5" (1.588 m) 5' 2.5" (1.588 m) 5' 2.5" (1.588 m)  BMI 54.36 kg/m2 54.36 kg/m2 55.08 kg/m2      Dyslipidemia Hyperlipidemia:Low fat diet discussed and encouraged.   Lipid Panel  Lab Results  Component Value Date   CHOL 174 01/23/2022   HDL 62 01/23/2022   LDLCALC 101 (H) 01/23/2022   TRIG 54 01/23/2022   CHOLHDL 2.8 01/23/2022   Needs to reduce fat in diet    GERD Controlled, no change in medication

## 2022-02-01 ENCOUNTER — Encounter (HOSPITAL_COMMUNITY): Payer: Self-pay | Admitting: Physical Therapy

## 2022-02-01 ENCOUNTER — Ambulatory Visit (HOSPITAL_COMMUNITY): Payer: 59 | Admitting: Physical Therapy

## 2022-02-01 DIAGNOSIS — M25512 Pain in left shoulder: Secondary | ICD-10-CM | POA: Diagnosis not present

## 2022-02-01 NOTE — Therapy (Signed)
OUTPATIENT PHYSICAL THERAPY SHOULDER TREATMENT   Patient Name: [name] MRN: [id number] DOB:04/30/1975, 46 y.o., female Today's Date: 02/01/2022   PT End of Session - 02/01/22 0743     Visit Number 3    Number of Visits 12    Date for PT Re-Evaluation 02/27/22    Authorization Type [employer] [employer]    Progress Note Due on Visit 10    PT Start Time [phone]   arrive late   PT Stop Time 0814    PT Time Calculation (min) 31 min    Activity Tolerance Patient tolerated treatment well    Behavior During Therapy [facility] for tasks assessed/performed             Past Medical History:  Diagnosis Date   Allergy    Anemia    Arthritis    Diabetes mellitus    type II    Educated about COVID-19 virus infection 09/07/2018   Family history of colonic polyps    GERD (gastroesophageal reflux disease)    Iron deficiency anemia 09/03/2007   Qualifier: Diagnosis of  By: Rennie Plowman     PONV (postoperative nausea and vomiting)    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   Past Surgical History:  Procedure Laterality Date   CARDIAC CATHETERIZATION     CESAREAN SECTION     x 2   COLONOSCOPY WITH PROPOFOL N/A 04/20/2019   Procedure: COLONOSCOPY WITH PROPOFOL;  Surgeon: Thornton Park, MD;  Location: [facility];  Service: Gastroenterology;  Laterality: N/A;   LAPAROSCOPIC GASTRIC SLEEVE RESECTION N/A 04/02/2016   Procedure: LAPAROSCOPIC GASTRIC SLEEVE RESECTION, UPPER ENDO;  Surgeon: Johnathan Hausen, MD;  Location: [facility];  Service: General;  Laterality: N/A;   POLYPECTOMY  04/20/2019   Procedure: POLYPECTOMY;  Surgeon: Thornton Park, MD;  Location: [facility];  Service: Gastroenterology;;   Patient Active Problem List   Diagnosis Date Noted   GAD (generalized anxiety disorder) 09/19/2021   Sciatica, left side 08/30/2020   Knee pain, right 05/21/2019   Adenomatous polyp of ascending colon    Dyslipidemia 09/07/2018   Morbid obesity ([place]) 09/12/2015    Vitamin B 12 deficiency 11/22/2011   Vitamin D deficiency 11/22/2011   Diabetes mellitus type 2 in obese 09/03/2007   Hyperlipidemia LDL goal <100 09/03/2007   Allergic rhinitis 09/03/2007   GERD 09/03/2007    PCP: Tula Nakayama MD  REFERRING PROVIDER: Carole Civil, MD   REFERRING DIAG: [phone] (ICD-10-CM) - Chronic left shoulder pain M75.42 (ICD-10-CM) - Impingement syndrome of left shoulder M25.512 (ICD-10-CM) - Trigger point of left shoulder region   THERAPY DIAG:  Left shoulder pain, unspecified chronicity  Rationale for Evaluation and Treatment Rehabilitation  ONSET DATE: About a month   SUBJECTIVE:  SUBJECTIVE STATEMENT: Had injections last week and it seemed to help some. Still a little achy. Feels like DN helped. Exercises have been going alright.   PERTINENT HISTORY: NA  PAIN:  Are you having pain? Yes: NPRS scale: 0 at rest/10 Pain location: Lt shoulder  Pain description: tingling, burning, aching Aggravating factors: Reaching back, reaching outward, reaching across body  Relieving factors: Rest, meds   PRECAUTIONS: None  WEIGHT BEARING RESTRICTIONS No  FALLS:  Has patient fallen in last 6 months? No  LIVING ENVIRONMENT: Lives with: lives with their family and lives with their spouse Lives in: House/apartment Stairs: Yes: External: 2 steps; none Has following equipment at home: None  OCCUPATION: Therapist, sports at [employer]: [employer] Not be hurting   OBJECTIVE:   DIAGNOSTIC FINDINGS:  IMPRESSION: 1. Mild tendinosis of the supraspinatus tendon with irregularity along the anterior bursal surface. 2. Mild tendinosis of the infraspinatus tendon. 3. Mild tendinosis of the subscapularis tendon.    PATIENT SURVEYS:  FOTO 62% function    COGNITION:  Overall cognitive status: Within functional limits for tasks assessed     SENSATION: WFL  POSTURE: Rounded shoulder   UPPER EXTREMITY ROM:   Bilateral shoulder AROM WFL but increased pain with LT shoulder IR, ER and abduction   UPPER EXTREMITY MMT:  MMT Right eval Left eval  Shoulder flexion 5 5  Shoulder extension    Shoulder abduction 5 4*  Shoulder adduction    Shoulder internal rotation 5 4*  Shoulder external rotation 5 4+  Middle trapezius    Lower trapezius    Elbow flexion    Elbow extension    Wrist flexion    Wrist extension    Wrist ulnar deviation    Wrist radial deviation    Wrist pronation    Wrist supination    Grip strength (lbs)    (Blank rows = not tested)  SHOULDER SPECIAL TESTS:  Impingement tests: Neer impingement test: positive , Hawkins/Kennedy impingement test: positive , and Painful arc test: positive   PALPATION:  Mod TTP about LT upper trap, posterolateral deltoid    TODAY'S TREATMENT:  02/01/22 TTP L UT, levator, infraspinatus Manual: STM to L UT and levator scapulae pre and post dry needling for trigger point identification and muscular relaxation. Trigger Point Dry-Needling  Treatment instructions: Expect mild to moderate muscle soreness. S/S of pneumothorax if dry needled over a lung field, and to seek immediate medical attention should they occur. Patient verbalized understanding of these instructions and education.  Patient Consent Given: Yes Education handout provided: Previously provided Muscles treated: Lt upper trap and levator Electrical stimulation performed: No Parameters: N/A Treatment response/outcome: Good tolerance UT stretch 2 x 30"  Scap retraction 10 x 5"  Scap retraction with GH ER GTB 2x 10 Horizontal abduction GTB 2x 10  Shoulder abduction with perpendicular resistance GTB 2x 10 Shoulder flexion with perpendicular resistance GTB 2x 10  Band row GTB 2 x10  Shoulder extension GTB 2 x  10  01/23/22 Goals Review  Manual STM to LT upper trap and levator pre and post dry needling for trigger point identification and surface area preparation   Trigger Point Dry-Needling  Treatment instructions: Expect mild to moderate muscle soreness. S/S of pneumothorax if dry needled over a lung field, and to seek immediate medical attention should they occur. Patient verbalized understanding of these instructions and education.  Patient Consent Given: Yes Education handout provided: Previously provided Muscles treated: Lt upper trap  and levator Electrical stimulation performed: No Parameters: N/A Treatment response/outcome: Good tolerance  UT stretch 2 x 30"  Scap retraction 10 x 5"  Seated shoulder ER GTB 2 x 10 Band row GTB 2 x10  Shoulder extension GTB 2 x 10  Eval UT stretch Scap retraction Seated shoulder ER RTB    PATIENT EDUCATION: Education details: 02/01/22; HEP, DN;  EVAL: on eval findings, POC and HEP  Person educated: Patient Education method: Explanation Education comprehension: verbalized understanding   HOME EXERCISE PROGRAM: Access Code: [email or web address] URL: [email or web address] 02/01/22 - Standing Shoulder Horizontal Abduction with Resistance  - 1 x daily - 7 x weekly - 2 sets - 10 reps  01/23/22 - Standing Shoulder Row with Anchored Resistance  - 1-2 x daily - 7 x weekly - 2 sets - 10 reps - 3-5 second hold - Shoulder extension with resistance - Neutral  - 1-2 x daily - 7 x weekly - 2 sets - 10 reps - 3-5 second hold  Date: 01/16/2022 Prepared by: Josue Hector  Exercises - Seated Scapular Retraction  - 2-3 x daily - 7 x weekly - 2 sets - 10 reps - 3-5 second hold - Seated Upper Trapezius Stretch  - 2-3 x daily - 7 x weekly - 1 sets - 4 reps - 20 second  hold - Shoulder External Rotation and Scapular Retraction with Resistance  - 2-3 x daily - 7 x weekly - 2 sets - 10 reps - 3-5 second hold  ASSESSMENT:  CLINICAL IMPRESSION: Patient  with continued TTP and trigger points in L UT/ levator scapulae. Continued with DN as patient stating positive results. Decrease in tissue tension following DN. Continued with postural and periscap strengthening. Patient will continue to benefit from physical therapy in order to improve function and reduce impairment.    OBJECTIVE IMPAIRMENTS decreased activity tolerance, decreased ROM, decreased strength, increased fascial restrictions, impaired flexibility, impaired UE functional use, improper body mechanics, postural dysfunction, and pain.   ACTIVITY LIMITATIONS carrying, lifting, reach over head, and caring for others  PARTICIPATION LIMITATIONS: meal prep, cleaning, laundry, driving, shopping, community activity, occupation, and yard work  PERSONAL FACTORS  None  are also affecting patient's functional outcome.   REHAB POTENTIAL: Good  CLINICAL DECISION MAKING: Stable/uncomplicated  EVALUATION COMPLEXITY: Low   GOALS: SHORT TERM GOALS: Target date: 02/06/2022  Patient will be independent with initial HEP and self-management strategies to improve functional outcomes Baseline:  Goal status: INITIAL   LONG TERM GOALS: Target date: 02/27/2022  Patient will be independent with advanced HEP and self-management strategies to improve functional outcomes Baseline:  Goal status: INITIAL  2.  Patient will improve FOTO score to predicted value to indicate improvement in functional outcomes Baseline: 62% function Goal status: INITIAL  3.  Patient will report at least 80% overall improvement in subjective complaint to indicate improvement in ability to perform ADLs. Baseline:  Goal status: INITIAL  4. Patient will report a decrease in LT shoulder pain to 0/10 at rest, and no more than 3/10 with activity for improved quality of life and ability to perform UE ADLs  Baseline: 4/10 Goal status: INITIAL  PLAN: PT FREQUENCY: 1-2x/week  PT DURATION: 6 weeks  PLANNED INTERVENTIONS:  Therapeutic exercises, Therapeutic activity, Neuromuscular re-education, Balance training, Gait training, Patient/Family education, Joint manipulation, Joint mobilization, Stair training, Aquatic Therapy, Dry Needling, Electrical stimulation, Spinal manipulation, Spinal mobilization, Cryotherapy, Moist heat, scar mobilization, Taping, Traction, Ultrasound, Biofeedback, Ionotophoresis '4mg'$ /ml Dexamethasone, and Manual therapy.   PLAN FOR NEXT SESSION:  F/U on DN. Progress postural and scapular strengthening as tolerated. Manual STM as needed.   8:16 AM, 02/01/22 Mearl Latin PT, DPT Physical Therapist at [facility]

## 2022-02-05 ENCOUNTER — Other Ambulatory Visit (HOSPITAL_COMMUNITY): Payer: Self-pay

## 2022-02-07 ENCOUNTER — Encounter (HOSPITAL_COMMUNITY): Payer: Self-pay | Admitting: Physical Therapy

## 2022-02-07 ENCOUNTER — Ambulatory Visit (HOSPITAL_COMMUNITY): Payer: 59 | Admitting: Physical Therapy

## 2022-02-07 DIAGNOSIS — M25512 Pain in left shoulder: Secondary | ICD-10-CM | POA: Diagnosis not present

## 2022-02-07 NOTE — Therapy (Signed)
OUTPATIENT PHYSICAL THERAPY SHOULDER TREATMENT   Patient Name: [name] MRN: [id number] DOB:07/15/1975, 46 y.o., female Today's Date: 02/07/2022   PT End of Session - 02/07/22 1436     Visit Number 4    Number of Visits 12    Date for PT Re-Evaluation 02/27/22    Authorization Type [employer]    Progress Note Due on Visit 10    PT Start Time 1435    PT Stop Time 1515    PT Time Calculation (min) 40 min    Activity Tolerance Patient tolerated treatment well    Behavior During Therapy [facility] for tasks assessed/performed             Past Medical History:  Diagnosis Date   Allergy    Anemia    Arthritis    Diabetes mellitus    type II    Educated about COVID-19 virus infection 09/07/2018   Family history of colonic polyps    GERD (gastroesophageal reflux disease)    Iron deficiency anemia 09/03/2007   Qualifier: Diagnosis of  By: Rennie Plowman     PONV (postoperative nausea and vomiting)    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   Past Surgical History:  Procedure Laterality Date   CARDIAC CATHETERIZATION     CESAREAN SECTION     x 2   COLONOSCOPY WITH PROPOFOL N/A 04/20/2019   Procedure: COLONOSCOPY WITH PROPOFOL;  Surgeon: Thornton Park, MD;  Location: [facility];  Service: Gastroenterology;  Laterality: N/A;   LAPAROSCOPIC GASTRIC SLEEVE RESECTION N/A 04/02/2016   Procedure: LAPAROSCOPIC GASTRIC SLEEVE RESECTION, UPPER ENDO;  Surgeon: Johnathan Hausen, MD;  Location: [facility];  Service: General;  Laterality: N/A;   POLYPECTOMY  04/20/2019   Procedure: POLYPECTOMY;  Surgeon: Thornton Park, MD;  Location: [facility];  Service: Gastroenterology;;   Patient Active Problem List   Diagnosis Date Noted   GAD (generalized anxiety disorder) 09/19/2021   Sciatica, left side 08/30/2020   Knee pain, right 05/21/2019   Adenomatous polyp of ascending colon    Dyslipidemia 09/07/2018   Morbid obesity ([place]) 09/12/2015   Vitamin B 12  deficiency 11/22/2011   Vitamin D deficiency 11/22/2011   Diabetes mellitus type 2 in obese 09/03/2007   Hyperlipidemia LDL goal <100 09/03/2007   Allergic rhinitis 09/03/2007   GERD 09/03/2007    PCP: Tula Nakayama MD  REFERRING PROVIDER: Carole Civil, MD   REFERRING DIAG: [phone] (ICD-10-CM) - Chronic left shoulder pain M75.42 (ICD-10-CM) - Impingement syndrome of left shoulder M25.512 (ICD-10-CM) - Trigger point of left shoulder region   THERAPY DIAG:  Left shoulder pain, unspecified chronicity  Rationale for Evaluation and Treatment Rehabilitation  ONSET DATE: About a month   SUBJECTIVE:  SUBJECTIVE STATEMENT: Some periods of achiness, some catch in shoulder blade region with movement. Some pain in shoulder  PERTINENT HISTORY: NA  PAIN:  Are you having pain? Yes: NPRS scale: 0 at rest/10 Pain location: Lt shoulder  Pain description: tingling, burning, aching Aggravating factors: Reaching back, reaching outward, reaching across body  Relieving factors: Rest, meds   PRECAUTIONS: None  WEIGHT BEARING RESTRICTIONS No  FALLS:  Has patient fallen in last 6 months? 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 Mild tendinosis of the supraspinatus tendon with irregularity along the anterior bursal surface. 2. Mild tendinosis of the infraspinatus tendon. 3. Mild tendinosis of the subscapularis tendon.    PATIENT SURVEYS:  FOTO 62% function   COGNITION:  Overall cognitive status: Within  functional limits for tasks assessed     SENSATION: WFL  POSTURE: Rounded shoulder   UPPER EXTREMITY ROM:   Bilateral shoulder AROM WFL but increased pain with LT shoulder IR, ER and abduction   UPPER EXTREMITY MMT:  MMT Right eval Left eval  Shoulder flexion 5 5  Shoulder extension    Shoulder abduction 5 4*  Shoulder adduction    Shoulder internal rotation 5 4*  Shoulder external rotation 5 4+  Middle trapezius    Lower trapezius    Elbow flexion    Elbow extension    Wrist flexion    Wrist extension    Wrist ulnar deviation    Wrist radial deviation    Wrist pronation    Wrist supination    Grip strength (lbs)    (Blank rows = not tested)  SHOULDER SPECIAL TESTS:  Impingement tests: Neer impingement test: positive , Hawkins/Kennedy impingement test: positive , and Painful arc test: positive   PALPATION:  Mod TTP about LT upper trap, posterolateral deltoid    TODAY'S TREATMENT:  02/07/22 Self STM with tennis ball to rhomboids, middle trap cap retraction with GH ER BTB 2x 10 Horizontal abduction BTB 2x 10  PNF D2 BTB 2x 10 bilateral  Band row BTB 2 x10  Shoulder extension BTB 2 x 10 Shoulder abduction with perpendicular resistance BTB 2x 10 Shoulder flexion with perpendicular resistance BTB 2x 10  Thoracic extension over chair 10 x 5 second holds x 2 sets Prone shoulder extension 2 x 10  Prone horizontal abduction 2x 10    02/01/22 TTP L UT, levator, infraspinatus Manual: STM to L UT and levator scapulae pre and post dry needling for trigger point identification and muscular relaxation. Trigger Point Dry-Needling  Treatment instructions: Expect mild to moderate muscle soreness. S/S of pneumothorax if dry needled over a lung field, and to seek immediate medical attention should they occur. Patient verbalized understanding of these instructions and education.  Patient Consent Given: Yes Education handout provided: Previously provided Muscles treated: Lt  upper trap and levator Electrical stimulation performed: No Parameters: N/A Treatment response/outcome: Good tolerance UT stretch 2 x 30"  Scap retraction 10 x 5"  Scap retraction with [place] ER GTB 2x 10 Horizontal abduction GTB 2x 10  Shoulder abduction with perpendicular resistance GTB 2x 10 Shoulder flexion with perpendicular resistance GTB 2x 10  Band row GTB 2 x10  Shoulder extension GTB 2 x 10  01/23/22 Goals  Review  Manual STM to LT upper trap and levator pre and post dry needling for trigger point identification and surface area preparation   Trigger Point Dry-Needling  Treatment instructions: Expect mild to moderate muscle soreness. S/S of pneumothorax if dry needled over a lung field, and to seek immediate medical attention should they occur. Patient verbalized understanding of these instructions and education.  Patient Consent Given: Yes Education handout provided: Previously provided Muscles treated: Lt upper trap and levator Electrical stimulation performed: No Parameters: N/A Treatment response/outcome: Good tolerance  UT stretch 2 x 30"  Scap retraction 10 x 5"  Seated shoulder ER GTB 2 x 10 Band row GTB 2 x10  Shoulder extension GTB 2 x 10  Eval UT stretch Scap retraction Seated shoulder ER RTB    PATIENT EDUCATION: Education details: 02/01/22; HEP, DN;  EVAL: on eval findings, POC and HEP  Person educated: Patient Education method: Explanation Education comprehension: verbalized understanding   HOME EXERCISE PROGRAM: Access Code: [email or web address] URL: [email or web address] 02/07/22 - Standing Shoulder Single Arm PNF D2 Flexion with Resistance (Mirrored)  - 1 x daily - 7 x weekly - 2 sets - 10 reps - Prone Shoulder Extension - Single Arm (Mirrored)  - 1 x daily - 7 x weekly - 2 sets - 10 reps - Prone Shoulder Horizontal Abduction (Mirrored)  - 1 x daily - 7 x weekly - 2 sets - 10 reps  02/01/22 - Standing Shoulder Horizontal Abduction with  Resistance  - 1 x daily - 7 x weekly - 2 sets - 10 reps  01/23/22 - Standing Shoulder Row with Anchored Resistance  - 1-2 x daily - 7 x weekly - 2 sets - 10 reps - 3-5 second hold - Shoulder extension with resistance - Neutral  - 1-2 x daily - 7 x weekly - 2 sets - 10 reps - 3-5 second hold  Date: 01/16/2022 Prepared by: Josue Hector  Exercises - Seated Scapular Retraction  - 2-3 x daily - 7 x weekly - 2 sets - 10 reps - 3-5 second hold - Seated Upper Trapezius Stretch  - 2-3 x daily - 7 x weekly - 1 sets - 4 reps - 20 second  hold - Shoulder External Rotation and Scapular Retraction with Resistance  - 2-3 x daily - 7 x weekly - 2 sets - 10 reps - 3-5 second hold  ASSESSMENT:  CLINICAL IMPRESSION: Patient with tenderness in L rhombioids/middle trap. Performs self STM with tennis ball. Continued with RC and periscap strengthening which is tolerated well. BTB issues for HEP and updated handouts. Patient will continue to benefit from physical therapy in order to improve function and reduce impairment.    OBJECTIVE IMPAIRMENTS decreased activity tolerance, decreased ROM, decreased strength, increased fascial restrictions, impaired flexibility, impaired UE functional use, improper body mechanics, postural dysfunction, and pain.   ACTIVITY LIMITATIONS carrying, lifting, reach over head, and caring for others  PARTICIPATION LIMITATIONS: meal prep, cleaning, laundry, driving, shopping, community activity, occupation, and yard work  PERSONAL FACTORS  None  are also affecting patient's functional outcome.   REHAB POTENTIAL: Good  CLINICAL DECISION MAKING: Stable/uncomplicated  EVALUATION COMPLEXITY: Low   GOALS: SHORT TERM GOALS: Target date: 02/06/2022  Patient will be independent with initial HEP and self-management strategies to improve functional outcomes Baseline:  Goal status: INITIAL   LONG TERM GOALS: Target date: 02/27/2022  Patient will be independent with advanced HEP  and self-management strategies to improve functional outcomes Baseline:  Goal status: INITIAL  2.  Patient will improve FOTO score to predicted value to indicate improvement in functional outcomes Baseline: 62% function Goal status: INITIAL  3.  Patient will report at least 80% overall improvement in subjective complaint to indicate improvement in ability to perform ADLs. Baseline:  Goal status: INITIAL  4. Patient will report a decrease in LT shoulder pain to 0/10 at rest, and no more than 3/10 with activity for improved quality of life and ability to perform UE ADLs  Baseline: 4/10 Goal status: INITIAL  PLAN: PT FREQUENCY: 1-2x/week  PT DURATION: 6 weeks  PLANNED INTERVENTIONS: Therapeutic exercises, Therapeutic activity, Neuromuscular re-education, Balance training, Gait training, Patient/Family education, Joint manipulation, Joint mobilization, Stair training, Aquatic Therapy, Dry Needling, Electrical stimulation, Spinal manipulation, Spinal mobilization, Cryotherapy, Moist heat, scar mobilization, Taping, Traction, Ultrasound, Biofeedback, Ionotophoresis '4mg'$ /ml Dexamethasone, and Manual therapy.   PLAN FOR NEXT SESSION: F/U on DN. Progress postural and scapular strengthening as tolerated. Manual STM as needed.   2:37 PM, 02/07/22 Mearl Latin PT, DPT Physical Therapist at [facility]

## 2022-02-08 ENCOUNTER — Encounter (HOSPITAL_COMMUNITY): Payer: 59 | Admitting: Physical Therapy

## 2022-02-14 ENCOUNTER — Encounter (HOSPITAL_COMMUNITY): Payer: 59 | Admitting: Physical Therapy

## 2022-02-15 ENCOUNTER — Encounter (HOSPITAL_COMMUNITY): Payer: 59 | Admitting: Physical Therapy

## 2022-02-20 ENCOUNTER — Ambulatory Visit (HOSPITAL_COMMUNITY): Payer: 59 | Admitting: Physical Therapy

## 2022-02-20 ENCOUNTER — Encounter (HOSPITAL_COMMUNITY): Payer: Self-pay | Admitting: Physical Therapy

## 2022-02-20 DIAGNOSIS — M25512 Pain in left shoulder: Secondary | ICD-10-CM

## 2022-02-20 NOTE — Therapy (Signed)
OUTPATIENT PHYSICAL THERAPY SHOULDER TREATMENT   Patient Name: [name] MRN: [id number] DOB:25-Mar-1976, 46 y.o., female Today's Date: 02/20/2022   PT End of Session - 02/20/22 1348     Visit Number 5    Number of Visits 12    Date for PT Re-Evaluation 02/27/22    Authorization Type [employer]    Progress Note Due on Visit 10    PT Start Time 1347    PT Stop Time 1427    PT Time Calculation (min) 40 min    Activity Tolerance Patient tolerated treatment well    Behavior During Therapy [facility] for tasks assessed/performed             Past Medical History:  Diagnosis Date   Allergy    Anemia    Arthritis    Diabetes mellitus    type II    Educated about COVID-19 virus infection 09/07/2018   Family history of colonic polyps    GERD (gastroesophageal reflux disease)    Iron deficiency anemia 09/03/2007   Qualifier: Diagnosis of  By: Rennie Plowman     PONV (postoperative nausea and vomiting)    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   Past Surgical History:  Procedure Laterality Date   CARDIAC CATHETERIZATION     CESAREAN SECTION     x 2   COLONOSCOPY WITH PROPOFOL N/A 04/20/2019   Procedure: COLONOSCOPY WITH PROPOFOL;  Surgeon: Thornton Park, MD;  Location: [facility];  Service: Gastroenterology;  Laterality: N/A;   LAPAROSCOPIC GASTRIC SLEEVE RESECTION N/A 04/02/2016   Procedure: LAPAROSCOPIC GASTRIC SLEEVE RESECTION, UPPER ENDO;  Surgeon: Johnathan Hausen, MD;  Location: [facility];  Service: General;  Laterality: N/A;   POLYPECTOMY  04/20/2019   Procedure: POLYPECTOMY;  Surgeon: Thornton Park, MD;  Location: [facility];  Service: Gastroenterology;;   Patient Active Problem List   Diagnosis Date Noted   GAD (generalized anxiety disorder) 09/19/2021   Sciatica, left side 08/30/2020   Knee pain, right 05/21/2019   Adenomatous polyp of ascending colon    Dyslipidemia 09/07/2018   Morbid obesity ([place]) 09/12/2015   Vitamin B 12  deficiency 11/22/2011   Vitamin D deficiency 11/22/2011   Diabetes mellitus type 2 in obese 09/03/2007   Hyperlipidemia LDL goal <100 09/03/2007   Allergic rhinitis 09/03/2007   GERD 09/03/2007    PCP: Tula Nakayama MD  REFERRING PROVIDER: Carole Civil, MD   REFERRING DIAG: [phone] (ICD-10-CM) - Chronic left shoulder pain M75.42 (ICD-10-CM) - Impingement syndrome of left shoulder M25.512 (ICD-10-CM) - Trigger point of left shoulder region   THERAPY DIAG:  Left shoulder pain, unspecified chronicity  Rationale for Evaluation and Treatment Rehabilitation  ONSET DATE: About a month   SUBJECTIVE:  SUBJECTIVE STATEMENT: Some things are better, some spots feel the same   PERTINENT HISTORY: NA  PAIN:  Are you having pain? Yes: NPRS scale: 1/10 Pain location: Lt shoulder  Pain description: tingling, burning, aching Aggravating factors: Reaching back, reaching outward, reaching across body  Relieving factors: Rest, meds   PRECAUTIONS: None  WEIGHT BEARING RESTRICTIONS No  FALLS:  Has patient fallen in last 6 months? 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 Mild tendinosis of the supraspinatus tendon with irregularity along the anterior bursal surface. 2. Mild tendinosis of the infraspinatus tendon. 3. Mild tendinosis of the subscapularis tendon.    PATIENT SURVEYS:  FOTO 62% function   COGNITION:  Overall cognitive status: Within functional limits for tasks  assessed     SENSATION: WFL  POSTURE: Rounded shoulder   UPPER EXTREMITY ROM:   Bilateral shoulder AROM WFL but increased pain with LT shoulder IR, ER and abduction   UPPER EXTREMITY MMT:  MMT Right eval Left eval  Shoulder flexion 5 5  Shoulder extension    Shoulder abduction 5 4*  Shoulder adduction    Shoulder internal rotation 5 4*  Shoulder external rotation 5 4+  Middle trapezius    Lower trapezius    Elbow flexion    Elbow extension    Wrist flexion    Wrist extension    Wrist ulnar deviation    Wrist radial deviation    Wrist pronation    Wrist supination    Grip strength (lbs)    (Blank rows = not tested)  SHOULDER SPECIAL TESTS:  Impingement tests: Neer impingement test: positive , Hawkins/Kennedy impingement test: positive , and Painful arc test: positive   PALPATION:  Mod TTP about LT upper trap, posterolateral deltoid    TODAY'S TREATMENT:  02/20/22 Manual STM to LT upper trap and deltoid pre and post dry needling for trigger point identification and surface area preparation   Manual: STM to LT UT and deltoid pre and post dry needling for trigger point identification and muscular relaxation. Trigger Point Dry-Needling  Treatment instructions: Expect mild to moderate muscle soreness. S/S of pneumothorax if dry needled over a lung field, and to seek immediate medical attention should they occur. Patient verbalized understanding of these instructions and education.  Patient Consent Given: Yes Education handout provided: Previously provided Muscles treated: Lt upper trap and levator Electrical stimulation performed: No Parameters: N/A Treatment response/outcome: Good tolerance  Prone shoulder extension 10 x 5" Prone Ts 10 x 5"  Standing: Rows BTB 2 x10  Shoulder extension BTB 2 x 10 Bilateral shoulder ER at wall BTB 2 x 10 Shoulder abduction BTB 2 x10   Seated upper trap stretch 2 x 30" Seated levator stretch 2 x 30"  02/07/22 Self STM  with tennis ball to rhomboids, middle trap cap retraction with GH ER BTB 2x 10 Horizontal abduction BTB 2x 10  PNF D2 BTB 2x 10 bilateral  Band row BTB 2 x10  Shoulder extension BTB 2 x 10 Shoulder abduction with perpendicular resistance BTB 2x 10 Shoulder flexion with perpendicular resistance BTB 2x 10  Thoracic extension over chair 10 x 5 second holds x 2 sets Prone shoulder extension 2 x 10  Prone horizontal  abduction 2x 10    02/01/22 TTP L UT, levator, infraspinatus Manual: STM to L UT and levator scapulae pre and post dry needling for trigger point identification and muscular relaxation. Trigger Point Dry-Needling  Treatment instructions: Expect mild to moderate muscle soreness. S/S of pneumothorax if dry needled over a lung field, and to seek immediate medical attention should they occur. Patient verbalized understanding of these instructions and education.  Patient Consent Given: Yes Education handout provided: Previously provided Muscles treated: Lt upper trap and levator Electrical stimulation performed: No Parameters: N/A Treatment response/outcome: Good tolerance UT stretch 2 x 30"  Scap retraction 10 x 5"  Scap retraction with GH ER GTB 2x 10 Horizontal abduction GTB 2x 10  Shoulder abduction with perpendicular resistance GTB 2x 10 Shoulder flexion with perpendicular resistance GTB 2x 10  Band row GTB 2 x10  Shoulder extension GTB 2 x 10  01/23/22 Goals Review  Manual STM to LT upper trap and levator pre and post dry needling for trigger point identification and surface area preparation   Trigger Point Dry-Needling  Treatment instructions: Expect mild to moderate muscle soreness. S/S of pneumothorax if dry needled over a lung field, and to seek immediate medical attention should they occur. Patient verbalized understanding of these instructions and education.  Patient Consent Given: Yes Education handout provided: Previously provided Muscles treated: Lt upper  trap and levator Electrical stimulation performed: No Parameters: N/A Treatment response/outcome: Good tolerance  UT stretch 2 x 30"  Scap retraction 10 x 5"  Seated shoulder ER GTB 2 x 10 Band row GTB 2 x10  Shoulder extension GTB 2 x 10  Eval UT stretch Scap retraction Seated shoulder ER RTB    PATIENT EDUCATION: Education details: 02/01/22; HEP, DN;  EVAL: on eval findings, POC and HEP  Person educated: Patient Education method: Explanation Education comprehension: verbalized understanding   HOME EXERCISE PROGRAM: Access Code: [email or web address] URL: [email or web address] 02/07/22 - Standing Shoulder Single Arm PNF D2 Flexion with Resistance (Mirrored)  - 1 x daily - 7 x weekly - 2 sets - 10 reps - Prone Shoulder Extension - Single Arm (Mirrored)  - 1 x daily - 7 x weekly - 2 sets - 10 reps - Prone Shoulder Horizontal Abduction (Mirrored)  - 1 x daily - 7 x weekly - 2 sets - 10 reps  02/01/22 - Standing Shoulder Horizontal Abduction with Resistance  - 1 x daily - 7 x weekly - 2 sets - 10 reps  01/23/22 - Standing Shoulder Row with Anchored Resistance  - 1-2 x daily - 7 x weekly - 2 sets - 10 reps - 3-5 second hold - Shoulder extension with resistance - Neutral  - 1-2 x daily - 7 x weekly - 2 sets - 10 reps - 3-5 second hold  Date: 01/16/2022 Prepared by: Josue Hector  Exercises - Seated Scapular Retraction  - 2-3 x daily - 7 x weekly - 2 sets - 10 reps - 3-5 second hold - Seated Upper Trapezius Stretch  - 2-3 x daily - 7 x weekly - 1 sets - 4 reps - 20 second  hold - Shoulder External Rotation and Scapular Retraction with Resistance  - 2-3 x daily - 7 x weekly - 2 sets - 10 reps - 3-5 second hold  ASSESSMENT:  CLINICAL IMPRESSION: Patient tolerated session well overall. Continued with established POC for scapular strengthening with good return. Performed dry needling and STM to upper trap and deltoid on LT. Ongoing palpable trigger points noted.  Reviewed  stretches from HEP for improved pain free cervical mobility. No pain following treatment. Patient will continue to benefit from skilled therapy services to reduce remaining deficits and improve functional ability.    OBJECTIVE IMPAIRMENTS decreased activity tolerance, decreased ROM, decreased strength, increased fascial restrictions, impaired flexibility, impaired UE functional use, improper body mechanics, postural dysfunction, and pain.   ACTIVITY LIMITATIONS carrying, lifting, reach over head, and caring for others  PARTICIPATION LIMITATIONS: meal prep, cleaning, laundry, driving, shopping, community activity, occupation, and yard work  PERSONAL FACTORS  None  are also affecting patient's functional outcome.   REHAB POTENTIAL: Good  CLINICAL DECISION MAKING: Stable/uncomplicated  EVALUATION COMPLEXITY: Low   GOALS: SHORT TERM GOALS: Target date: 02/06/2022  Patient will be independent with initial HEP and self-management strategies to improve functional outcomes Baseline:  Goal status: INITIAL   LONG TERM GOALS: Target date: 02/27/2022  Patient will be independent with advanced HEP and self-management strategies to improve functional outcomes Baseline:  Goal status: INITIAL  2.  Patient will improve FOTO score to predicted value to indicate improvement in functional outcomes Baseline: 62% function Goal status: INITIAL  3.  Patient will report at least 80% overall improvement in subjective complaint to indicate improvement in ability to perform ADLs. Baseline:  Goal status: INITIAL  4. Patient will report a decrease in LT shoulder pain to 0/10 at rest, and no more than 3/10 with activity for improved quality of life and ability to perform UE ADLs  Baseline: 4/10 Goal status: INITIAL  PLAN: PT FREQUENCY: 1-2x/week  PT DURATION: 6 weeks  PLANNED INTERVENTIONS: Therapeutic exercises, Therapeutic activity, Neuromuscular re-education, Balance training, Gait training,  Patient/Family education, Joint manipulation, Joint mobilization, Stair training, Aquatic Therapy, Dry Needling, Electrical stimulation, Spinal manipulation, Spinal mobilization, Cryotherapy, Moist heat, scar mobilization, Taping, Traction, Ultrasound, Biofeedback, Ionotophoresis '4mg'$ /ml Dexamethasone, and Manual therapy.   PLAN FOR NEXT SESSION: F/U on DN. Progress postural and scapular strengthening as tolerated. Manual STM as needed.   1:49 PM, 02/20/22 Josue Hector PT DPT  Physical Therapist with [facility]  [phone]

## 2022-02-22 ENCOUNTER — Ambulatory Visit (HOSPITAL_COMMUNITY): Payer: 59 | Attending: Family Medicine | Admitting: Physical Therapy

## 2022-02-22 DIAGNOSIS — M25512 Pain in left shoulder: Secondary | ICD-10-CM | POA: Diagnosis not present

## 2022-02-22 NOTE — Therapy (Signed)
OUTPATIENT PHYSICAL THERAPY SHOULDER TREATMENT   Patient Name: [name] MRN: [id number] DOB:1975/08/19, 46 y.o., female Today's Date: 02/22/2022   PT End of Session - 02/22/22 1519     Visit Number 6    Number of Visits 12    Date for PT Re-Evaluation 02/27/22    Authorization Type [employer]    Progress Note Due on Visit 10    PT Start Time 1518    PT Stop Time 1600    PT Time Calculation (min) 42 min    Activity Tolerance Patient tolerated treatment well    Behavior During Therapy [facility] for tasks assessed/performed             Past Medical History:  Diagnosis Date   Allergy    Anemia    Arthritis    Diabetes mellitus    type II    Educated about COVID-19 virus infection 09/07/2018   Family history of colonic polyps    GERD (gastroesophageal reflux disease)    Iron deficiency anemia 09/03/2007   Qualifier: Diagnosis of  By: Rennie Plowman     PONV (postoperative nausea and vomiting)    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   Past Surgical History:  Procedure Laterality Date   CARDIAC CATHETERIZATION     CESAREAN SECTION     x 2   COLONOSCOPY WITH PROPOFOL N/A 04/20/2019   Procedure: COLONOSCOPY WITH PROPOFOL;  Surgeon: Thornton Park, MD;  Location: [facility];  Service: Gastroenterology;  Laterality: N/A;   LAPAROSCOPIC GASTRIC SLEEVE RESECTION N/A 04/02/2016   Procedure: LAPAROSCOPIC GASTRIC SLEEVE RESECTION, UPPER ENDO;  Surgeon: Johnathan Hausen, MD;  Location: [facility];  Service: General;  Laterality: N/A;   POLYPECTOMY  04/20/2019   Procedure: POLYPECTOMY;  Surgeon: Thornton Park, MD;  Location: [facility];  Service: Gastroenterology;;   Patient Active Problem List   Diagnosis Date Noted   GAD (generalized anxiety disorder) 09/19/2021   Sciatica, left side 08/30/2020   Knee pain, right 05/21/2019   Adenomatous polyp of ascending colon    Dyslipidemia 09/07/2018   Morbid obesity ([place]) 09/12/2015   Vitamin B 12  deficiency 11/22/2011   Vitamin D deficiency 11/22/2011   Diabetes mellitus type 2 in obese 09/03/2007   Hyperlipidemia LDL goal <100 09/03/2007   Allergic rhinitis 09/03/2007   GERD 09/03/2007    PCP: Tula Nakayama MD  REFERRING PROVIDER: Carole Civil, MD   REFERRING DIAG: [phone] (ICD-10-CM) - Chronic left shoulder pain M75.42 (ICD-10-CM) - Impingement syndrome of left shoulder M25.512 (ICD-10-CM) - Trigger point of left shoulder region   THERAPY DIAG:  Left shoulder pain, unspecified chronicity  Rationale for Evaluation and Treatment Rehabilitation  ONSET DATE: About a month   SUBJECTIVE:  SUBJECTIVE STATEMENT: Had a rough day yesterday, not sure why. Had her husband massgae neck but didn't really help. Feels needling is helping. Had more nerve pain in LT shoulder and bicep laying in bed the other night. Took a motrin which helped a little.   PERTINENT HISTORY: NA  PAIN:  Are you having pain? Yes: NPRS scale: 3/10 Pain location: Lt shoulder  Pain description: tingling, burning, aching Aggravating factors: Reaching back, reaching outward, reaching across body  Relieving factors: Rest, meds   PRECAUTIONS: None  WEIGHT BEARING RESTRICTIONS No  FALLS:  Has patient fallen in last 6 months? 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 Mild tendinosis of the supraspinatus tendon with irregularity along the anterior bursal surface. 2. Mild tendinosis of the infraspinatus tendon. 3. Mild  tendinosis of the subscapularis tendon.    PATIENT SURVEYS:  FOTO 62% function   COGNITION:  Overall cognitive status: Within functional limits for tasks assessed     SENSATION: WFL  POSTURE: Rounded shoulder   UPPER EXTREMITY ROM:   Bilateral shoulder AROM WFL but increased pain with LT shoulder IR, ER and abduction   UPPER EXTREMITY MMT:  MMT Right eval Left eval  Shoulder flexion 5 5  Shoulder extension    Shoulder abduction 5 4*  Shoulder adduction    Shoulder internal rotation 5 4*  Shoulder external rotation 5 4+  Middle trapezius    Lower trapezius    Elbow flexion    Elbow extension    Wrist flexion    Wrist extension    Wrist ulnar deviation    Wrist radial deviation    Wrist pronation    Wrist supination    Grip strength (lbs)    (Blank rows = not tested)  SHOULDER SPECIAL TESTS:  Impingement tests: Neer impingement test: positive , Hawkins/Kennedy impingement test: positive , and Painful arc test: positive   PALPATION:  Mod TTP about LT upper trap, posterolateral deltoid    TODAY'S TREATMENT:  02/22/22 Chin tuck  x 10 Chin tuck with overpressure x10 (reduced pain) Chin tuck with extension x10 (reduced pain) Seated thoracic extension 15 x5"  BTB rows x20 BTB shoulder extension x20 Bilat shoulder ER BTB x 20 Shoulder horiz abuction (single arm) BTB x20 each BTB Ys standing (band anchored to door handle) 2 x 10 RTB  Pec stretch at wall 3 x 20" each  IASTM to LT upper trap and levator (patient seated)    02/20/22 Manual STM to LT upper trap and deltoid pre and post dry needling for trigger point identification and surface area preparation   Manual: STM to LT UT and deltoid pre and post dry needling for trigger point identification and muscular relaxation. Trigger Point Dry-Needling  Treatment instructions: Expect mild to moderate muscle soreness. S/S of pneumothorax if dry needled over a lung field, and to seek immediate medical attention  should they occur. Patient verbalized understanding of these instructions and education.  Patient Consent Given: Yes Education handout provided: Previously provided Muscles treated: Lt upper trap and levator Electrical stimulation performed: No Parameters: N/A Treatment response/outcome: Good tolerance  Prone shoulder extension 10 x 5" Prone Ts 10 x 5"  Standing: Rows BTB 2 x10  Shoulder extension  BTB 2 x 10 Bilateral shoulder ER at wall BTB 2 x 10 Shoulder abduction BTB 2 x10   Seated upper trap stretch 2 x 30" Seated levator stretch 2 x 30"     PATIENT EDUCATION: Education details: 02/01/22; HEP, DN;  EVAL: on eval findings, POC and HEP  Person educated: Patient Education method: Explanation Education comprehension: verbalized understanding   HOME EXERCISE PROGRAM: Access Code: [email or web address] URL: [email or web address]  02/22/22 - Single Arm Doorway Pec Stretch at 90 Degrees Abduction  - 1-2 x daily - 7 x weekly - 1 sets - 10 reps - 30 second hold - Seated Thoracic Lumbar Extension with Pectoralis Stretch  - 1-2 x daily - 7 x weekly - 1 sets - 10 reps - 5 second hold  02/07/22 - Standing Shoulder Single Arm PNF D2 Flexion with Resistance (Mirrored)  - 1 x daily - 7 x weekly - 2 sets - 10 reps - Prone Shoulder Extension - Single Arm (Mirrored)  - 1 x daily - 7 x weekly - 2 sets - 10 reps - Prone Shoulder Horizontal Abduction (Mirrored)  - 1 x daily - 7 x weekly - 2 sets - 10 reps  02/01/22 - Standing Shoulder Horizontal Abduction with Resistance  - 1 x daily - 7 x weekly - 2 sets - 10 reps  01/23/22 - Standing Shoulder Row with Anchored Resistance  - 1-2 x daily - 7 x weekly - 2 sets - 10 reps - 3-5 second hold - Shoulder extension with resistance - Neutral  - 1-2 x daily - 7 x weekly - 2 sets - 10 reps - 3-5 second hold  Date: 01/16/2022 Prepared by: Josue Hector  Exercises - Seated Scapular Retraction  - 2-3 x daily - 7 x weekly - 2 sets - 10 reps -  3-5 second hold - Seated Upper Trapezius Stretch  - 2-3 x daily - 7 x weekly - 1 sets - 4 reps - 20 second  hold - Shoulder External Rotation and Scapular Retraction with Resistance  - 2-3 x daily - 7 x weekly - 2 sets - 10 reps - 3-5 second hold  ASSESSMENT:  CLINICAL IMPRESSION: Good tolerance overall. Continued with postural and scapular strength progressions. Reduced LT arm/ shoulder symptoms with cervical retraction/ extension progressions. Ongoing restriction and trigger points noted in LT upper trap area. Improved symptoms following manual IASTM. Patient will continue to benefit from skilled therapy services to reduce remaining deficits and improve functional ability.    OBJECTIVE IMPAIRMENTS decreased activity tolerance, decreased ROM, decreased strength, increased fascial restrictions, impaired flexibility, impaired UE functional use, improper body mechanics, postural dysfunction, and pain.   ACTIVITY LIMITATIONS carrying, lifting, reach over head, and caring for others  PARTICIPATION LIMITATIONS: meal prep, cleaning, laundry, driving, shopping, community activity, occupation, and yard work  PERSONAL FACTORS  None  are also affecting patient's functional outcome.   REHAB POTENTIAL: Good  CLINICAL DECISION MAKING: Stable/uncomplicated  EVALUATION COMPLEXITY: Low   GOALS: SHORT TERM GOALS: Target date: 02/06/2022  Patient will be independent with initial HEP and self-management strategies to improve functional outcomes Baseline:  Goal status: INITIAL   LONG TERM GOALS: Target date: 02/27/2022  Patient will be independent with advanced HEP and self-management strategies to improve functional outcomes Baseline:  Goal status: INITIAL  2.  Patient will improve FOTO score to predicted value to indicate improvement in functional outcomes Baseline: 62% function Goal status: INITIAL  3.  Patient will report at least 80% overall improvement in subjective  complaint to indicate  improvement in ability to perform ADLs. Baseline:  Goal status: INITIAL  4. Patient will report a decrease in LT shoulder pain to 0/10 at rest, and no more than 3/10 with activity for improved quality of life and ability to perform UE ADLs  Baseline: 4/10 Goal status: INITIAL  PLAN: PT FREQUENCY: 1-2x/week  PT DURATION: 6 weeks  PLANNED INTERVENTIONS: Therapeutic exercises, Therapeutic activity, Neuromuscular re-education, Balance training, Gait training, Patient/Family education, Joint manipulation, Joint mobilization, Stair training, Aquatic Therapy, Dry Needling, Electrical stimulation, Spinal manipulation, Spinal mobilization, Cryotherapy, Moist heat, scar mobilization, Taping, Traction, Ultrasound, Biofeedback, Ionotophoresis '4mg'$ /ml Dexamethasone, and Manual therapy.   PLAN FOR NEXT SESSION: Reassess. Continue DN as indicated. Progress postural and scapular strengthening as tolerated. Manual STM as needed.   4:25 PM, 02/22/22 Josue Hector PT DPT  Physical Therapist with [facility]  [phone]

## 2022-02-27 ENCOUNTER — Ambulatory Visit (HOSPITAL_COMMUNITY): Payer: 59 | Attending: Orthopedic Surgery | Admitting: Physical Therapy

## 2022-02-27 DIAGNOSIS — M25512 Pain in left shoulder: Secondary | ICD-10-CM | POA: Insufficient documentation

## 2022-02-27 NOTE — Therapy (Signed)
OUTPATIENT PHYSICAL THERAPY SHOULDER TREATMENT   Patient Name: [name] MRN: [id number] DOB:[birth year], 46 y.o., female Today's Date: 02/27/2022 PHYSICAL THERAPY DISCHARGE SUMMARY  Visits from Start of Care: 7  Current functional level related to goals / functional outcomes: See below   Remaining deficits: See below    Education / Equipment: See assessment   Patient agrees to discharge. Patient goals were met. Patient is being discharged due to meeting the stated rehab goals.   PT End of Session - 02/27/22 1309     Visit Number 7    Number of Visits 12    Date for PT Re-Evaluation 02/27/22    Authorization Type [employer]    Progress Note Due on Visit 10    PT Start Time 1307    PT Stop Time 1340    PT Time Calculation (min) 33 min    Activity Tolerance Patient tolerated treatment well    Behavior During Therapy [facility] for tasks assessed/performed             Past Medical History:  Diagnosis Date   Allergy    Anemia    Arthritis    Diabetes mellitus    type II    Educated about COVID-19 virus infection 09/07/2018   Family history of colonic polyps    GERD (gastroesophageal reflux disease)    Iron deficiency anemia 09/03/2007   Qualifier: Diagnosis of  By: Rennie Plowman     PONV (postoperative nausea and vomiting)    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   Past Surgical History:  Procedure Laterality Date   CARDIAC CATHETERIZATION     CESAREAN SECTION     x 2   COLONOSCOPY WITH PROPOFOL N/A 04/20/2019   Procedure: COLONOSCOPY WITH PROPOFOL;  Surgeon: Thornton Park, MD;  Location: [facility];  Service: Gastroenterology;  Laterality: N/A;   LAPAROSCOPIC GASTRIC SLEEVE RESECTION N/A 04/02/2016   Procedure: LAPAROSCOPIC GASTRIC SLEEVE RESECTION, UPPER ENDO;  Surgeon: Johnathan Hausen, MD;  Location: [facility];  Service: General;  Laterality: N/A;   POLYPECTOMY  04/20/2019   Procedure: POLYPECTOMY;  Surgeon: Thornton Park, MD;   Location: [facility];  Service: Gastroenterology;;   Patient Active Problem List   Diagnosis Date Noted   GAD (generalized anxiety disorder) 09/19/2021   Sciatica, left side 08/30/2020   Knee pain, right 05/21/2019   Adenomatous polyp of ascending colon    Dyslipidemia 09/07/2018   Morbid obesity ([place]) 09/12/2015   Vitamin B 12 deficiency 11/22/2011   Vitamin D deficiency 11/22/2011   Diabetes mellitus type 2 in obese 09/03/2007   Hyperlipidemia LDL goal <100 09/03/2007   Allergic rhinitis 09/03/2007   GERD 09/03/2007    PCP: Tula Nakayama MD  REFERRING PROVIDER: Carole Civil, MD   REFERRING DIAG: [phone] (ICD-10-CM) - Chronic left shoulder pain M75.42 (ICD-10-CM) - Impingement syndrome of left shoulder M25.512 (ICD-10-CM) - Trigger point of left shoulder region   THERAPY DIAG:  Left shoulder pain, unspecified chronicity  Rationale for Evaluation and Treatment Rehabilitation  ONSET DATE: About a month   SUBJECTIVE:  SUBJECTIVE STATEMENT: No pain right now. Feels about 80% improved since starting therapy.   PERTINENT HISTORY: NA  PAIN:  Are you having pain? Yes: NPRS scale: 0/10 Pain location: Lt shoulder  Pain description: tingling, burning, aching Aggravating factors: Reaching back, reaching outward, reaching across body  Relieving factors: Rest, meds   PRECAUTIONS: None  WEIGHT BEARING RESTRICTIONS No  FALLS:  Has patient fallen in last 6 months? 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 Mild tendinosis of the supraspinatus tendon with irregularity along the anterior bursal surface. 2. Mild tendinosis of the infraspinatus tendon. 3. Mild tendinosis of the subscapularis tendon.    PATIENT SURVEYS:  FOTO 72% (was 62% function)   COGNITION:  Overall cognitive status: Within functional limits for tasks assessed     SENSATION: WFL  POSTURE: Rounded shoulder   UPPER EXTREMITY ROM:   Bilateral shoulder AROM WFL but increased pain with LT shoulder IR, ER and abduction   UPPER EXTREMITY MMT:  MMT Right eval Left eval Left 02/27/22  Shoulder flexion _0 Shoulder extension     Shoulder abduction 5 4* 4*  Shoulder adduction     Shoulder internal rotation 5 4* 5  Shoulder external rotation 5 4+ 5  Middle trapezius     Lower trapezius     Elbow flexion     Elbow extension     Wrist flexion     Wrist extension     Wrist ulnar deviation     Wrist radial deviation     Wrist pronation     Wrist supination     Grip strength (lbs)     (Blank rows = not tested)  SHOULDER SPECIAL TESTS:  Impingement tests: Neer impingement test: positive , Hawkins/Kennedy impingement test: positive , and Painful arc test: positive   PALPATION:  Mod TTP about LT upper trap, posterolateral deltoid    TODAY'S TREATMENT:  02/27/22 reassess HEP review  02/22/22 Chin tuck  x 10 Chin tuck with overpressure x10 (reduced pain) Chin tuck with extension x10 (reduced pain) Seated thoracic extension 15 x5"  BTB rows x20 BTB shoulder extension x20 Bilat shoulder ER BTB x 20 Shoulder horiz abuction (single arm) BTB x20 each BTB Ys standing (band anchored to door handle) 2 x 10 RTB  Pec stretch at wall 3 x 20" each  IASTM to LT upper trap and levator (patient seated)    02/20/22 Manual STM to LT upper trap and deltoid pre and post dry needling for trigger point identification and surface area preparation   Manual: STM to LT UT and deltoid pre and post dry  needling for trigger point identification and muscular relaxation. Trigger Point Dry-Needling  Treatment instructions: Expect mild to moderate muscle soreness. S/S of pneumothorax if dry needled over a lung field, and to seek immediate medical attention should they occur. Patient verbalized understanding of these instructions and education.  Patient Consent Given: Yes Education handout provided: Previously provided Muscles treated: Lt upper trap and levator Electrical stimulation performed: No Parameters: N/A Treatment response/outcome: Good tolerance  Prone shoulder extension 10 x 5" Prone Ts 10 x 5"  Standing: Rows BTB 2 x10  Shoulder extension BTB 2 x 10 Bilateral shoulder  ER at wall BTB 2 x 10 Shoulder abduction BTB 2 x10   Seated upper trap stretch 2 x 30" Seated levator stretch 2 x 30"     PATIENT EDUCATION: Education details: 02/01/22; HEP, DN;  EVAL: on eval findings, POC and HEP  Person educated: Patient Education method: Explanation Education comprehension: verbalized understanding   HOME EXERCISE PROGRAM: Access Code: [email or web address] URL: [email or web address] 02/27/22 Access Code: [email or web address] URL: [email or web address] Date: 02/27/2022 Prepared by: Josue Hector  Exercises - Seated Scapular Retraction  - 2 x daily - 4-5 x weekly - 1 sets - 10 reps - 3-5 second hold - Seated Cervical Retraction  - 2 x daily - 4-5 x weekly - 1 sets - 10 reps - 3-5 second hold - Seated Cervical Retraction and Extension  - 2 x daily - 4-5 x weekly - 1 sets - 10 reps - Seated Thoracic Lumbar Extension with Pectoralis Stretch  - 2 x daily - 4-5 x weekly - 1 sets - 10 reps - 5 second hold - Prone Shoulder Horizontal Abduction (Mirrored)  - 1 x daily - 4-5 x weekly - 2 sets - 10 reps - 5 sec hold - Prone Shoulder Extension - Single Arm (Mirrored)  - 1 x daily - 4-5 x weekly - 2 sets - 10 reps - 5 sec hold - Prone W Scapular Retraction  - 1 x daily - 4-5 x weekly  - 2 sets - 10 reps - 5 sec hold - Shoulder External Rotation and Scapular Retraction with Resistance  - 1 x daily - 4-5 x weekly - 2 sets - 10 reps - 3-5 second hold - Standing Shoulder Row with Anchored Resistance  - 1 x daily - 4-5 x weekly - 2 sets - 10 reps - 3-5 second hold - Shoulder extension with resistance - Neutral  - 1 x daily - 4-5 x weekly - 2 sets - 10 reps - 3-5 second hold - Standing Shoulder Horizontal Abduction with Resistance  - 1 x daily - 4-5 x weekly - 2 sets - 10 reps - Standing Shoulder Single Arm PNF D2 Flexion with Resistance (Mirrored)  - 1 x daily - 4-5 x weekly - 2 sets - 10 reps - Seated Upper Trapezius Stretch  - 2 x daily - 4-5 x weekly - 1 sets - 4 reps - 30 second  hold - Single Arm Doorway Pec Stretch at 90 Degrees Abduction  - 2 x daily - 4-5 x weekly - 1 sets - 4 reps - 30 second hold  02/22/22 - Single Arm Doorway Pec Stretch at 90 Degrees Abduction  - 1-2 x daily - 7 x weekly - 1 sets - 10 reps - 30 second hold - Seated Thoracic Lumbar Extension with Pectoralis Stretch  - 1-2 x daily - 7 x weekly - 1 sets - 10 reps - 5 second hold  02/07/22 - Standing Shoulder Single Arm PNF D2 Flexion with Resistance (Mirrored)  - 1 x daily - 7 x weekly - 2 sets - 10 reps - Prone Shoulder Extension - Single Arm (Mirrored)  - 1 x daily - 7 x weekly - 2 sets - 10 reps - Prone Shoulder Horizontal Abduction (Mirrored)  - 1 x daily - 7 x weekly - 2 sets - 10 reps  02/01/22 - Standing Shoulder Horizontal Abduction with Resistance  - 1 x daily - 7 x weekly - 2 sets - 10 reps  01/23/22 - Standing Shoulder Row with Anchored Resistance  - 1-2  x daily - 7 x weekly - 2 sets - 10 reps - 3-5 second hold - Shoulder extension with resistance - Neutral  - 1-2 x daily - 7 x weekly - 2 sets - 10 reps - 3-5 second hold  Date: 01/16/2022 Prepared by: Josue Hector  Exercises - Seated Scapular Retraction  - 2-3 x daily - 7 x weekly - 2 sets - 10 reps - 3-5 second hold - Seated Upper  Trapezius Stretch  - 2-3 x daily - 7 x weekly - 1 sets - 4 reps - 20 second  hold - Shoulder External Rotation and Scapular Retraction with Resistance  - 2-3 x daily - 7 x weekly - 2 sets - 10 reps - 3-5 second hold  ASSESSMENT:  CLINICAL IMPRESSION: Patient shows good progress and has currently met all long term goals. Pain improved, no pain at rest and describes only shoulder pain with certain movements of LT arm (such as abduction and internal rotation of shoulder). Reviewed comprehensive HEP and answered all questions planning for DC to independent with home program today. Encouraged patient to follow up with therapy services with any further questions or concerns.    OBJECTIVE IMPAIRMENTS decreased activity tolerance, decreased ROM, decreased strength, increased fascial restrictions, impaired flexibility, impaired UE functional use, improper body mechanics, postural dysfunction, and pain.   ACTIVITY LIMITATIONS carrying, lifting, reach over head, and caring for others  PARTICIPATION LIMITATIONS: meal prep, cleaning, laundry, driving, shopping, community activity, occupation, and yard work  PERSONAL FACTORS  None  are also affecting patient's functional outcome.   REHAB POTENTIAL: Good  CLINICAL DECISION MAKING: Stable/uncomplicated  EVALUATION COMPLEXITY: Low   GOALS: SHORT TERM GOALS: Target date: 02/06/2022  Patient will be independent with initial HEP and self-management strategies to improve functional outcomes Baseline:  Goal status: MET   LONG TERM GOALS: Target date: 02/27/2022  Patient will be independent with advanced HEP and self-management strategies to improve functional outcomes Baseline:  Goal status: MET  2.  Patient will improve FOTO score to predicted value to indicate improvement in functional outcomes Baseline: 72% function Goal status: MET  3.  Patient will report at least 80% overall improvement in subjective complaint to indicate improvement in  ability to perform ADLs. Baseline: Reports 80% Goal status: MET  4. Patient will report a decrease in LT shoulder pain to 0/10 at rest, and no more than 3/10 with activity for improved quality of life and ability to perform UE ADLs  Baseline: 0/10 at rest, 1-2/10 with work activity  Goal status: MET  PLAN: PT FREQUENCY: 1-2x/week  PT DURATION: 6 weeks  PLANNED INTERVENTIONS: Therapeutic exercises, Therapeutic activity, Neuromuscular re-education, Balance training, Gait training, Patient/Family education, Joint manipulation, Joint mobilization, Stair training, Aquatic Therapy, Dry Needling, Electrical stimulation, Spinal manipulation, Spinal mobilization, Cryotherapy, Moist heat, scar mobilization, Taping, Traction, Ultrasound, Biofeedback, Ionotophoresis 66m/ml Dexamethasone, and Manual therapy.   PLAN FOR NEXT SESSION: DC to HEP   1:41 PM, 02/27/22 CJosue HectorPT DPT  [facility] ([phone]

## 2022-03-01 ENCOUNTER — Encounter (HOSPITAL_COMMUNITY): Payer: 59 | Admitting: Physical Therapy

## 2022-03-14 ENCOUNTER — Other Ambulatory Visit: Payer: Self-pay | Admitting: Family Medicine

## 2022-03-14 ENCOUNTER — Other Ambulatory Visit (HOSPITAL_COMMUNITY): Payer: Self-pay

## 2022-03-14 MED ORDER — FLUTICASONE PROPIONATE 50 MCG/ACT NA SUSP
1.0000 | Freq: Two times a day (BID) | NASAL | 2 refills | Status: DC
  Filled 2022-03-14: qty 16, 30d supply, fill #0

## 2022-03-14 MED ORDER — FLUTICASONE PROPIONATE 50 MCG/ACT NA SUSP
1.0000 | Freq: Two times a day (BID) | NASAL | 6 refills | Status: DC
  Filled 2022-03-14: qty 16, 60d supply, fill #0
  Filled 2022-05-03: qty 16, 30d supply, fill #0
  Filled 2022-05-31: qty 16, 30d supply, fill #1
  Filled 2022-08-03: qty 16, 30d supply, fill #2
  Filled 2022-08-06: qty 16, 30d supply, fill #0
  Filled 2022-08-29 – 2022-08-30 (×2): qty 16, 30d supply, fill #1
  Filled 2022-11-17: qty 16, 30d supply, fill #2
  Filled 2023-01-16: qty 16, 30d supply, fill #3
  Filled 2023-02-13: qty 16, 30d supply, fill #4

## 2022-03-16 ENCOUNTER — Other Ambulatory Visit (HOSPITAL_COMMUNITY): Payer: Self-pay

## 2022-04-04 ENCOUNTER — Other Ambulatory Visit (HOSPITAL_COMMUNITY): Payer: Self-pay | Admitting: Family Medicine

## 2022-04-04 DIAGNOSIS — Z1231 Encounter for screening mammogram for malignant neoplasm of breast: Secondary | ICD-10-CM

## 2022-04-06 ENCOUNTER — Ambulatory Visit (HOSPITAL_COMMUNITY)
Admission: RE | Admit: 2022-04-06 | Discharge: 2022-04-06 | Disposition: A | Discharge status: Home / Self Care | Payer: 59 | Source: Ambulatory Visit | Attending: Family Medicine | Admitting: Family Medicine

## 2022-04-06 ENCOUNTER — Inpatient Hospital Stay (HOSPITAL_COMMUNITY): Admission: RE | Admit: 2022-04-06 | Payer: 59 | Source: Ambulatory Visit

## 2022-04-06 ENCOUNTER — Encounter (HOSPITAL_COMMUNITY): Payer: Self-pay

## 2022-04-06 DIAGNOSIS — Z1231 Encounter for screening mammogram for malignant neoplasm of breast: Secondary | ICD-10-CM | POA: Diagnosis not present

## 2022-04-09 ENCOUNTER — Other Ambulatory Visit: Payer: Self-pay

## 2022-04-09 ENCOUNTER — Other Ambulatory Visit (HOSPITAL_COMMUNITY): Payer: Self-pay

## 2022-04-10 ENCOUNTER — Other Ambulatory Visit: Payer: Self-pay

## 2022-04-18 ENCOUNTER — Encounter: Payer: Self-pay | Admitting: Orthopedic Surgery

## 2022-04-19 ENCOUNTER — Other Ambulatory Visit (HOSPITAL_COMMUNITY): Payer: Self-pay

## 2022-04-19 ENCOUNTER — Encounter: Payer: Self-pay | Admitting: Orthopedic Surgery

## 2022-04-19 ENCOUNTER — Ambulatory Visit (INDEPENDENT_AMBULATORY_CARE_PROVIDER_SITE_OTHER): Payer: 59

## 2022-04-19 ENCOUNTER — Ambulatory Visit (INDEPENDENT_AMBULATORY_CARE_PROVIDER_SITE_OTHER): Payer: 59 | Admitting: Orthopedic Surgery

## 2022-04-19 DIAGNOSIS — M25512 Pain in left shoulder: Secondary | ICD-10-CM

## 2022-04-19 DIAGNOSIS — S40012A Contusion of left shoulder, initial encounter: Secondary | ICD-10-CM

## 2022-04-19 DIAGNOSIS — S43109A Unspecified dislocation of unspecified acromioclavicular joint, initial encounter: Secondary | ICD-10-CM

## 2022-04-19 MED ORDER — IBUPROFEN 800 MG PO TABS
800.0000 mg | ORAL_TABLET | Freq: Three times a day (TID) | ORAL | 1 refills | Status: AC | PRN
  Filled 2022-04-19 – 2022-04-20 (×3): qty 90, 30d supply, fill #0

## 2022-04-19 NOTE — Progress Notes (Signed)
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  She has not completely recovered from the rotator cuff syndrome although she had significantly improved and had an MRI showing there was no tear  Since the fall she has trouble reaching her bra strap and reaching overhead and has pain in the back of the arm along the humerus and then some anterior lateral and deltoid pain as well  Examination reveals good cuff strength in abduction but pain she is tender over the [facility] joint anterolateral acromion and upper lateral arm  Imaging was obtained to rule out fracture  Imaging is negative  Encounter Diagnoses  Name Primary?   Acute pain of left shoulder Yes   Contusion of left shoulder, initial encounter    Acromioclavicular joint separation, type 1, initial encounter    Meds ordered this encounter  Medications   ibuprofen (ADVIL) 800 MG tablet    Sig: Take 1 tablet (800 mg total) by mouth every 8 (eight) hours as needed.    Dispense:  90 tablet    Refill:  1    Return in 2 weeks  Avoid heavy lifting

## 2022-04-20 ENCOUNTER — Other Ambulatory Visit: Payer: Self-pay

## 2022-04-20 ENCOUNTER — Other Ambulatory Visit (HOSPITAL_COMMUNITY): Payer: Self-pay

## 2022-04-25 ENCOUNTER — Ambulatory Visit: Payer: 59 | Admitting: Family Medicine

## 2022-04-26 ENCOUNTER — Other Ambulatory Visit (HOSPITAL_COMMUNITY): Payer: Self-pay

## 2022-04-26 ENCOUNTER — Ambulatory Visit (INDEPENDENT_AMBULATORY_CARE_PROVIDER_SITE_OTHER): Payer: Commercial Managed Care - PPO | Admitting: Family Medicine

## 2022-04-26 ENCOUNTER — Encounter: Payer: Self-pay | Admitting: Family Medicine

## 2022-04-26 VITALS — BP 104/69 | HR 71 | Ht 62.5 in | Wt 299.0 lb

## 2022-04-26 DIAGNOSIS — E1169 Type 2 diabetes mellitus with other specified complication: Secondary | ICD-10-CM

## 2022-04-26 DIAGNOSIS — E669 Obesity, unspecified: Secondary | ICD-10-CM

## 2022-04-26 DIAGNOSIS — E785 Hyperlipidemia, unspecified: Secondary | ICD-10-CM

## 2022-04-26 DIAGNOSIS — F411 Generalized anxiety disorder: Secondary | ICD-10-CM

## 2022-04-26 DIAGNOSIS — E559 Vitamin D deficiency, unspecified: Secondary | ICD-10-CM | POA: Diagnosis not present

## 2022-04-26 MED ORDER — TIRZEPATIDE 10 MG/0.5ML ~~LOC~~ SOAJ
10.0000 mg | SUBCUTANEOUS | 2 refills | Status: DC
  Filled 2022-04-26: qty 2, 28d supply, fill #0
  Filled 2022-04-27: qty 6, 84d supply, fill #0
  Filled 2022-05-03: qty 2, 28d supply, fill #0
  Filled 2022-05-31: qty 2, 28d supply, fill #1
  Filled 2022-07-07 – 2022-07-09 (×2): qty 2, 28d supply, fill #2
  Filled 2022-08-03: qty 2, 28d supply, fill #3

## 2022-04-26 NOTE — Assessment & Plan Note (Addendum)
  Patient re-educated about  the importance of commitment to a  minimum of 150 minutes of exercise per week as able.  The importance of healthy food choices with portion control discussed, as well as eating regularly and within a 12 hour window most days. The need to choose "clean , green" food 50 to 75% of the time is discussed, as well as to make water the primary drink and set a goal of 64 ounces water daily.       04/26/2022    4:21 PM 01/25/2022    3:01 PM 01/23/2022   10:17 AM  Weight /BMI  Weight 299 lb 0.6 oz 302 lb 302 lb  Height 5' 2.5" (1.588 m) 5' 2.5" (1.588 m) 5' 2.5" (1.588 m)  BMI 53.82 kg/m2 54.36 kg/m2 54.36 kg/m2    Inc mounjaro dose

## 2022-04-26 NOTE — Assessment & Plan Note (Signed)
Hyperlipidemia:Low fat diet discussed and encouraged.   Lipid Panel  Lab Results  Component Value Date   CHOL 174 01/23/2022   HDL 62 01/23/2022   LDLCALC 101 (H) 01/23/2022   TRIG 54 01/23/2022   CHOLHDL 2.8 01/23/2022     Updated lab needed at/ before next visit. Needs to lower fat intake

## 2022-04-26 NOTE — Progress Notes (Signed)
[name]     MRN: [id number]      DOB: [birth year]   HPI [name] is here for follow up and re-evaluation of chronic medical conditions, medication management and review of any available recent lab and radiology data.  Preventive health is updated, specifically  Cancer screening and Immunization.   Questions or concerns regarding consultations or procedures which the PT has had in the interim are  addressed. The PT denies any adverse reactions to current medications since the last visit.  Felon right shoulder several months ago still has pain and limitation in mobility Denies polyuria, polydipsia, blurred vision , or hypoglycemic episodes. No weight loss since last visit ROS Denies recent fever or chills. Denies sinus pressure, nasal congestion, ear pain or sore throat. Denies chest congestion, productive cough or wheezing. Denies chest pains, palpitations and leg swelling Denies abdominal pain, nausea, vomiting,diarrhea or constipation.   Denies dysuria, frequency, hesitancy or incontinence. Denies headaches, seizures, numbness, or tingling. Denies depression, anxiety or insomnia. Denies skin break down or rash.   PE  BP 104/69 (BP Location: Right Arm, Patient Position: Sitting, Cuff Size: Large)   Pulse 71   Ht 5' 2.5" (1.588 m)   Wt 299 lb 0.6 oz (135.6 kg)   SpO2 99%   BMI 53.82 kg/m   Patient alert and oriented and in no cardiopulmonary distress.  HEENT: No facial asymmetry, EOMI,     Neck supple .  Chest: Clear to auscultation bilaterally.  CVS: S1, S2 no murmurs, no S3.Regular rate.  ABD: Soft non tender.   Ext: No edema  MS: Adequate ROM spine, hips and knees.Reduced in right shoulder  Skin: Intact, no ulcerations or rash noted.  Psych: Good eye contact, normal affect. Memory intact not anxious or depressed appearing.  CNS: CN 2-12 intact, power,  normal throughout.no focal deficits noted.   Assessment & Plan Diabetes mellitus type 2 in  obese Updated lab . Increase dose mlounjaro and d/c metformin, goal of 10 pound weight lossin next 12 weeks, needs to increasee exrcise [name] is reminded of the importance of commitment to daily physical activity for 30 minutes or more, as able and the need to limit carbohydrate intake to 30 to 60 grams per meal to help with blood sugar control.   The need to take medication as prescribed, test blood sugar as directed, and to call between visits if there is a concern that blood sugar is uncontrolled is also discussed.   [name] is reminded of the importance of daily foot exam, annual eye examination, and good blood sugar, blood pressure and cholesterol control.     Latest Ref Rng & Units 01/23/2022   10:52 AM 09/15/2021    1:13 PM 05/15/2021   11:56 AM 01/25/2021    4:14 PM 10/12/2020    9:52 AM  Diabetic Labs  HbA1c 4.8 - 5.6 % 6.2  6.0  6.0  6.8    Micro/Creat Ratio 0 - 29 mg/g creat  5      Chol 100 - 199 mg/dL 174  145   178    HDL >39 mg/dL 62  59   63    Calc LDL 0 - 99 mg/dL 101  75   104    Triglycerides 0 - 149 mg/dL 54  51   55    Creatinine 0.57 - 1.00 mg/dL 0.54  0.62  0.60  0.63  0.61       04/26/2022    4:21  PM 01/25/2022    3:01 PM 01/23/2022   10:17 AM 12/07/2021    3:18 PM 09/19/2021   11:05 AM 05/19/2021   11:33 AM 03/17/2021    4:08 PM  BP/Weight  Systolic BP 673  419 379 024 097 353  Diastolic BP 69  72 84 76 73 80  Wt. (Lbs) 299.04 302 302 306 302.08 303.04 317.4  BMI 53.82 kg/m2 54.36 kg/m2 54.36 kg/m2 55.08 kg/m2 55.25 kg/m2 55.43 kg/m2 56.22 kg/m2      Latest Ref Rng & Units 01/23/2022   10:00 AM 02/09/2021   12:00 AM  Foot/eye exam completion dates  Eye Exam No Retinopathy  No Retinopathy      Foot Form Completion  Done      This result is from an external source.         Morbid obesity ([place])  Patient re-educated about  the importance of commitment to a  minimum of 150 minutes of exercise per week as able.  The importance of healthy food  choices with portion control discussed, as well as eating regularly and within a 12 hour window most days. The need to choose "clean , green" food 50 to 75% of the time is discussed, as well as to make water the primary drink and set a goal of 64 ounces water daily.       04/26/2022    4:21 PM 01/25/2022    3:01 PM 01/23/2022   10:17 AM  Weight /BMI  Weight 299 lb 0.6 oz 302 lb 302 lb  Height 5' 2.5" (1.588 m) 5' 2.5" (1.588 m) 5' 2.5" (1.588 m)  BMI 53.82 kg/m2 54.36 kg/m2 54.36 kg/m2    Inc mounjaro dose  Vitamin D deficiency Updated lab needed at/ before next visit.   Hyperlipidemia LDL goal <100 Hyperlipidemia:Low fat diet discussed and encouraged.   Lipid Panel  Lab Results  Component Value Date   CHOL 174 01/23/2022   HDL 62 01/23/2022   LDLCALC 101 (H) 01/23/2022   TRIG 54 01/23/2022   CHOLHDL 2.8 01/23/2022     Updated lab needed at/ before next visit. Needs to lower fat intake  GAD (generalized anxiety disorder) Resolved and on no medication

## 2022-04-26 NOTE — Assessment & Plan Note (Signed)
Resolved and on no medication

## 2022-04-26 NOTE — Assessment & Plan Note (Signed)
Updated lab . Increase dose mlounjaro and d/c metformin, goal of 10 pound weight lossin next 12 weeks, needs to increasee exrcise Ms. Pol is reminded of the importance of commitment to daily physical activity for 30 minutes or more, as able and the need to limit carbohydrate intake to 30 to 60 grams per meal to help with blood sugar control.   The need to take medication as prescribed, test blood sugar as directed, and to call between visits if there is a concern that blood sugar is uncontrolled is also discussed.   Ms. Malphrus is reminded of the importance of daily foot exam, annual eye examination, and good blood sugar, blood pressure and cholesterol control.     Latest Ref Rng & Units 01/23/2022   10:52 AM 09/15/2021    1:13 PM 05/15/2021   11:56 AM 01/25/2021    4:14 PM 10/12/2020    9:52 AM  Diabetic Labs  HbA1c 4.8 - 5.6 % 6.2  6.0  6.0  6.8    Micro/Creat Ratio 0 - 29 mg/g creat  5      Chol 100 - 199 mg/dL 174  145   178    HDL >39 mg/dL 62  59   63    Calc LDL 0 - 99 mg/dL 101  75   104    Triglycerides 0 - 149 mg/dL 54  51   55    Creatinine 0.57 - 1.00 mg/dL 0.54  0.62  0.60  0.63  0.61       04/26/2022    4:21 PM 01/25/2022    3:01 PM 01/23/2022   10:17 AM 12/07/2021    3:18 PM 09/19/2021   11:05 AM 05/19/2021   11:33 AM 03/17/2021    4:08 PM  BP/Weight  Systolic BP 970  263 785 885 027 741  Diastolic BP 69  72 84 76 73 80  Wt. (Lbs) 299.04 302 302 306 302.08 303.04 317.4  BMI 53.82 kg/m2 54.36 kg/m2 54.36 kg/m2 55.08 kg/m2 55.25 kg/m2 55.43 kg/m2 56.22 kg/m2      Latest Ref Rng & Units 01/23/2022   10:00 AM 02/09/2021   12:00 AM  Foot/eye exam completion dates  Eye Exam No Retinopathy  No Retinopathy      Foot Form Completion  Done      This result is from an external source.

## 2022-04-26 NOTE — Patient Instructions (Addendum)
F/u in 13 weeks, call if you need me sooner  Stop metformin  New higher dose mounjaro 10 mg weekly  Fasting lipid, cmp and EGFR 3 to 5 days before next appointment   Chem 7 and EGFR , HBA1C today  Thanks for choosing [facility], we consider it a privelige to serve you.

## 2022-04-26 NOTE — Assessment & Plan Note (Signed)
Updated lab needed at/ before next visit.   

## 2022-04-27 ENCOUNTER — Other Ambulatory Visit (HOSPITAL_COMMUNITY): Payer: Self-pay

## 2022-04-27 ENCOUNTER — Other Ambulatory Visit: Payer: Self-pay

## 2022-04-27 DIAGNOSIS — E559 Vitamin D deficiency, unspecified: Secondary | ICD-10-CM

## 2022-04-27 LAB — BMP8+EGFR
BUN/Creatinine Ratio: 15 (ref 9–23)
BUN: 11 mg/dL (ref 6–24)
CO2: 20 mmol/L (ref 20–29)
Calcium: 8.7 mg/dL (ref 8.7–10.2)
Chloride: 106 mmol/L (ref 96–106)
Creatinine, Ser: 0.74 mg/dL (ref 0.57–1.00)
Glucose: 98 mg/dL (ref 70–99)
Potassium: 5.4 mmol/L — ABNORMAL HIGH (ref 3.5–5.2)
Sodium: 143 mmol/L (ref 134–144)
eGFR: 100 mL/min/{1.73_m2} (ref >59)

## 2022-04-27 LAB — HEMOGLOBIN A1C
Est. average glucose Bld gHb Est-mCnc: 128 mg/dL
Hgb A1c MFr Bld: 6.1 % — ABNORMAL HIGH (ref 4.8–5.6)

## 2022-05-03 ENCOUNTER — Ambulatory Visit (INDEPENDENT_AMBULATORY_CARE_PROVIDER_SITE_OTHER): Payer: Commercial Managed Care - PPO | Admitting: Orthopedic Surgery

## 2022-05-03 ENCOUNTER — Other Ambulatory Visit (HOSPITAL_COMMUNITY): Payer: Self-pay

## 2022-05-03 ENCOUNTER — Encounter: Payer: Self-pay | Admitting: Orthopedic Surgery

## 2022-05-03 ENCOUNTER — Other Ambulatory Visit: Payer: Self-pay

## 2022-05-03 DIAGNOSIS — G8929 Other chronic pain: Secondary | ICD-10-CM | POA: Diagnosis not present

## 2022-05-03 DIAGNOSIS — M25512 Pain in left shoulder: Secondary | ICD-10-CM | POA: Diagnosis not present

## 2022-05-03 NOTE — Progress Notes (Signed)
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

## 2022-05-03 NOTE — Patient Instructions (Signed)
While we are working on your approval for MRI please go ahead and call to schedule your appointment with [facility] within at least one (1) week.   Central Scheduling [phone]  

## 2022-05-04 ENCOUNTER — Other Ambulatory Visit (HOSPITAL_COMMUNITY): Payer: Self-pay

## 2022-05-09 ENCOUNTER — Ambulatory Visit (HOSPITAL_COMMUNITY)
Admission: RE | Admit: 2022-05-09 | Discharge: 2022-05-09 | Disposition: A | Discharge status: Home / Self Care | Payer: Commercial Managed Care - PPO | Source: Ambulatory Visit | Attending: Orthopedic Surgery | Admitting: Orthopedic Surgery

## 2022-05-09 DIAGNOSIS — G8929 Other chronic pain: Secondary | ICD-10-CM | POA: Diagnosis not present

## 2022-05-09 DIAGNOSIS — M25512 Pain in left shoulder: Secondary | ICD-10-CM | POA: Insufficient documentation

## 2022-05-09 DIAGNOSIS — M25412 Effusion, left shoulder: Secondary | ICD-10-CM | POA: Diagnosis not present

## 2022-05-09 DIAGNOSIS — M19012 Primary osteoarthritis, left shoulder: Secondary | ICD-10-CM | POA: Diagnosis not present

## 2022-05-10 ENCOUNTER — Encounter: Payer: Self-pay | Admitting: Orthopedic Surgery

## 2022-05-14 ENCOUNTER — Encounter: Payer: Self-pay | Admitting: Orthopedic Surgery

## 2022-05-14 ENCOUNTER — Ambulatory Visit (INDEPENDENT_AMBULATORY_CARE_PROVIDER_SITE_OTHER): Payer: Commercial Managed Care - PPO | Admitting: Orthopedic Surgery

## 2022-05-14 ENCOUNTER — Ambulatory Visit: Payer: Commercial Managed Care - PPO

## 2022-05-14 DIAGNOSIS — M7582 Other shoulder lesions, left shoulder: Secondary | ICD-10-CM

## 2022-05-14 DIAGNOSIS — M25512 Pain in left shoulder: Secondary | ICD-10-CM | POA: Diagnosis not present

## 2022-05-14 MED ORDER — METHYLPREDNISOLONE ACETATE 40 MG/ML IJ SUSP
40.0000 mg | Freq: Once | INTRAMUSCULAR | Status: AC
  Administered 2022-05-14: 40 mg via INTRA_ARTICULAR

## 2022-05-14 NOTE — Patient Instructions (Addendum)
You have received an injection of steroids into the joint. 15% of patients will have increased pain within the 24 hours postinjection.   This is transient and will go away.   We recommend that you use ice packs on the injection site for 20 minutes every 2 hours and extra strength Tylenol 2 tablets every 8 as needed until the pain resolves.  If you continue to have pain after taking the Tylenol and using the ice please call the office for further instructions.   NOTE FOR WORK   Return to work with restrictions no heavy lifting

## 2022-05-14 NOTE — Progress Notes (Signed)
  MRI RESULTS FOLLOW UP   Encounter Diagnoses  Name Primary?   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 Mild anterior supraspinatus tendinosis with possible chronic calcific tendinosis in this region. Findings are similar to prior 01/12/2022 MRI. No rotator cuff tear. 2. Moderate degenerative changes of the acromioclavicular joint. 3. Mild-to-moderate glenohumeral cartilage degenerative changes.     Electronically Signed   By: Yvonne Kendall M.D.   On: 05/09/2022 17:39   Recommend subacromial injection and home exercise program to regain range of motion start [place] exercises continue anti-inflammatories and work restrictions to avoid heavy lifting  Communicated with Dr. Lynelle Doctor MyChart in 2 weeks  Procedure note the subacromial injection shoulder left   Verbal consent was obtained to inject the  Left   Shoulder  Timeout was completed to confirm the injection site is a subacromial space of the  left  shoulder  Medication used Depo-Medrol 40 mg and lidocaine 1% 3 cc  Anesthesia was provided by ethyl chloride  The injection was performed in the left  posterior subacromial space. After pinning the skin with alcohol and anesthetized the skin with ethyl chloride the subacromial space was injected using a 20-gauge needle. There were no complications  Sterile dressing was applied.

## 2022-05-17 ENCOUNTER — Ambulatory Visit: Payer: Commercial Managed Care - PPO | Admitting: Orthopedic Surgery

## 2022-05-21 ENCOUNTER — Ambulatory Visit: Payer: Commercial Managed Care - PPO | Admitting: Orthopedic Surgery

## 2022-05-22 ENCOUNTER — Other Ambulatory Visit (HOSPITAL_COMMUNITY): Payer: Self-pay

## 2022-05-24 ENCOUNTER — Encounter: Payer: Self-pay | Admitting: Orthopedic Surgery

## 2022-05-29 ENCOUNTER — Other Ambulatory Visit (HOSPITAL_COMMUNITY)
Admission: RE | Admit: 2022-05-29 | Discharge: 2022-05-29 | Disposition: A | Discharge status: Home / Self Care | Payer: Commercial Managed Care - PPO | Source: Ambulatory Visit | Attending: Adult Health | Admitting: Adult Health

## 2022-05-29 ENCOUNTER — Encounter: Payer: Self-pay | Admitting: Adult Health

## 2022-05-29 ENCOUNTER — Ambulatory Visit (INDEPENDENT_AMBULATORY_CARE_PROVIDER_SITE_OTHER): Payer: Commercial Managed Care - PPO | Admitting: Adult Health

## 2022-05-29 VITALS — BP 121/83 | HR 91 | Ht 62.0 in | Wt 294.5 lb

## 2022-05-29 DIAGNOSIS — Z01419 Encounter for gynecological examination (general) (routine) without abnormal findings: Secondary | ICD-10-CM | POA: Diagnosis not present

## 2022-05-29 DIAGNOSIS — Z1211 Encounter for screening for malignant neoplasm of colon: Secondary | ICD-10-CM | POA: Diagnosis not present

## 2022-05-29 DIAGNOSIS — Z975 Presence of (intrauterine) contraceptive device: Secondary | ICD-10-CM

## 2022-05-29 LAB — HEMOCCULT GUIAC POC 1CARD (OFFICE): Fecal Occult Blood, POC: NEGATIVE

## 2022-05-29 NOTE — Progress Notes (Signed)
Patient ID: [name], female   DOB: 11/29/1975, 47 y.o.   MRN: [id number] History of Present Illness: [name] is a 47 year old black female, married, G2P2 in for a well woman gyn exam and pap. She is still working at [employer].   PCP is Dr Moshe Cipro.   Current Medications, Allergies, Past Medical History, Past Surgical History, Family History and Social History were reviewed in [employer] record.     Review of Systems: Patient denies any headaches, hearing loss, fatigue, blurred vision, shortness of breath, chest pain, abdominal pain, problems with bowel movements, urination, or intercourse. No  mood swings.  Has pain in left shoulder and sees Dr Aline Brochure She has IUD and may have a period occasionally, about 5 months ago   Physical exam: BP 121/83 (BP Location: Right Arm, Patient Position: Sitting, Cuff Size: Normal)   Pulse 91   Ht '5\' 2"'$  (1.575 m)   Wt 294 lb 8 oz (133.6 kg)   BMI 53.86 kg/m   General:  Well developed, well nourished, no acute distress Skin:  Warm and dry Neck:  Midline trachea, normal thyroid, good ROM, no lymphadenopathy Lungs; Clear to auscultation bilaterally Breast:  No dominant palpable mass, retraction, or nipple discharge Cardiovascular: Regular rate and rhythm Abdomen:  Soft, non tender, no hepatosplenomegaly Pelvic:  External genitalia is normal in appearance, no lesions.  The vagina is normal in appearance. Urethra has no lesions or masses. The cervix is bulbous, pap with HR HPV genotyping performed, short IUD string at os.  Uterus is felt to be normal size, shape, and contour.  No adnexal masses or tenderness noted.Bladder is non tender, no masses felt. Rectal: Good sphincter tone, no polyps, or hemorrhoids felt.  Hemoccult negative. Extremities/musculoskeletal:  No swelling or varicosities noted, no clubbing or cyanosis Psych:  No mood changes, alert and cooperative,seems happy AA is 2 Fall risk is high    05/29/2022    10:52 AM 04/26/2022    4:22 PM 01/23/2022   10:20 AM  Depression screen PHQ 2/9  Decreased Interest 0 0 0  Down, Depressed, Hopeless 0 0 0  PHQ - 2 Score 0 0 0  Altered sleeping 0 2   Tired, decreased energy 1 2   Change in appetite 0 1   Feeling bad or failure about yourself  0 0   Trouble concentrating 0 0   Moving slowly or fidgety/restless 0 0   Suicidal thoughts 0 0   PHQ-9 Score 1 5   Difficult doing work/chores  Not difficult at all        05/29/2022   10:52 AM 04/26/2022    4:23 PM 09/19/2021   11:42 AM  GAD 7 : Generalized Anxiety Score  Nervous, Anxious, on Edge 0 1 1  Control/stop worrying 0 0 1  Worry too much - different things 0 0 3  Trouble relaxing 0 1 1  Restless 0 1 1  Easily annoyed or irritable 0 0 0  Afraid - awful might happen 0 0 0  Total GAD 7 Score 0 3 7  Anxiety Difficulty  Not difficult at all Somewhat difficult      Upstream - 05/29/22 1051       Pregnancy Intention Screening   Does the patient want to become pregnant in the next year? No    Does the patient's partner want to become pregnant in the next year? No    Would the patient like to discuss contraceptive options today?  No      Contraception Wrap Up   Current Method IUD or IUS    End Method IUD or IUS             Examination chaperoned by Levy Pupa LPN   Impression and Plan: 1. Encounter for gynecological examination with Papanicolaou smear of cervix Pap sent Pap in 3 years if normal Physical in 1 year with PCP Labs with PCP Mammogram was negative 04/06/22 Colonoscopy per GI  - Cytology - PAP( [facility])  2. Encounter for screening fecal occult blood testing Hemoccult was negative  - POCT occult blood stool  3. IUD (intrauterine device) in place Had liletta placed 10/07/2018

## 2022-05-31 ENCOUNTER — Other Ambulatory Visit (HOSPITAL_COMMUNITY): Payer: Self-pay

## 2022-05-31 ENCOUNTER — Other Ambulatory Visit: Payer: Self-pay

## 2022-06-04 LAB — CYTOLOGY - PAP
Adequacy: ABSENT
Comment: NEGATIVE
Diagnosis: NEGATIVE
High risk HPV: NEGATIVE

## 2022-06-20 ENCOUNTER — Other Ambulatory Visit (HOSPITAL_COMMUNITY): Payer: Self-pay

## 2022-06-21 ENCOUNTER — Encounter: Payer: Self-pay | Admitting: Radiology

## 2022-06-27 ENCOUNTER — Other Ambulatory Visit (HOSPITAL_COMMUNITY): Payer: Self-pay

## 2022-07-09 ENCOUNTER — Other Ambulatory Visit (HOSPITAL_COMMUNITY): Payer: Self-pay

## 2022-07-26 ENCOUNTER — Ambulatory Visit: Payer: Commercial Managed Care - PPO | Admitting: Family Medicine

## 2022-08-03 ENCOUNTER — Other Ambulatory Visit: Payer: Self-pay

## 2022-08-03 ENCOUNTER — Other Ambulatory Visit: Payer: Self-pay | Admitting: Family Medicine

## 2022-08-03 ENCOUNTER — Other Ambulatory Visit (HOSPITAL_COMMUNITY): Payer: Self-pay

## 2022-08-03 ENCOUNTER — Encounter: Payer: Self-pay | Admitting: Family Medicine

## 2022-08-03 MED ORDER — TIRZEPATIDE 12.5 MG/0.5ML ~~LOC~~ SOAJ
12.5000 mg | SUBCUTANEOUS | 1 refills | Status: DC
  Filled 2022-08-03: qty 1, 14d supply, fill #0
  Filled 2022-08-03 – 2022-08-06 (×2): qty 2, 28d supply, fill #0
  Filled 2022-08-29: qty 2, 28d supply, fill #1
  Filled 2022-09-23: qty 2, 28d supply, fill #2
  Filled 2022-10-21: qty 2, 28d supply, fill #3
  Filled 2022-11-17: qty 2, 28d supply, fill #4
  Filled 2022-12-16: qty 2, 28d supply, fill #5

## 2022-08-03 MED ORDER — PANTOPRAZOLE SODIUM 40 MG PO TBEC
40.0000 mg | DELAYED_RELEASE_TABLET | Freq: Every day | ORAL | 3 refills | Status: DC
  Filled 2022-08-03: qty 90, 90d supply, fill #0
  Filled 2022-11-17: qty 90, 90d supply, fill #1
  Filled 2023-02-13: qty 90, 90d supply, fill #2
  Filled 2023-05-27 (×2): qty 90, 90d supply, fill #3

## 2022-08-03 NOTE — Telephone Encounter (Signed)
Patient called spoke to pharmacy can not get the 10 mg , but has the 12.5 mg available, can send in the request, Can the pharmacy change this prescription to 12.5 mg. Patient call back # [phone]. patient uses [facility].

## 2022-08-06 ENCOUNTER — Other Ambulatory Visit (HOSPITAL_COMMUNITY): Payer: Self-pay

## 2022-08-06 ENCOUNTER — Other Ambulatory Visit: Payer: Self-pay

## 2022-08-06 DIAGNOSIS — H524 Presbyopia: Secondary | ICD-10-CM | POA: Diagnosis not present

## 2022-08-07 LAB — HM DIABETES EYE EXAM

## 2022-08-16 ENCOUNTER — Other Ambulatory Visit: Payer: Self-pay | Admitting: Family Medicine

## 2022-08-16 DIAGNOSIS — E785 Hyperlipidemia, unspecified: Secondary | ICD-10-CM | POA: Diagnosis not present

## 2022-08-17 ENCOUNTER — Ambulatory Visit: Payer: Commercial Managed Care - PPO | Admitting: Family Medicine

## 2022-08-17 ENCOUNTER — Encounter: Payer: Self-pay | Admitting: Family Medicine

## 2022-08-17 VITALS — BP 112/69 | HR 69 | Ht 62.0 in | Wt 305.1 lb

## 2022-08-17 DIAGNOSIS — E559 Vitamin D deficiency, unspecified: Secondary | ICD-10-CM | POA: Diagnosis not present

## 2022-08-17 DIAGNOSIS — M25519 Pain in unspecified shoulder: Secondary | ICD-10-CM | POA: Diagnosis not present

## 2022-08-17 DIAGNOSIS — G8929 Other chronic pain: Secondary | ICD-10-CM | POA: Diagnosis not present

## 2022-08-17 DIAGNOSIS — E785 Hyperlipidemia, unspecified: Secondary | ICD-10-CM

## 2022-08-17 DIAGNOSIS — E669 Obesity, unspecified: Secondary | ICD-10-CM | POA: Diagnosis not present

## 2022-08-17 DIAGNOSIS — D122 Benign neoplasm of ascending colon: Secondary | ICD-10-CM

## 2022-08-17 DIAGNOSIS — E1169 Type 2 diabetes mellitus with other specified complication: Secondary | ICD-10-CM

## 2022-08-17 LAB — CMP14+EGFR
ALT: 10 IU/L (ref 0–32)
AST: 15 IU/L (ref 0–40)
Albumin/Globulin Ratio: 1.5 (ref 1.2–2.2)
Albumin: 4.1 g/dL (ref 3.9–4.9)
Alkaline Phosphatase: 87 IU/L (ref 44–121)
BUN/Creatinine Ratio: 21 (ref 9–23)
BUN: 16 mg/dL (ref 6–24)
Bilirubin Total: 0.4 mg/dL (ref 0.0–1.2)
CO2: 21 mmol/L (ref 20–29)
Calcium: 9 mg/dL (ref 8.7–10.2)
Chloride: 106 mmol/L (ref 96–106)
Creatinine, Ser: 0.75 mg/dL (ref 0.57–1.00)
Globulin, Total: 2.7 g/dL (ref 1.5–4.5)
Glucose: 110 mg/dL — ABNORMAL HIGH (ref 70–99)
Potassium: 4.2 mmol/L (ref 3.5–5.2)
Sodium: 141 mmol/L (ref 134–144)
Total Protein: 6.8 g/dL (ref 6.0–8.5)
eGFR: 98 mL/min/{1.73_m2} (ref >59)

## 2022-08-17 LAB — LIPID PANEL
Chol/HDL Ratio: 2.8 ratio (ref 0.0–4.4)
Cholesterol, Total: 168 mg/dL (ref 100–199)
HDL: 59 mg/dL (ref >39)
LDL Chol Calc (NIH): 99 mg/dL (ref 0–99)
Triglycerides: 49 mg/dL (ref 0–149)
VLDL Cholesterol Cal: 10 mg/dL (ref 5–40)

## 2022-08-17 NOTE — Assessment & Plan Note (Signed)
Fit ordered

## 2022-08-17 NOTE — Patient Instructions (Addendum)
F/u in 13 weeks, call if you need me sooner  Weight loss goal of 12 pounds  It is important that you exercise regularly at least 30 minutes 5 times a week. If you develop chest pain, have severe difficulty breathing, or feel very tired, stop exercising immediately and seek medical attention    hBA1C, urine aCR, cBC and Vit d 3 to 5 days before visit  Change eating habit/ food xchoice  Cologuard to be sent off   Thanks for choosing [facility], we consider it a privelige to serve you.

## 2022-08-17 NOTE — Assessment & Plan Note (Signed)
Updated lab needed at/ before next visit.   

## 2022-08-17 NOTE — Assessment & Plan Note (Signed)
Updated lab needed at/ before next visit. [name] is reminded of the importance of commitment to daily physical activity for 30 minutes or more, as able and the need to limit carbohydrate intake to 30 to 60 grams per meal to help with blood sugar control.   The need to take medication as prescribed, test blood sugar as directed, and to call between visits if there is a concern that blood sugar is uncontrolled is also discussed.   [name] is reminded of the importance of daily foot exam, annual eye examination, and good blood sugar, blood pressure and cholesterol control.     Latest Ref Rng & Units 08/16/2022   11:17 AM 04/26/2022    4:50 PM 01/23/2022   10:52 AM 09/15/2021    1:13 PM 05/15/2021   11:56 AM  Diabetic Labs  HbA1c 4.8 - 5.6 %  6.1  6.2  6.0  6.0   Micro/Creat Ratio 0 - 29 mg/g creat    5    Chol 100 - 199 mg/dL 161   096  045    HDL >40 mg/dL 59   62  59    Calc LDL 0 - 99 mg/dL 99   981  75    Triglycerides 0 - 149 mg/dL 49   54  51    Creatinine 0.57 - 1.00 mg/dL 1.91  4.78  2.95  6.21  0.60       08/17/2022   10:09 AM 05/29/2022   11:03 AM 05/29/2022   10:46 AM 04/26/2022    4:21 PM 01/25/2022    3:01 PM 01/23/2022   10:17 AM 12/07/2021    3:18 PM  BP/Weight  Systolic BP 112 121 145 104  112 129  Diastolic BP 69 83 86 69  72 84  Wt. (Lbs) 305.08  294.5 299.04 302 302 306  BMI 55.8 kg/m2  53.86 kg/m2 53.82 kg/m2 54.36 kg/m2 54.36 kg/m2 55.08 kg/m2      Latest Ref Rng & Units 01/23/2022   10:00 AM 02/09/2021   12:00 AM  Foot/eye exam completion dates  Eye Exam No Retinopathy  No Retinopathy      Foot Form Completion  Done      This result is from an external source.

## 2022-08-17 NOTE — Assessment & Plan Note (Signed)
Deteriorated  Patient re-educated about  the importance of commitment to a  minimum of 150 minutes of exercise per week as able.  The importance of healthy food choices with portion control discussed, as well as eating regularly and within a 12 hour window most days. The need to choose "clean , green" food 50 to 75% of the time is discussed, as well as to make water the primary drink and set a goal of 64 ounces water daily.       08/17/2022   10:09 AM 05/29/2022   10:46 AM 04/26/2022    4:21 PM  Weight /BMI  Weight 305 lb 1.3 oz 294 lb 8 oz 299 lb 0.6 oz  Height 5\' 2"  (1.575 m) 5\' 2"  (1.575 m) 5' 2.5" (1.588 m)  BMI 55.8 kg/m2 53.86 kg/m2 53.82 kg/m2

## 2022-08-17 NOTE — Assessment & Plan Note (Signed)
Hyperlipidemia:Low fat diet discussed and encouraged.   Lipid Panel  Lab Results  Component Value Date   CHOL 168 08/16/2022   HDL 59 08/16/2022   LDLCALC 99 08/16/2022   TRIG 49 08/16/2022   CHOLHDL 2.8 08/16/2022     Controlled, no change in medication

## 2022-08-17 NOTE — Progress Notes (Signed)
[name]     MRN: [id number]      DOB: July 18, 1975  Chief Complaint  Patient presents with   Follow-up    Shoulder pain, discuss upcoming flight    HPI [name] is here for follow up and re-evaluation of chronic medical conditions, medication management and review of any available recent lab and radiology data.  Preventive health is updated, specifically  Cancer screening and Immunization.   Questions or concerns regarding consultations or procedures which the PT has had in the interim are  addressed. The PT denies any adverse reactions to current medications since the last visit.  ROS Denies recent fever or chills. Denies sinus pressure, nasal congestion, ear pain or sore throat. Denies chest congestion, productive cough or wheezing. Denies chest pains, palpitations and leg swelling Denies abdominal pain, nausea, vomiting,diarrhea or constipation.   Denies dysuria, frequency, hesitancy or incontinence. Denies headaches, seizures, numbness, or tingling. Denies depression, anxiety or insomnia. Denies skin break down or rash.   PE  BP 112/69 (BP Location: Right Arm, Patient Position: Sitting, Cuff Size: Normal)   Pulse 69   Ht 5\' 2"  (1.575 m)   Wt (!) 305 lb 1.3 oz (138.4 kg)   SpO2 98%   BMI 55.80 kg/m   Patient alert and oriented and in no cardiopulmonary distress.  HEENT: No facial asymmetry, EOMI,     Neck supple .  Chest: Clear to auscultation bilaterally.  CVS: S1, S2 no murmurs, no S3.Regular rate.  ABD: Soft non tender.   Ext: No edema  MS: Adequate ROM spine, shoulders, hips and knees.  Skin: Intact, no ulcerations or rash noted.  Psych: Good eye contact, normal affect. Memory intact not anxious or depressed appearing.  CNS: CN 2-12 intact, power,  normal throughout.no focal deficits noted.   Assessment & Plan  Hyperlipidemia LDL goal <100 Hyperlipidemia:Low fat diet discussed and encouraged.   Lipid Panel  Lab Results  Component Value  Date   CHOL 168 08/16/2022   HDL 59 08/16/2022   LDLCALC 99 08/16/2022   TRIG 49 08/16/2022   CHOLHDL 2.8 08/16/2022     Controlled, no change in medication   Morbid obesity ([facility]) Deteriorated  Patient re-educated about  the importance of commitment to a  minimum of 150 minutes of exercise per week as able.  The importance of healthy food choices with portion control discussed, as well as eating regularly and within a 12 hour window most days. The need to choose "clean , green" food 50 to 75% of the time is discussed, as well as to make water the primary drink and set a goal of 64 ounces water daily.       08/17/2022   10:09 AM 05/29/2022   10:46 AM 04/26/2022    4:21 PM  Weight /BMI  Weight 305 lb 1.3 oz 294 lb 8 oz 299 lb 0.6 oz  Height 5\' 2"  (1.575 m) 5\' 2"  (1.575 m) 5' 2.5" (1.588 m)  BMI 55.8 kg/m2 53.86 kg/m2 53.82 kg/m2      Shoulder pain Unchanged despite therapy, will continue per Ortho direction, surgery not indicated  Type 2 diabetes mellitus with obesity ([facility]) Updated lab needed at/ before next visit. [name] is reminded of the importance of commitment to daily physical activity for 30 minutes or more, as able and the need to limit carbohydrate intake to 30 to 60 grams per meal to help with blood sugar control.   The need to take medication as prescribed,  test blood sugar as directed, and to call between visits if there is a concern that blood sugar is uncontrolled is also discussed.   [name] is reminded of the importance of daily foot exam, annual eye examination, and good blood sugar, blood pressure and cholesterol control.     Latest Ref Rng & Units 08/16/2022   11:17 AM 04/26/2022    4:50 PM 01/23/2022   10:52 AM 09/15/2021    1:13 PM 05/15/2021   11:56 AM  Diabetic Labs  HbA1c 4.8 - 5.6 %  6.1  6.2  6.0  6.0   Micro/Creat Ratio 0 - 29 mg/g creat    5    Chol 100 - 199 mg/dL 409   811  914    HDL >78 mg/dL 59   62  59    Calc LDL 0 - 99 mg/dL 99    295  75    Triglycerides 0 - 149 mg/dL 49   54  51    Creatinine 0.57 - 1.00 mg/dL 6.21  3.08  6.57  8.46  0.60       08/17/2022   10:09 AM 05/29/2022   11:03 AM 05/29/2022   10:46 AM 04/26/2022    4:21 PM 01/25/2022    3:01 PM 01/23/2022   10:17 AM 12/07/2021    3:18 PM  BP/Weight  Systolic BP 112 121 145 104  112 129  Diastolic BP 69 83 86 69  72 84  Wt. (Lbs) 305.08  294.5 299.04 302 302 306  BMI 55.8 kg/m2  53.86 kg/m2 53.82 kg/m2 54.36 kg/m2 54.36 kg/m2 55.08 kg/m2      Latest Ref Rng & Units 01/23/2022   10:00 AM 02/09/2021   12:00 AM  Foot/eye exam completion dates  Eye Exam No Retinopathy  No Retinopathy      Foot Form Completion  Done      This result is from an external source.        Vitamin D deficiency Updated lab needed at/ before next visit.   Adenomatous polyp of ascending colon Fit ordered

## 2022-08-17 NOTE — Assessment & Plan Note (Signed)
Unchanged despite therapy, will continue per Ortho direction, surgery not indicated

## 2022-08-20 ENCOUNTER — Other Ambulatory Visit: Payer: Self-pay | Admitting: Family Medicine

## 2022-08-29 ENCOUNTER — Other Ambulatory Visit (HOSPITAL_COMMUNITY): Payer: Self-pay

## 2022-09-18 ENCOUNTER — Other Ambulatory Visit (HOSPITAL_COMMUNITY): Payer: Self-pay

## 2022-09-24 ENCOUNTER — Other Ambulatory Visit (HOSPITAL_COMMUNITY): Payer: Self-pay

## 2022-09-27 ENCOUNTER — Other Ambulatory Visit (HOSPITAL_COMMUNITY): Payer: Self-pay

## 2022-10-22 ENCOUNTER — Other Ambulatory Visit: Payer: Self-pay

## 2022-11-05 ENCOUNTER — Encounter: Payer: Self-pay | Admitting: Orthopedic Surgery

## 2022-11-07 ENCOUNTER — Ambulatory Visit: Payer: Commercial Managed Care - PPO | Admitting: Orthopedic Surgery

## 2022-11-07 ENCOUNTER — Other Ambulatory Visit (INDEPENDENT_AMBULATORY_CARE_PROVIDER_SITE_OTHER): Payer: Commercial Managed Care - PPO

## 2022-11-07 VITALS — BP 144/85 | HR 80 | Ht 62.5 in | Wt 316.0 lb

## 2022-11-07 DIAGNOSIS — M79672 Pain in left foot: Secondary | ICD-10-CM

## 2022-11-07 DIAGNOSIS — G5762 Lesion of plantar nerve, left lower limb: Secondary | ICD-10-CM

## 2022-11-07 MED ORDER — METHYLPREDNISOLONE ACETATE 40 MG/ML IJ SUSP
40.0000 mg | Freq: Once | INTRAMUSCULAR | Status: AC
Start: 2022-11-07 — End: 2022-11-07
  Administered 2022-11-07: 40 mg

## 2022-11-07 NOTE — Progress Notes (Signed)
Chief Complaint  Patient presents with   Foot Pain    Feels like its a flare up of plantar fasciitis Started Friday pain and Pinky toe down to bottom of foot feels like its asleep Has been icing, doing stretches and taking ibuprofen with some relief   Patient: [name]           Date of Birth: [birth year]           MRN: [id number] Visit Date: 11/07/2022 Requested by: Kerri Perches, MD [id number], [address] [place],  [place] [address] PCP: Kerri Perches, MD    Chief Complaint  Patient presents with   Foot Pain    Feels like its a flare up of plantar fasciitis Started Friday pain and Pinky toe down to bottom of foot feels like its asleep Has been icing, doing stretches and taking ibuprofen with some relief    47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  The patient meets the AMA guidelines for Morbid (severe) obesity with a BMI > 40.0 and I have recommended weight loss.   Problem list, medical hx, medications and allergies reviewed   ROS   No Known Allergies  BP (!) 144/85   Pulse 80   Ht 5' 2.5" (1.588 m)   Wt (!) 316 lb (143.3 kg)   BMI 56.88 kg/m    Physical exam: Physical Exam Vitals and nursing note reviewed.  Constitutional:      Appearance: Normal appearance.  HENT:     Head: Normocephalic and atraumatic.  Eyes:     General: No scleral icterus.       Right eye: No discharge.        Left eye: No discharge.     Extraocular Movements: Extraocular movements intact.     Conjunctiva/sclera: Conjunctivae normal.     Pupils: Pupils are equal, round, and reactive to light.  Cardiovascular:     Rate and Rhythm: Normal rate.     Pulses: Normal pulses.          Dorsalis pedis pulses are 2+ on the left side.   Musculoskeletal:     Left foot: Normal range of motion. No deformity, bunion, Charcot foot, foot drop or prominent metatarsal heads.  Feet:     Left foot:     Skin integrity: Skin integrity normal. No ulcer, blister, skin breakdown, erythema or warmth.     Comments: Tenderness is noted over the plantar aspect of the fourth and fifth digits.  There is some pain with compression of the interdigital nerve as well as the positive squeeze test.  Signs and symptoms consistent with possible neuroma   Skin:    General: Skin is warm and dry.     Capillary Refill: Capillary refill takes less than 2 seconds.  Neurological:     General: No focal deficit present.     Mental Status: She is alert and oriented to person, place, and time.  Psychiatric:        Mood and Affect: Mood normal.        Behavior: Behavior normal.        Thought Content: Thought content normal.        Judgment: Judgment normal.  Data reviewed:   Xrays: neg   Assessment and plan:  Encounter Diagnoses  Name Primary?   Pain in left foot Yes   Morton's neuroma of left foot    Assessment and plan  46 year old female with acute onset of pain and numbness and tingling in the full feeling around the fourth and fifth digit of the left foot  Differential diagnosis includes stress fracture, neuroma, synovitis.  Most likely diagnosis is Morton's neuroma  Discussed possible treatment options include but not limited to wider shoes, anti-inflammatories, gabapentin, metatarsal bar, cortisone injection.  The patient opted for full-court press  She will wear the wider shoes, get the metatarsal pads, get the cortisone injection  Follow-up in 6 weeks  Meds ordered this encounter  Medications   methylPREDNISolone acetate (DEPO-MEDROL) injection 40 mg    Procedures:   Injection Morton's neuroma  Medication Depo-Medrol 40, lidocaine 1% 2 cc  Patient gave consent site was confirmed.  The area between the fourth and  fifth digit was cleaned ethyl chloride was used for anesthesia.  The injection was performed in between the toes and between the metatarsal heads.  Successful injection was confirmed  Patient had no complaints  Patient released

## 2022-11-17 ENCOUNTER — Other Ambulatory Visit: Payer: Self-pay | Admitting: Family Medicine

## 2022-11-17 ENCOUNTER — Other Ambulatory Visit (HOSPITAL_COMMUNITY): Payer: Self-pay

## 2022-11-19 ENCOUNTER — Other Ambulatory Visit: Payer: Self-pay

## 2022-11-19 ENCOUNTER — Other Ambulatory Visit (HOSPITAL_COMMUNITY): Payer: Self-pay

## 2022-11-19 MED ORDER — ERGOCALCIFEROL 1.25 MG (50000 UT) PO CAPS
50000.0000 [IU] | ORAL_CAPSULE | ORAL | 2 refills | Status: DC
  Filled 2022-11-19: qty 12, 84d supply, fill #0
  Filled 2023-01-16 – 2023-02-13 (×2): qty 12, 84d supply, fill #1
  Filled 2023-07-26 – 2023-08-02 (×3): qty 12, 84d supply, fill #2

## 2022-11-27 ENCOUNTER — Ambulatory Visit: Payer: Commercial Managed Care - PPO | Admitting: Family Medicine

## 2022-12-05 DIAGNOSIS — E559 Vitamin D deficiency, unspecified: Secondary | ICD-10-CM | POA: Diagnosis not present

## 2022-12-16 ENCOUNTER — Other Ambulatory Visit (HOSPITAL_COMMUNITY): Payer: Self-pay

## 2022-12-19 ENCOUNTER — Other Ambulatory Visit (HOSPITAL_COMMUNITY): Payer: Self-pay

## 2022-12-19 ENCOUNTER — Ambulatory Visit: Payer: Commercial Managed Care - PPO | Admitting: Family Medicine

## 2022-12-19 ENCOUNTER — Encounter: Payer: Self-pay | Admitting: Family Medicine

## 2022-12-19 VITALS — BP 104/67 | HR 73 | Ht 62.0 in | Wt 312.1 lb

## 2022-12-19 DIAGNOSIS — E669 Obesity, unspecified: Secondary | ICD-10-CM | POA: Diagnosis not present

## 2022-12-19 DIAGNOSIS — J309 Allergic rhinitis, unspecified: Secondary | ICD-10-CM | POA: Diagnosis not present

## 2022-12-19 DIAGNOSIS — E559 Vitamin D deficiency, unspecified: Secondary | ICD-10-CM | POA: Diagnosis not present

## 2022-12-19 DIAGNOSIS — K219 Gastro-esophageal reflux disease without esophagitis: Secondary | ICD-10-CM | POA: Diagnosis not present

## 2022-12-19 DIAGNOSIS — R300 Dysuria: Secondary | ICD-10-CM

## 2022-12-19 DIAGNOSIS — E1169 Type 2 diabetes mellitus with other specified complication: Secondary | ICD-10-CM | POA: Diagnosis not present

## 2022-12-19 DIAGNOSIS — Z7985 Long-term (current) use of injectable non-insulin antidiabetic drugs: Secondary | ICD-10-CM

## 2022-12-19 DIAGNOSIS — Z1231 Encounter for screening mammogram for malignant neoplasm of breast: Secondary | ICD-10-CM | POA: Diagnosis not present

## 2022-12-19 DIAGNOSIS — E785 Hyperlipidemia, unspecified: Secondary | ICD-10-CM | POA: Diagnosis not present

## 2022-12-19 DIAGNOSIS — F411 Generalized anxiety disorder: Secondary | ICD-10-CM

## 2022-12-19 MED ORDER — TIRZEPATIDE 15 MG/0.5ML ~~LOC~~ SOAJ
15.0000 mg | SUBCUTANEOUS | 5 refills | Status: DC
  Filled 2022-12-19 – 2023-01-16 (×2): qty 2, 28d supply, fill #0
  Filled 2023-02-13: qty 2, 28d supply, fill #1
  Filled 2023-03-11: qty 2, 28d supply, fill #2

## 2022-12-19 NOTE — Patient Instructions (Addendum)
F./U in mid December, call if you need me sooner  Dose increase in mounjaro to 15 mg weekly  I absolutely encourage you to get counseling , to improve, use EAP   Hbaic, cmp and eGFr and lipids 3 to 5 days before December appt  Pls schedule mammogram at checkout ( nurse pls order)   Start OTC vit  D3, 2000 international units daily, and continue weekly  Thanks for choosing [facility], we consider it a privelige to serve you.

## 2022-12-20 ENCOUNTER — Encounter: Payer: Self-pay | Admitting: Orthopedic Surgery

## 2022-12-20 ENCOUNTER — Ambulatory Visit: Payer: Commercial Managed Care - PPO | Admitting: Orthopedic Surgery

## 2022-12-20 ENCOUNTER — Other Ambulatory Visit (HOSPITAL_COMMUNITY): Payer: Self-pay

## 2022-12-20 ENCOUNTER — Other Ambulatory Visit: Payer: Self-pay

## 2022-12-20 ENCOUNTER — Encounter: Payer: Self-pay | Admitting: Family Medicine

## 2022-12-20 VITALS — BP 110/66 | HR 85 | Ht 62.0 in | Wt 311.0 lb

## 2022-12-20 DIAGNOSIS — M79672 Pain in left foot: Secondary | ICD-10-CM

## 2022-12-20 DIAGNOSIS — G5762 Lesion of plantar nerve, left lower limb: Secondary | ICD-10-CM

## 2022-12-20 MED ORDER — GABAPENTIN 100 MG PO CAPS
100.0000 mg | ORAL_CAPSULE | Freq: Every day | ORAL | 2 refills | Status: DC
  Filled 2022-12-20 (×2): qty 30, 30d supply, fill #0
  Filled 2023-01-16: qty 30, 30d supply, fill #1
  Filled 2023-02-13: qty 30, 30d supply, fill #2

## 2022-12-20 NOTE — Assessment & Plan Note (Signed)
[name] is reminded of the importance of commitment to daily physical activity for 30 minutes or more, as able and the need to limit carbohydrate intake to 30 to 60 grams per meal to help with blood sugar control.   The need to take medication as prescribed, test blood sugar as directed, and to call between visits if there is a concern that blood sugar is uncontrolled is also discussed.   [name] is reminded of the importance of daily foot exam, annual eye examination, and good blood sugar, blood pressure and cholesterol control.     Latest Ref Rng & Units 12/05/2022   11:01 AM 08/16/2022   11:17 AM 04/26/2022    4:50 PM 01/23/2022   10:52 AM 09/15/2021    1:13 PM  Diabetic Labs  HbA1c 4.8 - 5.6 % 6.8   6.1  6.2  6.0   Micro/Creat Ratio 0 - 29 mg/g creat 13     5   Chol 100 - 199 mg/dL  161   096  045   HDL >40 mg/dL  59   62  59   Calc LDL 0 - 99 mg/dL  99   981  75   Triglycerides 0 - 149 mg/dL  49   54  51   Creatinine 0.57 - 1.00 mg/dL  1.91  4.78  2.95  6.21       12/19/2022   10:31 AM 11/07/2022    3:58 PM 08/17/2022   10:09 AM 05/29/2022   11:03 AM 05/29/2022   10:46 AM 04/26/2022    4:21 PM 01/25/2022    3:01 PM  BP/Weight  Systolic BP 104 144 112 121 145 104   Diastolic BP 67 85 69 83 86 69   Wt. (Lbs) 312.12 316 305.08  294.5 299.04 302  BMI 57.09 kg/m2 56.88 kg/m2 55.8 kg/m2  53.86 kg/m2 53.82 kg/m2 54.36 kg/m2      Latest Ref Rng & Units 08/07/2022   12:00 AM 01/23/2022   10:00 AM  Foot/eye exam completion dates  Eye Exam No Retinopathy No Retinopathy       Foot Form Completion   Done     This result is from an external source.      Inc mounjaro dose

## 2022-12-20 NOTE — Progress Notes (Signed)
[name]     MRN: [id number]      DOB: 1975-05-18  Chief Complaint  Patient presents with   Follow-up    Follow up foot injury seeing dr Romeo Apple for follow up tomorrow     HPI [name] is here for follow up and re-evaluation of chronic medical conditions, medication management and review of any available recent lab and radiology data.  Preventive health is updated, specifically  Cancer screening and Immunization.   Questions or concerns regarding consultations or procedures which the PT has had in the interim are  addressed.Still having significant pain in feet, being managed by Ortho The PT denies any adverse reactions to current medications since the last visit.  C/o increased stress in past several months, leading to stres eating, thinks that counseling may be beneficial and is thinking about it, not suicidal or homicidal Denies polyuria, polydipsia, blurred vision , or hypoglycemic episodes.   ROS Denies recent fever or chills. Denies sinus pressure, nasal congestion, ear pain or sore throat. Denies chest congestion, productive cough or wheezing. Denies chest pains, palpitations and leg swelling Denies abdominal pain, nausea, vomiting,diarrhea or constipation.   Denies dysuria, frequency, hesitancy or incontinence. Denies headaches, seizures, numbness, or tingling. Denies skin break down or rash.   PE  BP 104/67 (BP Location: Right Arm, Patient Position: Sitting, Cuff Size: Large)   Pulse 73   Ht 5\' 2"  (1.575 m)   Wt (!) 312 lb 1.9 oz (141.6 kg)   SpO2 98%   BMI 57.09 kg/m   Patient alert and oriented and in no cardiopulmonary distress.  HEENT: No facial asymmetry, EOMI,     Neck supple .  Chest: Clear to auscultation bilaterally.  CVS: S1, S2 no murmurs, no S3.Regular rate.  ABD: Soft non tender.   Ext: No edema  MS: Decreased  ROM spine, shoulders, hips and knees.  Skin: Intact, no ulcerations or rash noted.  Psych: Good eye contact, normal  affect. Memory intact not anxious or depressed appearing.  CNS: CN 2-12 intact, power,  normal throughout.no focal deficits noted.   Assessment & Plan  Morbid obesity [facility])  Patient re-educated about  the importance of commitment to a  minimum of 150 minutes of exercise per week as able.  The importance of healthy food choices with portion control discussed, as well as eating regularly and within a 12 hour window most days. The need to choose "clean , green" food 50 to 75% of the time is discussed, as well as to make water the primary drink and set a goal of 64 ounces water daily.       12/19/2022   10:31 AM 11/07/2022    3:58 PM 08/17/2022   10:09 AM  Weight /BMI  Weight 312 lb 1.9 oz 316 lb 305 lb 1.3 oz  Height 5\' 2"  (1.575 m) 5' 2.5" (1.588 m) 5\' 2"  (1.575 m)  BMI 57.09 kg/m2 56.88 kg/m2 55.8 kg/m2    Increase dose mounjaro  Type 2 diabetes mellitus with obesity (HCC) [name] is reminded of the importance of commitment to daily physical activity for 30 minutes or more, as able and the need to limit carbohydrate intake to 30 to 60 grams per meal to help with blood sugar control.   The need to take medication as prescribed, test blood sugar as directed, and to call between visits if there is a concern that blood sugar is uncontrolled is also discussed.   [name] is reminded of  the importance of daily foot exam, annual eye examination, and good blood sugar, blood pressure and cholesterol control.     Latest Ref Rng & Units 12/05/2022   11:01 AM 08/16/2022   11:17 AM 04/26/2022    4:50 PM 01/23/2022   10:52 AM 09/15/2021    1:13 PM  Diabetic Labs  HbA1c 4.8 - 5.6 % 6.8   6.1  6.2  6.0   Micro/Creat Ratio 0 - 29 mg/g creat 13     5   Chol 100 - 199 mg/dL  638   756  433   HDL >29 mg/dL  59   62  59   Calc LDL 0 - 99 mg/dL  99   518  75   Triglycerides 0 - 149 mg/dL  49   54  51   Creatinine 0.57 - 1.00 mg/dL  8.41  6.60  6.30  1.60       12/19/2022   10:31 AM 11/07/2022     3:58 PM 08/17/2022   10:09 AM 05/29/2022   11:03 AM 05/29/2022   10:46 AM 04/26/2022    4:21 PM 01/25/2022    3:01 PM  BP/Weight  Systolic BP 104 144 112 121 145 104   Diastolic BP 67 85 69 83 86 69   Wt. (Lbs) 312.12 316 305.08  294.5 299.04 302  BMI 57.09 kg/m2 56.88 kg/m2 55.8 kg/m2  53.86 kg/m2 53.82 kg/m2 54.36 kg/m2      Latest Ref Rng & Units 08/07/2022   12:00 AM 01/23/2022   10:00 AM  Foot/eye exam completion dates  Eye Exam No Retinopathy No Retinopathy       Foot Form Completion   Done     This result is from an external source.      Inc mounjaro dose  Vitamin D deficiency Uncorrected start daily vit D3, 2000 units and continue weekly  Allergic rhinitis Controlled, no change in medication   Hyperlipidemia LDL goal <100 Hyperlipidemia:Low fat diet discussed and encouraged.   Lipid Panel  Lab Results  Component Value Date   CHOL 168 08/16/2022   HDL 59 08/16/2022   LDLCALC 99 08/16/2022   TRIG 49 08/16/2022   CHOLHDL 2.8 08/16/2022     Updated lab needed at/ before next visit.   GERD Controlled, no change in medication   GAD (generalized anxiety disorder) Increased stress and anxiety in past 3 to 4 months reported, encouraged strongly to seek therapy and intends to do so

## 2022-12-20 NOTE — Progress Notes (Signed)
   VISIT TYPE: FOLLOW UP   Chief Complaint  Patient presents with   Foot Pain    Follow up left foot its better but still numb    Encounter Diagnoses  Name Primary?   Pain in left foot    Morton's neuroma of left foot Yes    Assessment and Plan: Difficult diagnostic conclusion, I think it is an overall and may be some metatarsalgia  Recommend gabapentin versus excision of neuroma follow-up in 6 weeks.   Prior treatment:   Differential diagnosis includes stress fracture, neuroma, synovitis.   Most likely diagnosis is Morton's neuroma   Discussed possible treatment options include but not limited to wider shoes, anti-inflammatories, gabapentin, metatarsal bar, cortisone injection.   The patient opted for full-court press   She will wear the wider shoes, get the metatarsal pads, get the cortisone injection   HPI:   Essentially has done well with changing her shoewear to wider shoes and she did get improvement overall but she still having the numbness in the lateral portion of the small toe and posterior inferior portion as well.    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   A/P Encounter Diagnoses  Name Primary?   Pain in left foot    Morton's neuroma of left foot Yes    Meds ordered this encounter  Medications   gabapentin (NEURONTIN) 100 MG capsule    Sig: Take 1 capsule (100 mg  total) by mouth at bedtime.    Dispense:  30 capsule    Refill:  2

## 2022-12-20 NOTE — Assessment & Plan Note (Signed)
Hyperlipidemia:Low fat diet discussed and encouraged.   Lipid Panel  Lab Results  Component Value Date   CHOL 168 08/16/2022   HDL 59 08/16/2022   LDLCALC 99 08/16/2022   TRIG 49 08/16/2022   CHOLHDL 2.8 08/16/2022     Updated lab needed at/ before next visit.

## 2022-12-20 NOTE — Assessment & Plan Note (Signed)
Controlled, no change in medication  

## 2022-12-20 NOTE — Assessment & Plan Note (Signed)
Uncorrected start daily vit D3, 2000 units and continue weekly

## 2022-12-20 NOTE — Assessment & Plan Note (Signed)
Increased stress and anxiety in past 3 to 4 months reported, encouraged strongly to seek therapy and intends to do so

## 2022-12-20 NOTE — Patient Instructions (Signed)
Husband to massage foot daily   Start Gabapentin

## 2022-12-20 NOTE — Assessment & Plan Note (Signed)
  Patient re-educated about  the importance of commitment to a  minimum of 150 minutes of exercise per week as able.  The importance of healthy food choices with portion control discussed, as well as eating regularly and within a 12 hour window most days. The need to choose "clean , green" food 50 to 75% of the time is discussed, as well as to make water the primary drink and set a goal of 64 ounces water daily.       12/19/2022   10:31 AM 11/07/2022    3:58 PM 08/17/2022   10:09 AM  Weight /BMI  Weight 312 lb 1.9 oz 316 lb 305 lb 1.3 oz  Height 5\' 2"  (1.575 m) 5' 2.5" (1.588 m) 5\' 2"  (1.575 m)  BMI 57.09 kg/m2 56.88 kg/m2 55.8 kg/m2    Increase dose mounjaro

## 2023-01-16 ENCOUNTER — Other Ambulatory Visit: Payer: Self-pay

## 2023-01-18 ENCOUNTER — Other Ambulatory Visit (HOSPITAL_COMMUNITY): Payer: Self-pay

## 2023-01-22 IMAGING — MG MM DIGITAL SCREENING BILAT W/ TOMO AND CAD
6 of 10 series · 6 of 30 positions shown · non-contrast
Comparison: Previous exam(s).

ACR Breast Density Category a: The breast tissue is almost entirely
fatty.

CLINICAL DATA: Screening.

EXAM:
DIGITAL SCREENING BILATERAL MAMMOGRAM WITH TOMOSYNTHESIS AND CAD
TECHNIQUE: Bilateral screening digital craniocaudal and mediolateral oblique
mammograms were obtained. Bilateral screening digital breast
tomosynthesis was performed. The images were evaluated with
computer-aided detection.

[R MLO synth-2D (1 of 2)]
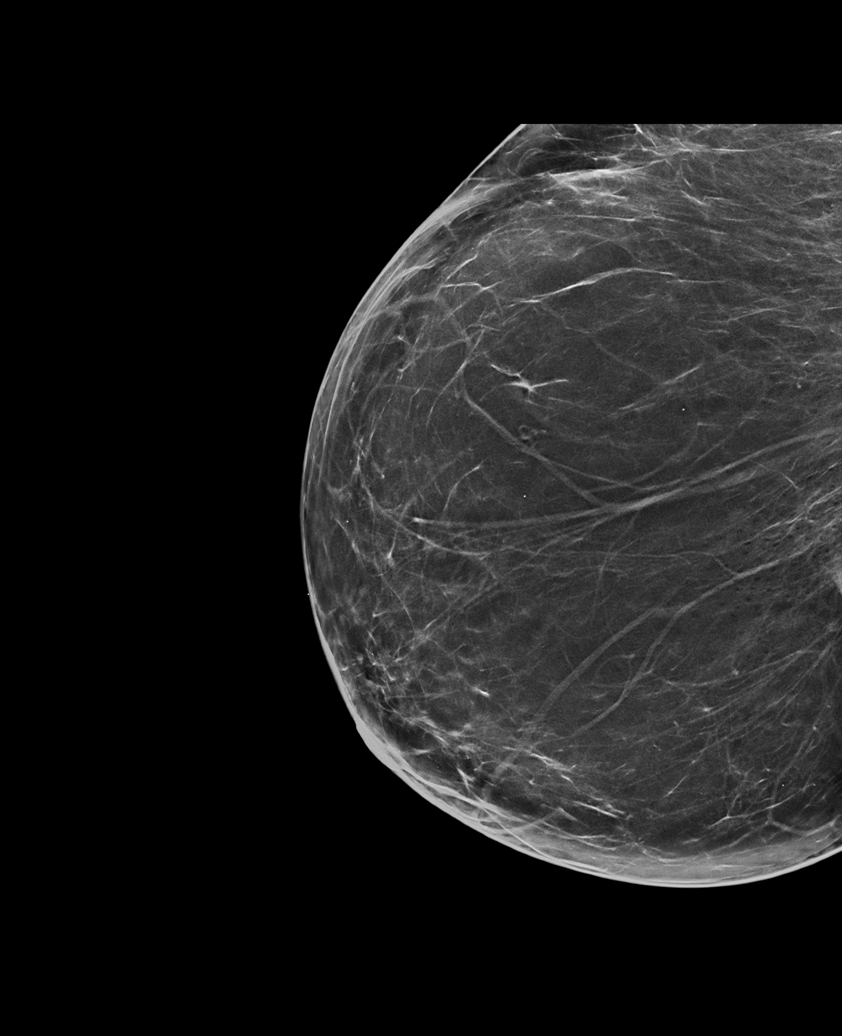

[R MLO synth-2D (2 of 2)]
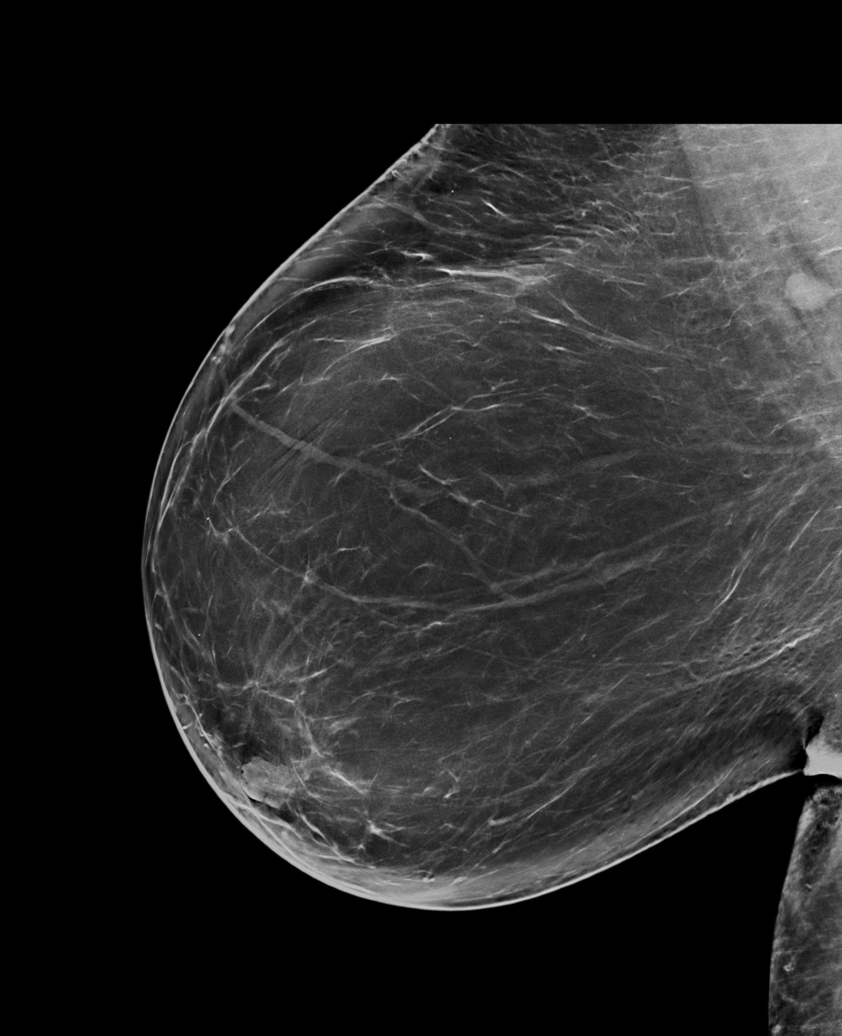

[R CC synth-2D]
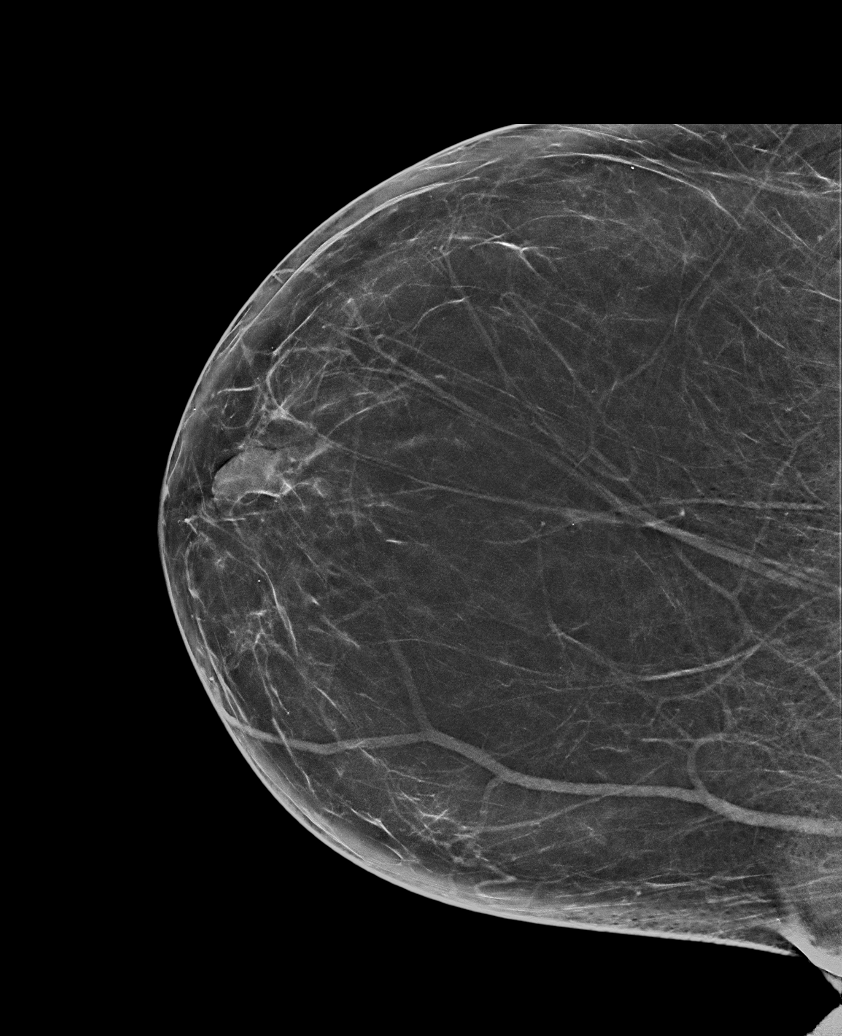

[L CC synth-2D]
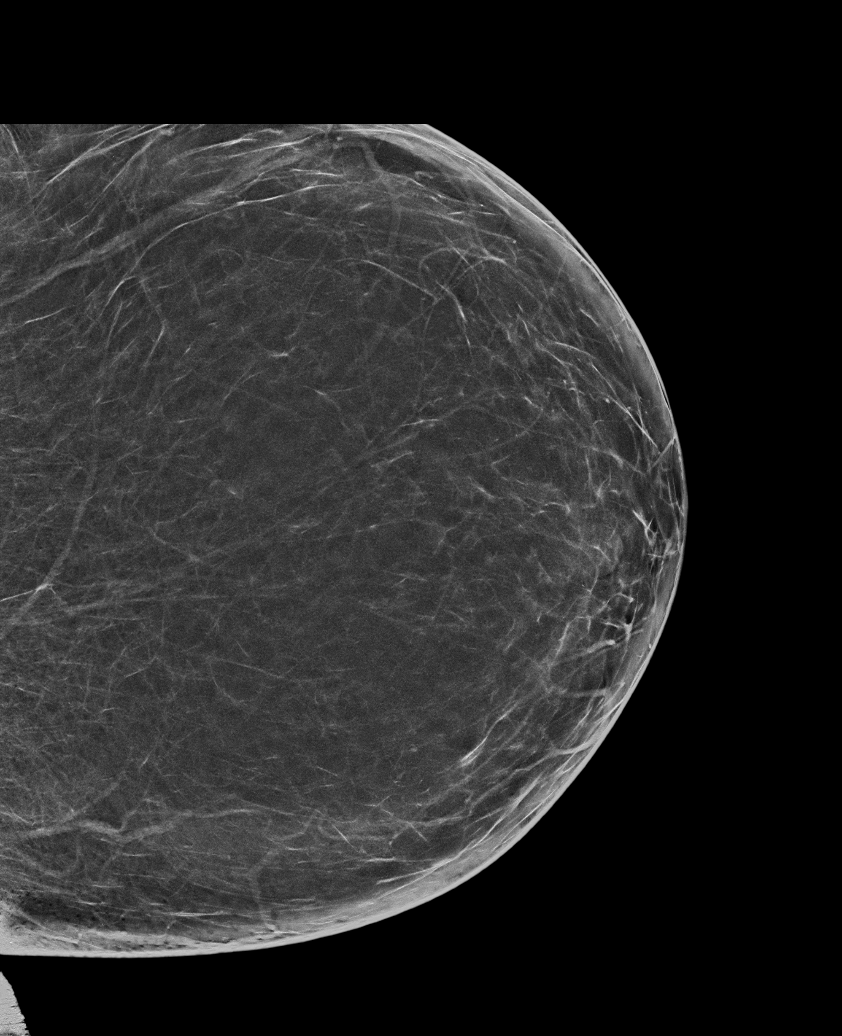

[L MLO synth-2D]
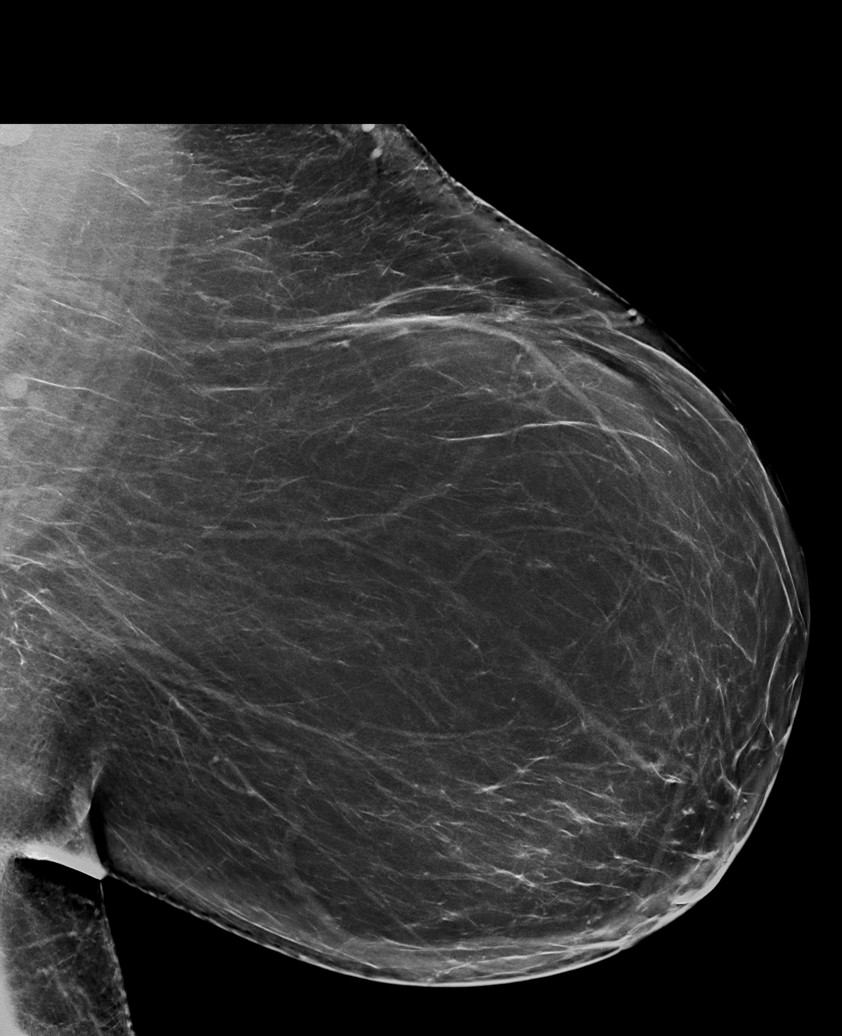

[R CC tomo · tomo slice 39/76.0]
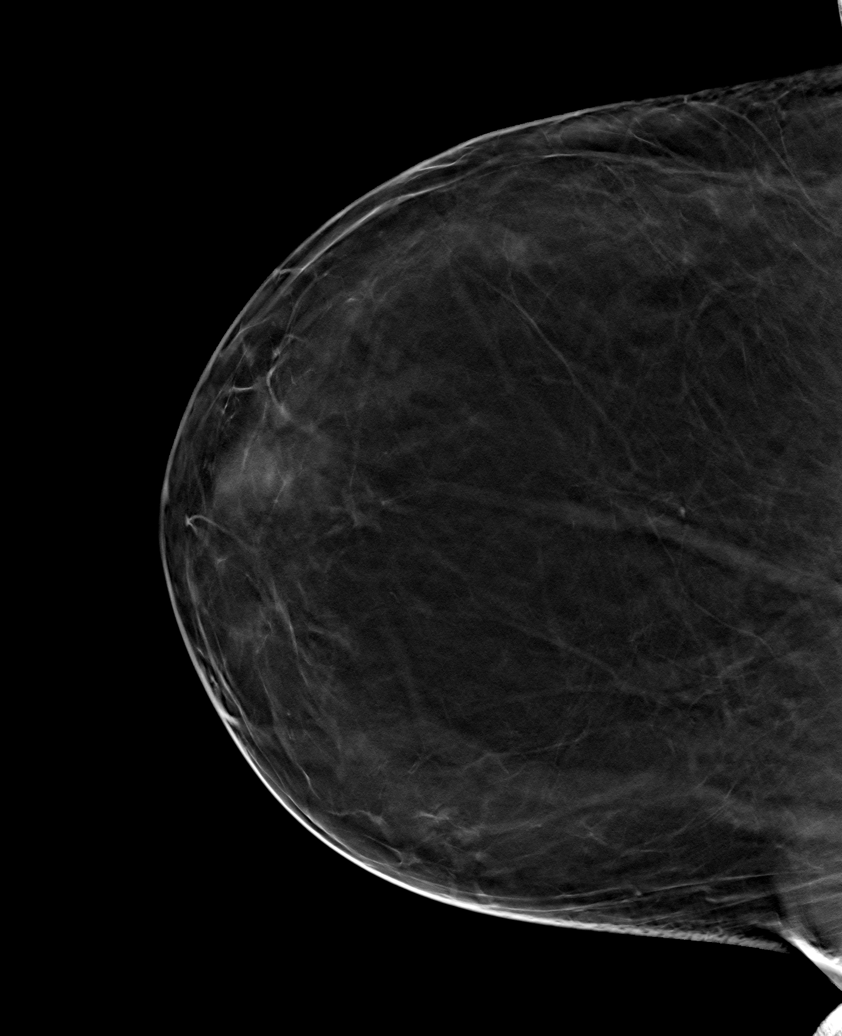

[6 of 30 positions shown; findings below may reference images not displayed]

FINDINGS: There are no findings suspicious for malignancy.
IMPRESSION: No mammographic evidence of malignancy. A result letter of this
screening mammogram will be mailed directly to the patient.

RECOMMENDATION:
Screening mammogram in one year. (Code:0E-3-N98)

BI-RADS CATEGORY  1: Negative.

## 2023-01-31 ENCOUNTER — Encounter: Payer: Self-pay | Admitting: Orthopedic Surgery

## 2023-01-31 ENCOUNTER — Ambulatory Visit: Payer: Commercial Managed Care - PPO | Admitting: Orthopedic Surgery

## 2023-01-31 VITALS — BP 106/72 | HR 69 | Ht 62.0 in | Wt 316.0 lb

## 2023-01-31 DIAGNOSIS — G5762 Lesion of plantar nerve, left lower limb: Secondary | ICD-10-CM

## 2023-01-31 NOTE — Progress Notes (Signed)
F/u  Chief Complaint  Patient presents with   Foot Pain    Fu left foot  patient says its better no pain    Encounter Diagnosis  Name Primary?   Morton's neuroma of left foot Yes    Meds: on 100 mg neurontin   The pain is less the numbness is tolerable  Symptoms incr with tight shoes or standing long time   Discussed surgery v. Continued gabapentin and good shoe wear.  Plan - continue neuronitn fu prn

## 2023-02-13 ENCOUNTER — Other Ambulatory Visit: Payer: Self-pay

## 2023-03-11 ENCOUNTER — Other Ambulatory Visit: Payer: Self-pay | Admitting: Family Medicine

## 2023-03-12 ENCOUNTER — Other Ambulatory Visit (HOSPITAL_COMMUNITY): Payer: Self-pay

## 2023-03-12 ENCOUNTER — Other Ambulatory Visit: Payer: Self-pay

## 2023-03-12 MED ORDER — CETIRIZINE HCL 10 MG PO TABS
10.0000 mg | ORAL_TABLET | Freq: Every day | ORAL | 0 refills | Status: DC | PRN
  Filled 2023-03-12: qty 30, 30d supply, fill #0

## 2023-03-28 DIAGNOSIS — E669 Obesity, unspecified: Secondary | ICD-10-CM | POA: Diagnosis not present

## 2023-03-28 DIAGNOSIS — E1169 Type 2 diabetes mellitus with other specified complication: Secondary | ICD-10-CM | POA: Diagnosis not present

## 2023-03-28 DIAGNOSIS — E785 Hyperlipidemia, unspecified: Secondary | ICD-10-CM | POA: Diagnosis not present

## 2023-03-29 LAB — LIPID PANEL
Chol/HDL Ratio: 2.9 {ratio} (ref 0.0–4.4)
Cholesterol, Total: 169 mg/dL (ref 100–199)
HDL: 59 mg/dL (ref >39)
LDL Chol Calc (NIH): 99 mg/dL (ref 0–99)
Triglycerides: 52 mg/dL (ref 0–149)
VLDL Cholesterol Cal: 11 mg/dL (ref 5–40)

## 2023-03-29 LAB — HEMOGLOBIN A1C
Est. average glucose Bld gHb Est-mCnc: 177 mg/dL
Hgb A1c MFr Bld: 7.8 % — ABNORMAL HIGH (ref 4.8–5.6)

## 2023-03-29 LAB — CMP14+EGFR
ALT: 9 [IU]/L (ref 0–32)
AST: 17 [IU]/L (ref 0–40)
Albumin: 3.8 g/dL — ABNORMAL LOW (ref 3.9–4.9)
Alkaline Phosphatase: 102 [IU]/L (ref 44–121)
BUN/Creatinine Ratio: 17 (ref 9–23)
BUN: 12 mg/dL (ref 6–24)
Bilirubin Total: 0.4 mg/dL (ref 0.0–1.2)
CO2: 22 mmol/L (ref 20–29)
Calcium: 9 mg/dL (ref 8.7–10.2)
Chloride: 105 mmol/L (ref 96–106)
Creatinine, Ser: 0.69 mg/dL (ref 0.57–1.00)
Globulin, Total: 2.8 g/dL (ref 1.5–4.5)
Glucose: 154 mg/dL — ABNORMAL HIGH (ref 70–99)
Potassium: 4.4 mmol/L (ref 3.5–5.2)
Sodium: 140 mmol/L (ref 134–144)
Total Protein: 6.6 g/dL (ref 6.0–8.5)
eGFR: 106 mL/min/{1.73_m2} (ref >59)

## 2023-04-04 ENCOUNTER — Ambulatory Visit: Payer: Commercial Managed Care - PPO | Admitting: Family Medicine

## 2023-04-04 ENCOUNTER — Other Ambulatory Visit: Payer: Self-pay

## 2023-04-04 ENCOUNTER — Encounter: Payer: Self-pay | Admitting: Family Medicine

## 2023-04-04 ENCOUNTER — Other Ambulatory Visit (HOSPITAL_COMMUNITY): Payer: Self-pay

## 2023-04-04 VITALS — BP 111/73 | HR 74 | Ht 62.0 in | Wt 317.1 lb

## 2023-04-04 DIAGNOSIS — J309 Allergic rhinitis, unspecified: Secondary | ICD-10-CM | POA: Diagnosis not present

## 2023-04-04 DIAGNOSIS — Z6841 Body Mass Index (BMI) 40.0 and over, adult: Secondary | ICD-10-CM | POA: Diagnosis not present

## 2023-04-04 DIAGNOSIS — E559 Vitamin D deficiency, unspecified: Secondary | ICD-10-CM | POA: Diagnosis not present

## 2023-04-04 DIAGNOSIS — E1169 Type 2 diabetes mellitus with other specified complication: Secondary | ICD-10-CM

## 2023-04-04 DIAGNOSIS — E785 Hyperlipidemia, unspecified: Secondary | ICD-10-CM | POA: Diagnosis not present

## 2023-04-04 DIAGNOSIS — E669 Obesity, unspecified: Secondary | ICD-10-CM | POA: Diagnosis not present

## 2023-04-04 MED ORDER — ROSUVASTATIN CALCIUM 5 MG PO TABS
5.0000 mg | ORAL_TABLET | ORAL | 3 refills | Status: DC
  Filled 2023-04-04: qty 24, 84d supply, fill #0
  Filled 2023-05-27 – 2023-05-31 (×2): qty 24, 84d supply, fill #1
  Filled 2023-08-27: qty 24, 84d supply, fill #2
  Filled 2024-02-14: qty 24, 84d supply, fill #3

## 2023-04-04 MED ORDER — METFORMIN HCL ER 500 MG PO TB24
500.0000 mg | ORAL_TABLET | Freq: Every day | ORAL | 1 refills | Status: DC
  Filled 2023-04-04: qty 90, 90d supply, fill #0
  Filled 2023-06-01 – 2023-06-25 (×2): qty 90, 90d supply, fill #1

## 2023-04-04 MED ORDER — TIRZEPATIDE 15 MG/0.5ML ~~LOC~~ SOAJ
15.0000 mg | SUBCUTANEOUS | 5 refills | Status: DC
  Filled 2023-04-04: qty 2, 28d supply, fill #0
  Filled 2023-05-07: qty 2, 28d supply, fill #1
  Filled 2023-06-01: qty 2, 28d supply, fill #2
  Filled 2023-06-25: qty 2, 28d supply, fill #3
  Filled 2023-07-26 – 2023-08-02 (×2): qty 2, 28d supply, fill #4
  Filled 2023-08-27: qty 2, 28d supply, fill #5

## 2023-04-04 NOTE — Patient Instructions (Signed)
Follow-up in 13 to 14 weeks, call if you need me sooner.  Blood sugar is now uncontrolled.  You need to change food choices.  New is metformin 500 mg once daily, continue Mounjaro 15 mg weekly.  Please check and document fasting blood sugar at least 4 times per week.  Goal for fasting blood sugar is 80-1 20.  If your blood sugar is consistently above 120 fasting please  send a message I will increase the dose of the metformin.  Good foot exam today.  Call for therapy as we have discussed repeatedly in the past.  Please commit to taking Crestor twice weekly for vascular health.  Nonfasting HbA1c Chem-7 and EGFR TSH and vitamin D to be drawn 3 to 5 days before your next appointment.  Best for 2025!    Thank you for trusting [place] with your care.

## 2023-04-05 NOTE — Assessment & Plan Note (Signed)
Deteriorated, change food choice , add metformin and start regular testing and exerciae , Message in 3 to 4 weeks if still uncontroled Ms. Keenon is reminded of the importance of commitment to daily physical activity for 30 minutes or more, as able and the need to limit carbohydrate intake to 30 to 60 grams per meal to help with blood sugar control.   The need to take medication as prescribed, test blood sugar as directed, and to call between visits if there is a concern that blood sugar is uncontrolled is also discussed.   Ms. Hegger is reminded of the importance of daily foot exam, annual eye examination, and good blood sugar, blood pressure and cholesterol control.     Latest Ref Rng & Units 03/28/2023   10:46 AM 12/05/2022   11:01 AM 08/16/2022   11:17 AM 04/26/2022    4:50 PM 01/23/2022   10:52 AM  Diabetic Labs  HbA1c 4.8 - 5.6 % 7.8  6.8   6.1  6.2   Micro/Creat Ratio 0 - 29 mg/g creat  13      Chol 100 - 199 mg/dL 960   454   098   HDL >11 mg/dL 59   59   62   Calc LDL 0 - 99 mg/dL 99   99   914   Triglycerides 0 - 149 mg/dL 52   49   54   Creatinine 0.57 - 1.00 mg/dL 7.82   9.56  2.13  0.86       04/04/2023   10:10 AM 01/31/2023    9:14 AM 12/20/2022    9:17 AM 12/19/2022   10:31 AM 11/07/2022    3:58 PM 08/17/2022   10:09 AM 05/29/2022   11:03 AM  BP/Weight  Systolic BP 111 106 110 104 144 112 121  Diastolic BP 73 72 66 67 85 69 83  Wt. (Lbs) 317.12 316 311 312.12 316 305.08   BMI 58 kg/m2 57.8 kg/m2 56.88 kg/m2 57.09 kg/m2 56.88 kg/m2 55.8 kg/m2       Latest Ref Rng & Units 04/04/2023   10:00 AM 08/07/2022   12:00 AM  Foot/eye exam completion dates  Eye Exam No Retinopathy  No Retinopathy      Foot Form Completion  Done      This result is from an external source.      Updated lab needed at/ before next visit.

## 2023-04-05 NOTE — Progress Notes (Signed)
   [name]     MRN: [id number]      DOB: 02-May-1975  Chief Complaint  Patient presents with   Follow-up    Follow  up    HPI [name] is here for follow up and re-evaluation of chronic medical conditions, medication management and review of any available recent lab and radiology data.  Preventive health is updated, specifically  Cancer screening and Immunization.   Questions or concerns regarding consultations or procedures which the PT has had in the interim are  addressed. The PT denies any adverse reactions to current medications since the last visit.  Not been testing blood sugar, "a lot going on" still has not started therapy to cope  with stress though all other family members are in therapy, not depressed or anxious  ROS Denies recent fever or chills. Denies sinus pressure, nasal congestion, ear pain or sore throat. Denies chest congestion, productive cough or wheezing. Denies chest pains, palpitations and leg swelling Denies abdominal pain, nausea, vomiting,diarrhea or constipation.   Denies dysuria, frequency, hesitancy or incontinence. Denies joint pain, swelling and limitation in mobility. Denies headaches, seizures, numbness, or tingling. Denies depression, anxiety or insomnia. Denies skin break down or rash.   PE  BP 111/73 (BP Location: Right Arm, Patient Position: Sitting, Cuff Size: Large)   Pulse 74   Ht 5\' 2"  (1.575 m)   Wt (!) 317 lb 1.9 oz (143.8 kg)   SpO2 98%   BMI 58.00 kg/m   Patient alert and oriented and in no cardiopulmonary distress.  HEENT: No facial asymmetry, EOMI,     Neck supple .  Chest: Clear to auscultation bilaterally.  CVS: S1, S2 no murmurs, no S3.Regular rate.  ABD: Soft non tender.   Ext: No edema  MS: Adequate ROM spine, shoulders, hips and knees.  Skin: Intact, no ulcerations or rash noted.  Psych: Good eye contact, normal affect. Memory intact not anxious or depressed appearing.  CNS: CN 2-12 intact, power,   normal throughout.no focal deficits noted.   Assessment & Plan  No problem-specific Assessment & Plan notes found for this encounter.

## 2023-04-05 NOTE — Assessment & Plan Note (Signed)
Controlled, no change in medication  

## 2023-04-05 NOTE — Assessment & Plan Note (Signed)
  Patient re-educated about  the importance of commitment to a  minimum of 150 minutes of exercise per week as able.  The importance of healthy food choices with portion control discussed, as well as eating regularly and within a 12 hour window most days. The need to choose "clean , green" food 50 to 75% of the time is discussed, as well as to make water the primary drink and set a goal of 64 ounces water daily.       04/04/2023   10:10 AM 01/31/2023    9:14 AM 12/20/2022    9:17 AM  Weight /BMI  Weight 317 lb 1.9 oz 316 lb 311 lb  Height 5\' 2"  (1.575 m) 5\' 2"  (1.575 m) 5\' 2"  (1.575 m)  BMI 58 kg/m2 57.8 kg/m2 56.88 kg/m2    Unchanged, work at lifestyle change for weight loss

## 2023-04-05 NOTE — Assessment & Plan Note (Signed)
Updated lab needed at/ before next visit.   

## 2023-04-05 NOTE — Assessment & Plan Note (Signed)
Hyperlipidemia:Low fat diet discussed and encouraged.   Lipid Panel  Lab Results  Component Value Date   CHOL 169 03/28/2023   HDL 59 03/28/2023   LDLCALC 99 03/28/2023   TRIG 52 03/28/2023   CHOLHDL 2.9 03/28/2023     Needs to commit to twice weekly statin

## 2023-04-11 ENCOUNTER — Ambulatory Visit (HOSPITAL_COMMUNITY): Payer: Commercial Managed Care - PPO

## 2023-04-12 ENCOUNTER — Ambulatory Visit (HOSPITAL_COMMUNITY)
Admission: RE | Admit: 2023-04-12 | Discharge: 2023-04-12 | Disposition: A | Discharge status: Home / Self Care | Payer: Commercial Managed Care - PPO | Source: Ambulatory Visit | Attending: Family Medicine | Admitting: Family Medicine

## 2023-04-12 ENCOUNTER — Encounter (HOSPITAL_COMMUNITY): Payer: Self-pay

## 2023-04-12 DIAGNOSIS — Z1231 Encounter for screening mammogram for malignant neoplasm of breast: Secondary | ICD-10-CM | POA: Diagnosis not present

## 2023-05-08 ENCOUNTER — Other Ambulatory Visit: Payer: Self-pay

## 2023-05-27 ENCOUNTER — Other Ambulatory Visit: Payer: Self-pay | Admitting: Family Medicine

## 2023-05-27 ENCOUNTER — Other Ambulatory Visit (HOSPITAL_COMMUNITY): Payer: Self-pay

## 2023-05-27 ENCOUNTER — Other Ambulatory Visit: Payer: Self-pay

## 2023-05-27 MED ORDER — FLUTICASONE PROPIONATE 50 MCG/ACT NA SUSP
1.0000 | Freq: Two times a day (BID) | NASAL | 6 refills | Status: AC
  Filled 2023-05-27 – 2023-05-31 (×2): qty 16, 30d supply, fill #0
  Filled 2023-07-26 – 2023-08-02 (×3): qty 16, 30d supply, fill #1

## 2023-05-28 ENCOUNTER — Other Ambulatory Visit (HOSPITAL_BASED_OUTPATIENT_CLINIC_OR_DEPARTMENT_OTHER): Payer: Self-pay

## 2023-05-31 ENCOUNTER — Other Ambulatory Visit (HOSPITAL_COMMUNITY): Payer: Self-pay

## 2023-06-01 ENCOUNTER — Other Ambulatory Visit (HOSPITAL_COMMUNITY): Payer: Self-pay

## 2023-06-03 ENCOUNTER — Other Ambulatory Visit: Payer: Self-pay

## 2023-06-03 ENCOUNTER — Other Ambulatory Visit (HOSPITAL_COMMUNITY): Payer: Self-pay

## 2023-06-25 ENCOUNTER — Other Ambulatory Visit (HOSPITAL_COMMUNITY): Payer: Self-pay

## 2023-06-26 ENCOUNTER — Other Ambulatory Visit: Payer: Self-pay

## 2023-07-05 ENCOUNTER — Ambulatory Visit: Payer: Commercial Managed Care - PPO | Admitting: Family Medicine

## 2023-07-26 ENCOUNTER — Other Ambulatory Visit: Payer: Self-pay

## 2023-07-26 ENCOUNTER — Other Ambulatory Visit (HOSPITAL_COMMUNITY): Payer: Self-pay

## 2023-07-26 ENCOUNTER — Other Ambulatory Visit: Payer: Self-pay | Admitting: Family Medicine

## 2023-07-26 MED ORDER — CETIRIZINE HCL 10 MG PO TABS
10.0000 mg | ORAL_TABLET | Freq: Every day | ORAL | 0 refills | Status: AC | PRN
  Filled 2023-07-26 – 2023-08-02 (×4): qty 30, 30d supply, fill #0

## 2023-07-26 MED ORDER — PANTOPRAZOLE SODIUM 40 MG PO TBEC
40.0000 mg | DELAYED_RELEASE_TABLET | Freq: Every day | ORAL | 3 refills | Status: DC
  Filled 2023-07-26 – 2023-08-24 (×2): qty 90, 90d supply, fill #0
  Filled 2023-11-18: qty 90, 90d supply, fill #1
  Filled 2024-02-24: qty 90, 90d supply, fill #2

## 2023-07-29 ENCOUNTER — Other Ambulatory Visit: Payer: Self-pay

## 2023-07-30 ENCOUNTER — Encounter: Payer: Self-pay | Admitting: Pharmacist

## 2023-07-30 ENCOUNTER — Other Ambulatory Visit: Payer: Self-pay

## 2023-07-30 ENCOUNTER — Other Ambulatory Visit (HOSPITAL_COMMUNITY): Payer: Self-pay

## 2023-08-01 ENCOUNTER — Other Ambulatory Visit: Payer: Self-pay

## 2023-08-01 ENCOUNTER — Ambulatory Visit: Admitting: Family Medicine

## 2023-08-02 ENCOUNTER — Other Ambulatory Visit (HOSPITAL_COMMUNITY): Payer: Self-pay

## 2023-08-02 ENCOUNTER — Other Ambulatory Visit: Payer: Self-pay

## 2023-08-07 DIAGNOSIS — E559 Vitamin D deficiency, unspecified: Secondary | ICD-10-CM | POA: Diagnosis not present

## 2023-08-07 DIAGNOSIS — E669 Obesity, unspecified: Secondary | ICD-10-CM | POA: Diagnosis not present

## 2023-08-07 DIAGNOSIS — E1169 Type 2 diabetes mellitus with other specified complication: Secondary | ICD-10-CM | POA: Diagnosis not present

## 2023-08-08 LAB — BMP8+EGFR
BUN/Creatinine Ratio: 20 (ref 9–23)
BUN: 12 mg/dL (ref 6–24)
CO2: 22 mmol/L (ref 20–29)
Calcium: 8.9 mg/dL (ref 8.7–10.2)
Chloride: 105 mmol/L (ref 96–106)
Creatinine, Ser: 0.59 mg/dL (ref 0.57–1.00)
Glucose: 116 mg/dL — ABNORMAL HIGH (ref 70–99)
Potassium: 4.1 mmol/L (ref 3.5–5.2)
Sodium: 142 mmol/L (ref 134–144)
eGFR: 110 mL/min/{1.73_m2} (ref >59)

## 2023-08-08 LAB — HEMOGLOBIN A1C
Est. average glucose Bld gHb Est-mCnc: 157 mg/dL
Hgb A1c MFr Bld: 7.1 % — ABNORMAL HIGH (ref 4.8–5.6)

## 2023-08-08 LAB — VITAMIN D 25 HYDROXY (VIT D DEFICIENCY, FRACTURES): Vit D, 25-Hydroxy: 23 ng/mL — ABNORMAL LOW (ref 30.0–100.0)

## 2023-08-09 LAB — TSH: TSH: 2.38 u[IU]/mL (ref 0.450–4.500)

## 2023-08-13 ENCOUNTER — Other Ambulatory Visit (HOSPITAL_COMMUNITY): Payer: Self-pay

## 2023-08-24 ENCOUNTER — Other Ambulatory Visit (HOSPITAL_COMMUNITY): Payer: Self-pay

## 2023-08-27 ENCOUNTER — Other Ambulatory Visit (HOSPITAL_COMMUNITY): Payer: Self-pay

## 2023-08-27 ENCOUNTER — Other Ambulatory Visit: Payer: Self-pay

## 2023-08-27 ENCOUNTER — Other Ambulatory Visit: Payer: Self-pay | Admitting: Family Medicine

## 2023-08-28 ENCOUNTER — Other Ambulatory Visit (HOSPITAL_COMMUNITY): Payer: Self-pay

## 2023-08-29 ENCOUNTER — Other Ambulatory Visit (HOSPITAL_COMMUNITY): Payer: Self-pay

## 2023-08-30 ENCOUNTER — Encounter (HOSPITAL_COMMUNITY): Payer: Self-pay

## 2023-08-30 ENCOUNTER — Other Ambulatory Visit (HOSPITAL_COMMUNITY): Payer: Self-pay

## 2023-09-02 DIAGNOSIS — H524 Presbyopia: Secondary | ICD-10-CM | POA: Diagnosis not present

## 2023-09-02 LAB — HM DIABETES EYE EXAM

## 2023-09-03 ENCOUNTER — Other Ambulatory Visit: Payer: Self-pay | Admitting: Family Medicine

## 2023-09-03 ENCOUNTER — Other Ambulatory Visit (HOSPITAL_COMMUNITY): Payer: Self-pay

## 2023-09-04 ENCOUNTER — Other Ambulatory Visit (HOSPITAL_COMMUNITY): Payer: Self-pay

## 2023-09-06 ENCOUNTER — Other Ambulatory Visit: Payer: Self-pay

## 2023-09-06 ENCOUNTER — Ambulatory Visit: Admitting: Family Medicine

## 2023-09-06 ENCOUNTER — Encounter: Payer: Self-pay | Admitting: Family Medicine

## 2023-09-06 VITALS — BP 116/77 | HR 92 | Resp 18 | Ht 62.5 in | Wt 313.1 lb

## 2023-09-06 DIAGNOSIS — Z7985 Long-term (current) use of injectable non-insulin antidiabetic drugs: Secondary | ICD-10-CM | POA: Diagnosis not present

## 2023-09-06 DIAGNOSIS — E559 Vitamin D deficiency, unspecified: Secondary | ICD-10-CM

## 2023-09-06 DIAGNOSIS — Z23 Encounter for immunization: Secondary | ICD-10-CM

## 2023-09-06 DIAGNOSIS — Z7984 Long term (current) use of oral hypoglycemic drugs: Secondary | ICD-10-CM | POA: Diagnosis not present

## 2023-09-06 DIAGNOSIS — Z6841 Body Mass Index (BMI) 40.0 and over, adult: Secondary | ICD-10-CM | POA: Diagnosis not present

## 2023-09-06 DIAGNOSIS — E1169 Type 2 diabetes mellitus with other specified complication: Secondary | ICD-10-CM

## 2023-09-06 DIAGNOSIS — E785 Hyperlipidemia, unspecified: Secondary | ICD-10-CM

## 2023-09-06 MED ORDER — SYNJARDY 12.5-1000 MG PO TABS
1.0000 | ORAL_TABLET | Freq: Every day | ORAL | 1 refills | Status: DC
  Filled 2023-09-06: qty 90, 90d supply, fill #0
  Filled 2023-12-13: qty 90, 90d supply, fill #1

## 2023-09-06 NOTE — Patient Instructions (Addendum)
 F/u in 13 weeks, call if you need me sooner, shingrix #2 at visit  Shingrix #1   Pls send for eye exam My eye dr HP done this Monday     Stop metformin   Start synjardy one daily which is a combination medication that has metformin  in it  Continue Mounjaro  15 mg  pLease start testing FBG goal 80 to 120  It is important that you exercise regularly at least 30 minutes 5 times a week. If you develop chest pain, have severe difficulty breathing, or feel very tired, stop exercising immediately and seek medical attention  Good that you quit the soda  Bloodwork improved  HBA1C goal is under 7  It is important that you exercise regularly at least 30 minutes 5 times a week. If you develop chest pain, have severe difficulty breathing, or feel very tired, stop exercising immediately and seek medical attention      Fasting lipid, cmp and EGFR, hBA1C, urine ACR, , CBC, tSH 8/16 or after

## 2023-09-08 ENCOUNTER — Encounter: Payer: Self-pay | Admitting: Family Medicine

## 2023-09-08 DIAGNOSIS — Z23 Encounter for immunization: Secondary | ICD-10-CM | POA: Insufficient documentation

## 2023-09-08 NOTE — Assessment & Plan Note (Signed)
  Patient re-educated about  the importance of commitment to a  minimum of 150 minutes of exercise per week as able.  The importance of healthy food choices with portion control discussed, as well as eating regularly and within a 12 hour window most days. The need to choose "clean , green" food 50 to 75% of the time is discussed, as well as to make water the primary drink and set a goal of 64 ounces water daily.       09/06/2023    3:06 PM 04/04/2023   10:10 AM 01/31/2023    9:14 AM  Weight /BMI  Weight 313 lb 1.9 oz 317 lb 1.9 oz 316 lb  Height 5' 2.5" (1.588 m) 5\' 2"  (1.575 m) 5\' 2"  (1.575 m)  BMI 56.36 kg/m2 58 kg/m2 57.8 kg/m2    Slight improvement will work more consistently on this

## 2023-09-08 NOTE — Assessment & Plan Note (Signed)
 Improved commitment to taking med weekly needed

## 2023-09-08 NOTE — Assessment & Plan Note (Signed)
 Hyperlipidemia:Low fat diet discussed and encouraged.   Lipid Panel  Lab Results  Component Value Date   CHOL 169 03/28/2023   HDL 59 03/28/2023   LDLCALC 99 03/28/2023   TRIG 52 03/28/2023   CHOLHDL 2.9 03/28/2023     Controlled, no change in medication

## 2023-09-08 NOTE — Progress Notes (Signed)
 [name]     MRN: [id number]      DOB: March 25, 1976  Chief Complaint  Patient presents with   Medical Management of Chronic Issues    14 week follow up     HPI Ms. Quinones is here for follow up and re-evaluation of chronic medical conditions, medication management and review of any available recent lab and radiology data.  Preventive health is updated, specifically  Cancer screening and Immunization.   Questions or concerns regarding consultations or procedures which the PT has had in the interim are  addressed. The PT denies any adverse reactions to current medications since the last visit.  There are no new concerns.  There are no specific complaints  Not testing blood sugar, has cut back on sodas, no regulkarlexercise Denies polyuria, polydipsia, blurred vision , or hypoglycemic episodes.   ROS Denies recent fever or chills. Denies sinus pressure, nasal congestion, ear pain or sore throat. Denies chest congestion, productive cough or wheezing. Denies chest pains, palpitations and leg swelling Denies abdominal pain, nausea, vomiting,diarrhea or constipation.   Denies dysuria, frequency, hesitancy or incontinence. Denies joint pain, swelling and limitation in mobility. Denies headaches, seizures, numbness, or tingling. Denies depression, anxiety or insomnia. Denies skin break down or rash.   PE  BP 116/77   Pulse 92   Resp 18   Ht 5' 2.5" (1.588 m)   Wt (!) 313 lb 1.9 oz (142 kg)   SpO2 98%   BMI 56.36 kg/m   Patient alert and oriented and in no cardiopulmonary distress.  HEENT: No facial asymmetry, EOMI,     Neck supple .  Chest: Clear to auscultation bilaterally.  CVS: S1, S2 no murmurs, no S3.Regular rate.  ABD: Soft non tender.   Ext: No edema  MS: Adequate ROM spine, shoulders, hips and knees.  Skin: Intact, no ulcerations or rash noted.  Psych: Good eye contact, normal affect. Memory intact not anxious or depressed appearing.  CNS: CN 2-12  intact, power,  normal throughout.no focal deficits noted.   Assessment & Plan  Type 2 diabetes mellitus with obesity (HCC) Diabetes associated with hypertension and obesity Imprpved tho not at goal, start synjardy  stop metformin , continue mounjaro  15 mg Lifestyle change needed and regular testing  Ms. Corrie is reminded of the importance of commitment to daily physical activity for 30 minutes or more, as able and the need to limit carbohydrate intake to 30 to 60 grams per meal to help with blood sugar control.   The need to take medication as prescribed, test blood sugar as directed, and to call between visits if there is a concern that blood sugar is uncontrolled is also discussed.   Ms. Weathersby is reminded of the importance of daily foot exam, annual eye examination, and good blood sugar, blood pressure and cholesterol control.     Latest Ref Rng & Units 08/07/2023    2:49 PM 03/28/2023   10:46 AM 12/05/2022   11:01 AM 08/16/2022   11:17 AM 04/26/2022    4:50 PM  Diabetic Labs  HbA1c 4.8 - 5.6 % 7.1  7.8  6.8   6.1   Micro/Creat Ratio 0 - 29 mg/g creat   13     Chol 100 - 199 mg/dL  782   956    HDL >21 mg/dL  59   59    Calc LDL 0 - 99 mg/dL  99   99    Triglycerides 0 - 149 mg/dL  52  49    Creatinine 0.57 - 1.00 mg/dL 1.61  0.96   0.45  4.09       09/06/2023    3:06 PM 04/04/2023   10:10 AM 01/31/2023    9:14 AM 12/20/2022    9:17 AM 12/19/2022   10:31 AM 11/07/2022    3:58 PM 08/17/2022   10:09 AM  BP/Weight  Systolic BP 116 111 106 110 104 144 112  Diastolic BP 77 73 72 66 67 85 69  Wt. (Lbs) 313.12 317.12 316 311 312.12 316 305.08  BMI 56.36 kg/m2 58 kg/m2 57.8 kg/m2 56.88 kg/m2 57.09 kg/m2 56.88 kg/m2 55.8 kg/m2      Latest Ref Rng & Units 04/04/2023   10:00 AM 08/07/2022   12:00 AM  Foot/eye exam completion dates  Eye Exam No Retinopathy  No Retinopathy      Foot Form Completion  Done      This result is from an external source.        Vitamin D   deficiency Improved commitment to taking med weekly needed  Morbid obesity [facility])  Patient re-educated about  the importance of commitment to a  minimum of 150 minutes of exercise per week as able.  The importance of healthy food choices with portion control discussed, as well as eating regularly and within a 12 hour window most days. The need to choose "clean , green" food 50 to 75% of the time is discussed, as well as to make water the primary drink and set a goal of 64 ounces water daily.       09/06/2023    3:06 PM 04/04/2023   10:10 AM 01/31/2023    9:14 AM  Weight /BMI  Weight 313 lb 1.9 oz 317 lb 1.9 oz 316 lb  Height 5' 2.5" (1.588 m) 5\' 2"  (1.575 m) 5\' 2"  (1.575 m)  BMI 56.36 kg/m2 58 kg/m2 57.8 kg/m2    Slight improvement will work more consistently on this  Hyperlipidemia LDL goal <100 Hyperlipidemia:Low fat diet discussed and encouraged.   Lipid Panel  Lab Results  Component Value Date   CHOL 169 03/28/2023   HDL 59 03/28/2023   LDLCALC 99 03/28/2023   TRIG 52 03/28/2023   CHOLHDL 2.9 03/28/2023     Controlled, no change in medication

## 2023-09-08 NOTE — Assessment & Plan Note (Signed)
 Diabetes associated with hypertension and obesity Imprpved tho not at goal, start synjardy  stop metformin , continue mounjaro  15 mg Lifestyle change needed and regular testing  [name] is reminded of the importance of commitment to daily physical activity for 30 minutes or more, as able and the need to limit carbohydrate intake to 30 to 60 grams per meal to help with blood sugar control.   The need to take medication as prescribed, test blood sugar as directed, and to call between visits if there is a concern that blood sugar is uncontrolled is also discussed.   [name] is reminded of the importance of daily foot exam, annual eye examination, and good blood sugar, blood pressure and cholesterol control.     Latest Ref Rng & Units 08/07/2023    2:49 PM 03/28/2023   10:46 AM 12/05/2022   11:01 AM 08/16/2022   11:17 AM 04/26/2022    4:50 PM  Diabetic Labs  HbA1c 4.8 - 5.6 % 7.1  7.8  6.8   6.1   Micro/Creat Ratio 0 - 29 mg/g creat   13     Chol 100 - 199 mg/dL  409   811    HDL >91 mg/dL  59   59    Calc LDL 0 - 99 mg/dL  99   99    Triglycerides 0 - 149 mg/dL  52   49    Creatinine 0.57 - 1.00 mg/dL 4.78  2.95   6.21  3.08       09/06/2023    3:06 PM 04/04/2023   10:10 AM 01/31/2023    9:14 AM 12/20/2022    9:17 AM 12/19/2022   10:31 AM 11/07/2022    3:58 PM 08/17/2022   10:09 AM  BP/Weight  Systolic BP 116 111 106 110 104 144 112  Diastolic BP 77 73 72 66 67 85 69  Wt. (Lbs) 313.12 317.12 316 311 312.12 316 305.08  BMI 56.36 kg/m2 58 kg/m2 57.8 kg/m2 56.88 kg/m2 57.09 kg/m2 56.88 kg/m2 55.8 kg/m2      Latest Ref Rng & Units 04/04/2023   10:00 AM 08/07/2022   12:00 AM  Foot/eye exam completion dates  Eye Exam No Retinopathy  No Retinopathy      Foot Form Completion  Done      This result is from an external source.

## 2023-09-25 ENCOUNTER — Other Ambulatory Visit: Payer: Self-pay | Admitting: Family Medicine

## 2023-09-26 ENCOUNTER — Other Ambulatory Visit: Payer: Self-pay

## 2023-09-26 ENCOUNTER — Telehealth: Payer: Self-pay

## 2023-09-26 ENCOUNTER — Other Ambulatory Visit (HOSPITAL_COMMUNITY): Payer: Self-pay

## 2023-09-26 MED ORDER — TIRZEPATIDE 15 MG/0.5ML ~~LOC~~ SOAJ
15.0000 mg | SUBCUTANEOUS | 3 refills | Status: DC
  Filled 2023-09-26: qty 6, 84d supply, fill #0
  Filled 2023-12-13 (×2): qty 6, 84d supply, fill #1
  Filled 2024-03-07: qty 6, 84d supply, fill #2

## 2023-09-26 NOTE — Telephone Encounter (Signed)
 Pt needing refill of Mounjaro  15mg . Please advise

## 2023-09-26 NOTE — Telephone Encounter (Signed)
 Refill sent with 3 month supply

## 2023-10-01 ENCOUNTER — Ambulatory Visit: Admitting: Family Medicine

## 2023-10-11 ENCOUNTER — Other Ambulatory Visit (HOSPITAL_COMMUNITY): Payer: Self-pay

## 2023-10-31 ENCOUNTER — Encounter (HOSPITAL_COMMUNITY): Payer: Self-pay | Admitting: *Deleted

## 2023-11-04 ENCOUNTER — Encounter: Payer: Self-pay | Admitting: Family Medicine

## 2023-11-05 ENCOUNTER — Other Ambulatory Visit: Payer: Self-pay

## 2023-11-05 ENCOUNTER — Other Ambulatory Visit (HOSPITAL_COMMUNITY): Payer: Self-pay

## 2023-11-05 MED ORDER — FLUCONAZOLE 150 MG PO TABS
ORAL_TABLET | ORAL | 0 refills | Status: DC
  Filled 2023-11-05: qty 6, 90d supply, fill #0
  Filled 2023-11-05: qty 6, 84d supply, fill #0
  Filled 2024-02-14: qty 2, 2d supply, fill #1

## 2023-11-06 ENCOUNTER — Other Ambulatory Visit: Payer: Self-pay

## 2023-11-18 ENCOUNTER — Other Ambulatory Visit: Payer: Self-pay

## 2023-12-11 ENCOUNTER — Other Ambulatory Visit: Payer: Self-pay

## 2023-12-11 ENCOUNTER — Telehealth: Payer: Self-pay | Admitting: Family Medicine

## 2023-12-11 DIAGNOSIS — Z1329 Encounter for screening for other suspected endocrine disorder: Secondary | ICD-10-CM

## 2023-12-11 DIAGNOSIS — E669 Obesity, unspecified: Secondary | ICD-10-CM | POA: Diagnosis not present

## 2023-12-11 DIAGNOSIS — E785 Hyperlipidemia, unspecified: Secondary | ICD-10-CM

## 2023-12-11 DIAGNOSIS — E1169 Type 2 diabetes mellitus with other specified complication: Secondary | ICD-10-CM | POA: Diagnosis not present

## 2023-12-11 NOTE — Telephone Encounter (Signed)
 Patient came by office need blood work order waiting in the lobby,

## 2023-12-11 NOTE — Telephone Encounter (Signed)
 ordered

## 2023-12-13 ENCOUNTER — Other Ambulatory Visit: Payer: Self-pay

## 2023-12-13 ENCOUNTER — Encounter: Payer: Self-pay | Admitting: Pharmacist

## 2023-12-13 ENCOUNTER — Other Ambulatory Visit (HOSPITAL_COMMUNITY): Payer: Self-pay

## 2023-12-13 ENCOUNTER — Ambulatory Visit: Admitting: Family Medicine

## 2023-12-13 ENCOUNTER — Encounter: Payer: Self-pay | Admitting: Family Medicine

## 2023-12-13 VITALS — BP 127/76 | HR 77 | Resp 16 | Ht 62.0 in | Wt 293.0 lb

## 2023-12-13 DIAGNOSIS — Z0001 Encounter for general adult medical examination with abnormal findings: Secondary | ICD-10-CM

## 2023-12-13 DIAGNOSIS — Z6841 Body Mass Index (BMI) 40.0 and over, adult: Secondary | ICD-10-CM

## 2023-12-13 DIAGNOSIS — E1169 Type 2 diabetes mellitus with other specified complication: Secondary | ICD-10-CM

## 2023-12-13 DIAGNOSIS — Z23 Encounter for immunization: Secondary | ICD-10-CM | POA: Diagnosis not present

## 2023-12-13 DIAGNOSIS — Z0000 Encounter for general adult medical examination without abnormal findings: Secondary | ICD-10-CM | POA: Insufficient documentation

## 2023-12-13 LAB — CBC WITH DIFFERENTIAL/PLATELET
Basophils Absolute: 0.1 x10E3/uL (ref 0.0–0.2)
Basos: 1 %
EOS (ABSOLUTE): 0.2 x10E3/uL (ref 0.0–0.4)
Eos: 2 %
Hematocrit: 38 % (ref 34.0–46.6)
Hemoglobin: 11.4 g/dL (ref 11.1–15.9)
Immature Grans (Abs): 0 x10E3/uL (ref 0.0–0.1)
Immature Granulocytes: 0 %
Lymphocytes Absolute: 1.9 x10E3/uL (ref 0.7–3.1)
Lymphs: 29 %
MCH: 26.5 pg — ABNORMAL LOW (ref 26.6–33.0)
MCHC: 30 g/dL — ABNORMAL LOW (ref 31.5–35.7)
MCV: 88 fL (ref 79–97)
Monocytes Absolute: 0.3 x10E3/uL (ref 0.1–0.9)
Monocytes: 5 %
Neutrophils Absolute: 4.2 x10E3/uL (ref 1.4–7.0)
Neutrophils: 63 %
Platelets: 296 x10E3/uL (ref 150–450)
RBC: 4.31 x10E6/uL (ref 3.77–5.28)
RDW: 14.4 % (ref 11.7–15.4)
WBC: 6.6 x10E3/uL (ref 3.4–10.8)

## 2023-12-13 LAB — CMP14+EGFR
ALT: 9 IU/L (ref 0–32)
AST: 11 IU/L (ref 0–40)
Albumin: 4.2 g/dL (ref 3.9–4.9)
Alkaline Phosphatase: 81 IU/L (ref 44–121)
BUN/Creatinine Ratio: 18 (ref 9–23)
BUN: 11 mg/dL (ref 6–24)
Bilirubin Total: 0.4 mg/dL (ref 0.0–1.2)
CO2: 21 mmol/L (ref 20–29)
Calcium: 9 mg/dL (ref 8.7–10.2)
Chloride: 104 mmol/L (ref 96–106)
Creatinine, Ser: 0.62 mg/dL (ref 0.57–1.00)
Globulin, Total: 2.5 g/dL (ref 1.5–4.5)
Glucose: 93 mg/dL (ref 70–99)
Potassium: 4.3 mmol/L (ref 3.5–5.2)
Sodium: 139 mmol/L (ref 134–144)
Total Protein: 6.7 g/dL (ref 6.0–8.5)
eGFR: 108 mL/min/1.73 (ref >59)

## 2023-12-13 LAB — LIPID PANEL
Chol/HDL Ratio: 2.8 ratio (ref 0.0–4.4)
Cholesterol, Total: 177 mg/dL (ref 100–199)
HDL: 63 mg/dL (ref >39)
LDL Chol Calc (NIH): 102 mg/dL — ABNORMAL HIGH (ref 0–99)
Triglycerides: 63 mg/dL (ref 0–149)
VLDL Cholesterol Cal: 12 mg/dL (ref 5–40)

## 2023-12-13 LAB — HEMOGLOBIN A1C
Est. average glucose Bld gHb Est-mCnc: 137 mg/dL
Hgb A1c MFr Bld: 6.4 % — ABNORMAL HIGH (ref 4.8–5.6)

## 2023-12-13 LAB — MICROALBUMIN / CREATININE URINE RATIO
Creatinine, Urine: 109.8 mg/dL
Microalb/Creat Ratio: 3 mg/g{creat} (ref 0–29)
Microalbumin, Urine: 3.7 ug/mL

## 2023-12-13 LAB — TSH: TSH: 2.2 u[IU]/mL (ref 0.450–4.500)

## 2023-12-13 MED ORDER — FREESTYLE LIBRE 3 READER DEVI
1.0000 | 0 refills | Status: AC
  Filled 2023-12-13: qty 1, 1d supply, fill #0

## 2023-12-13 MED ORDER — FREESTYLE LIBRE 3 SENSOR MISC
1.0000 | 2 refills | Status: AC
  Filled 2023-12-13: qty 2, 28d supply, fill #0
  Filled 2024-03-07: qty 2, 28d supply, fill #1
  Filled 2024-04-05: qty 2, 28d supply, fill #2

## 2023-12-13 NOTE — Patient Instructions (Addendum)
 F/U first week in January  Shingrix  today  Nurse pls order continuous monitor for pt will see if covered Pls message for labs for Jan visit in mid [place]  ,It is important that you exercise regularly at least 30 minutes 5 times a week. If you develop chest pain, have severe difficulty breathing, or feel very tired, stop exercising immediately and seek medical attention   Think about what you will eat, plan ahead. Choose  clean, green, fresh or frozen over canned, processed or packaged foods which are more sugary, salty and fatty. 70 to 75% of food eaten should be vegetables and fruit. Three meals at set times with snacks allowed between meals, but they must be fruit or vegetables. Aim to eat over a 12 hour period , example 7 am to 7 pm, and STOP after  your last meal of the day. Drink water,generally about 64 ounces per day, no other drink is as healthy. Fruit juice is best enjoyed in a healthy way, by EATING the fruit.   Thanks for choosing [facility], we consider it a privelige to serve you.  Will enter colonoscopy referral pls fopllow through!

## 2023-12-13 NOTE — Progress Notes (Signed)
 [name]     MRN: [id number]      DOB: 09/22/1975  Chief Complaint  Patient presents with   Annual Exam    HPI: Patient is in for annual physical exam. No other health concerns are expressed or addressed at the visit. Recent labs,  are reviewed.and show great improvement Immunization is reviewed , and  updated    PE: BP 127/76   Pulse 77   Resp 16   Ht 5' 2 (1.575 m)   Wt 293 lb 0.3 oz (132.9 kg)   SpO2 96%   BMI 53.59 kg/m   Pleasant  female, alert and oriented x 3, in no cardio-pulmonary distress. Afebrile. HEENT No facial trauma or asymetry. Sinuses non tender.  Extra occullar muscles intact.. External ears normal, . Neck: supple, no adenopathy,JVD or thyromegaly.No bruits.  Chest: Clear to ascultation bilaterally.No crackles or wheezes. Non tender to palpation  Breast: Not examined,  normal mammogram, asymptomartic and up to date  Cardiovascular system; Heart sounds normal,  S1 and  S2 ,no S3.  No murmur, or thrill. Apical beat not displaced Peripheral pulses normal.  Abdomen: Soft, non tender, no organomegaly or masses. No bruits. Bowel sounds normal. No guarding, tenderness or rebound.   GU: Asymptomatic Not examined Pap is UTD  Musculoskeletal exam: Full ROM of spine, hips , shoulders and knees. Mild  deformity ,swelling and  crepitus noted.in knees No muscle wasting or atrophy.   Neurologic: Cranial nerves 2 to 12 intact. Power, tone ,sensation s normal throughout. No disturbance in gait. No tremor.  Skin: Intact, no ulceration, erythema , scaling or rash noted. Pigmentation normal throughout  Psych; Normal mood and affect. Judgement and concentration normal   Assessment & Plan:  Annual physical exam Annual exam as documented. Counseling done  re healthy lifestyle involving commitment to 150 minutes exercise per week, heart healthy diet, and attaining healthy weight.The importance of adequate sleep also  discussed. Regular seat belt use and home safety, is also discussed. Changes in health habits are decided on by the patient with goals and time frames  set for achieving them. Immunization and cancer screening needs are specifically addressed at this visit.   Encounter for immunization After obtaining informed consent, the shingrix  #2  vaccine is  administered , with no adverse effect noted at the time of administration.   Type 2 diabetes mellitus with obesity (HCC) Marked improvement , continue current meds Diabetes associated with hyperlipidemia, obesity, and arthritis  Ms. Herne is reminded of the importance of commitment to daily physical activity for 30 minutes or more, as able and the need to limit carbohydrate intake to 30 to 60 grams per meal to help with blood sugar control.   The need to take medication as prescribed, test blood sugar as directed, and to call between visits if there is a concern that blood sugar is uncontrolled is also discussed.   Ms. Daw is reminded of the importance of daily foot exam, annual eye examination, and good blood sugar, blood pressure and cholesterol control.     Latest Ref Rng & Units 12/11/2023    8:59 AM 08/07/2023    2:49 PM 03/28/2023   10:46 AM 12/05/2022   11:01 AM 08/16/2022   11:17 AM  Diabetic Labs  HbA1c 4.8 - 5.6 % 6.4  7.1  7.8  6.8    Micro/Creat Ratio 0 - 29 mg/g creat 3    13    Chol 100 - 199 mg/dL  177   169   168   HDL >39 mg/dL 63   59   59   Calc LDL 0 - 99 mg/dL 897   99   99   Triglycerides 0 - 149 mg/dL 63   52   49   Creatinine 0.57 - 1.00 mg/dL 9.37  9.40  9.30   9.24       12/13/2023   11:05 AM 09/06/2023    3:06 PM 04/04/2023   10:10 AM 01/31/2023    9:14 AM 12/20/2022    9:17 AM 12/19/2022   10:31 AM 11/07/2022    3:58 PM  BP/Weight  Systolic BP 127 116 111 106 110 104 144  Diastolic BP 76 77 73 72 66 67 85  Wt. (Lbs) 293.02 313.12 317.12 316 311 312.12 316  BMI 53.59 kg/m2 56.36 kg/m2 58 kg/m2 57.8 kg/m2  56.88 kg/m2 57.09 kg/m2 56.88 kg/m2      Latest Ref Rng & Units 09/02/2023   12:00 AM 04/04/2023   10:00 AM  Foot/eye exam completion dates  Eye Exam No Retinopathy No Retinopathy       Foot Form Completion   Done     This result is from an external source.

## 2023-12-13 NOTE — Assessment & Plan Note (Signed)

## 2023-12-13 NOTE — Assessment & Plan Note (Signed)
 Marked improvement , continue current meds Diabetes associated with hyperlipidemia, obesity, and arthritis  [name] is reminded of the importance of commitment to daily physical activity for 30 minutes or more, as able and the need to limit carbohydrate intake to 30 to 60 grams per meal to help with blood sugar control.   The need to take medication as prescribed, test blood sugar as directed, and to call between visits if there is a concern that blood sugar is uncontrolled is also discussed.   [name] is reminded of the importance of daily foot exam, annual eye examination, and good blood sugar, blood pressure and cholesterol control.     Latest Ref Rng & Units 12/11/2023    8:59 AM 08/07/2023    2:49 PM 03/28/2023   10:46 AM 12/05/2022   11:01 AM 08/16/2022   11:17 AM  Diabetic Labs  HbA1c 4.8 - 5.6 % 6.4  7.1  7.8  6.8    Micro/Creat Ratio 0 - 29 mg/g creat 3    13    Chol 100 - 199 mg/dL 822   830   831   HDL >60 mg/dL 63   59   59   Calc LDL 0 - 99 mg/dL 897   99   99   Triglycerides 0 - 149 mg/dL 63   52   49   Creatinine 0.57 - 1.00 mg/dL 9.37  9.40  9.30   9.24       12/13/2023   11:05 AM 09/06/2023    3:06 PM 04/04/2023   10:10 AM 01/31/2023    9:14 AM 12/20/2022    9:17 AM 12/19/2022   10:31 AM 11/07/2022    3:58 PM  BP/Weight  Systolic BP 127 116 111 106 110 104 144  Diastolic BP 76 77 73 72 66 67 85  Wt. (Lbs) 293.02 313.12 317.12 316 311 312.12 316  BMI 53.59 kg/m2 56.36 kg/m2 58 kg/m2 57.8 kg/m2 56.88 kg/m2 57.09 kg/m2 56.88 kg/m2      Latest Ref Rng & Units 09/02/2023   12:00 AM 04/04/2023   10:00 AM  Foot/eye exam completion dates  Eye Exam No Retinopathy No Retinopathy       Foot Form Completion   Done     This result is from an external source.

## 2023-12-13 NOTE — Assessment & Plan Note (Signed)
 After obtaining informed consent, the shingrix #2  vaccine is  administered , with no adverse effect noted at the time of administration.

## 2024-01-16 ENCOUNTER — Encounter: Payer: Self-pay | Admitting: Family Medicine

## 2024-01-17 ENCOUNTER — Other Ambulatory Visit: Payer: Self-pay

## 2024-01-17 ENCOUNTER — Other Ambulatory Visit (HOSPITAL_COMMUNITY): Payer: Self-pay

## 2024-01-17 DIAGNOSIS — E1169 Type 2 diabetes mellitus with other specified complication: Secondary | ICD-10-CM

## 2024-01-17 MED ORDER — FREESTYLE LIBRE 3 PLUS SENSOR MISC
2 refills | Status: AC
  Filled 2024-01-17 (×2): qty 2, 30d supply, fill #0
  Filled 2024-02-14: qty 2, 30d supply, fill #1
  Filled 2024-05-19 – 2024-05-20 (×2): qty 2, 30d supply, fill #2

## 2024-01-26 ENCOUNTER — Other Ambulatory Visit (HOSPITAL_COMMUNITY): Payer: Self-pay

## 2024-02-06 ENCOUNTER — Other Ambulatory Visit (HOSPITAL_COMMUNITY): Payer: Self-pay

## 2024-02-10 ENCOUNTER — Ambulatory Visit: Admitting: Women's Health

## 2024-02-14 ENCOUNTER — Other Ambulatory Visit (HOSPITAL_COMMUNITY): Payer: Self-pay

## 2024-02-14 ENCOUNTER — Other Ambulatory Visit: Payer: Self-pay

## 2024-02-25 ENCOUNTER — Other Ambulatory Visit (HOSPITAL_COMMUNITY): Payer: Self-pay

## 2024-03-07 ENCOUNTER — Other Ambulatory Visit: Payer: Self-pay | Admitting: Family Medicine

## 2024-03-09 ENCOUNTER — Other Ambulatory Visit (HOSPITAL_COMMUNITY): Payer: Self-pay

## 2024-03-09 ENCOUNTER — Other Ambulatory Visit: Payer: Self-pay

## 2024-03-09 MED ORDER — SYNJARDY 12.5-1000 MG PO TABS
1.0000 | ORAL_TABLET | Freq: Every day | ORAL | 1 refills | Status: DC
  Filled 2024-03-09: qty 90, 90d supply, fill #0

## 2024-03-11 ENCOUNTER — Other Ambulatory Visit (HOSPITAL_COMMUNITY): Payer: Self-pay

## 2024-03-12 ENCOUNTER — Other Ambulatory Visit: Payer: Self-pay

## 2024-03-12 ENCOUNTER — Other Ambulatory Visit (HOSPITAL_COMMUNITY): Payer: Self-pay

## 2024-04-05 ENCOUNTER — Other Ambulatory Visit (HOSPITAL_COMMUNITY): Payer: Self-pay

## 2024-04-09 ENCOUNTER — Other Ambulatory Visit (HOSPITAL_COMMUNITY): Payer: Self-pay | Admitting: Family Medicine

## 2024-04-09 DIAGNOSIS — Z1231 Encounter for screening mammogram for malignant neoplasm of breast: Secondary | ICD-10-CM

## 2024-04-15 ENCOUNTER — Other Ambulatory Visit (HOSPITAL_COMMUNITY): Payer: Self-pay

## 2024-04-15 ENCOUNTER — Telehealth: Payer: Self-pay | Admitting: Pharmacy Technician

## 2024-04-15 NOTE — Telephone Encounter (Signed)
 Pharmacy Patient Advocate Encounter   Received notification from Onbase that prior authorization for Mounjaro  is due for renewal.   Insurance verification completed.   The patient is insured through [facility].  Action: PA required; PA submitted to above mentioned insurance via Latent Key/confirmation #/EOC [id number] Status is pending

## 2024-04-17 ENCOUNTER — Ambulatory Visit (HOSPITAL_COMMUNITY)
Admission: RE | Admit: 2024-04-17 | Discharge: 2024-04-17 | Disposition: A | Discharge status: Home / Self Care | Source: Ambulatory Visit

## 2024-04-17 ENCOUNTER — Encounter (HOSPITAL_COMMUNITY): Payer: Self-pay

## 2024-04-17 DIAGNOSIS — Z1231 Encounter for screening mammogram for malignant neoplasm of breast: Secondary | ICD-10-CM | POA: Insufficient documentation

## 2024-04-17 NOTE — Telephone Encounter (Signed)
 Pharmacy Patient Advocate Encounter  Received notification from [employer] that Prior Authorization for Mounjaro  has been APPROVED from 04/15/2024 to 04/14/2025.   PA #/Case ID/Reference #: [id number]

## 2024-04-22 ENCOUNTER — Encounter: Payer: Self-pay | Admitting: Family Medicine

## 2024-04-24 ENCOUNTER — Other Ambulatory Visit: Payer: Self-pay

## 2024-04-24 DIAGNOSIS — E559 Vitamin D deficiency, unspecified: Secondary | ICD-10-CM

## 2024-04-24 DIAGNOSIS — E785 Hyperlipidemia, unspecified: Secondary | ICD-10-CM

## 2024-04-24 DIAGNOSIS — K219 Gastro-esophageal reflux disease without esophagitis: Secondary | ICD-10-CM

## 2024-04-24 DIAGNOSIS — Z131 Encounter for screening for diabetes mellitus: Secondary | ICD-10-CM

## 2024-04-28 ENCOUNTER — Other Ambulatory Visit: Payer: Self-pay

## 2024-04-28 ENCOUNTER — Ambulatory Visit: Admitting: Family Medicine

## 2024-04-28 ENCOUNTER — Other Ambulatory Visit (HOSPITAL_COMMUNITY): Payer: Self-pay

## 2024-04-28 ENCOUNTER — Encounter: Payer: Self-pay | Admitting: Family Medicine

## 2024-04-28 VITALS — BP 106/70 | HR 68 | Resp 16 | Ht 62.0 in | Wt 264.1 lb

## 2024-04-28 DIAGNOSIS — Z1329 Encounter for screening for other suspected endocrine disorder: Secondary | ICD-10-CM | POA: Diagnosis not present

## 2024-04-28 DIAGNOSIS — D126 Benign neoplasm of colon, unspecified: Secondary | ICD-10-CM | POA: Diagnosis not present

## 2024-04-28 DIAGNOSIS — E559 Vitamin D deficiency, unspecified: Secondary | ICD-10-CM | POA: Diagnosis not present

## 2024-04-28 DIAGNOSIS — E1169 Type 2 diabetes mellitus with other specified complication: Secondary | ICD-10-CM

## 2024-04-28 DIAGNOSIS — K219 Gastro-esophageal reflux disease without esophagitis: Secondary | ICD-10-CM

## 2024-04-28 DIAGNOSIS — E785 Hyperlipidemia, unspecified: Secondary | ICD-10-CM

## 2024-04-28 LAB — CMP14+EGFR
ALT: 7 IU/L (ref 0–32)
AST: 8 IU/L (ref 0–40)
Albumin: 3.9 g/dL (ref 3.9–4.9)
Alkaline Phosphatase: 80 IU/L (ref 41–116)
BUN/Creatinine Ratio: 12 (ref 9–23)
BUN: 8 mg/dL (ref 6–24)
Bilirubin Total: 0.3 mg/dL (ref 0.0–1.2)
CO2: 22 mmol/L (ref 20–29)
Calcium: 9.1 mg/dL (ref 8.7–10.2)
Chloride: 106 mmol/L (ref 96–106)
Creatinine, Ser: 0.66 mg/dL (ref 0.57–1.00)
Globulin, Total: 2.6 g/dL (ref 1.5–4.5)
Glucose: 89 mg/dL (ref 70–99)
Potassium: 4.2 mmol/L (ref 3.5–5.2)
Sodium: 142 mmol/L (ref 134–144)
Total Protein: 6.5 g/dL (ref 6.0–8.5)
eGFR: 107 mL/min/1.73

## 2024-04-28 LAB — CBC WITH DIFFERENTIAL/PLATELET
Basophils Absolute: 0.1 x10E3/uL (ref 0.0–0.2)
Basos: 1 %
EOS (ABSOLUTE): 0.2 x10E3/uL (ref 0.0–0.4)
Eos: 2 %
Hematocrit: 38.3 % (ref 34.0–46.6)
Hemoglobin: 11.8 g/dL (ref 11.1–15.9)
Immature Grans (Abs): 0 x10E3/uL (ref 0.0–0.1)
Immature Granulocytes: 0 %
Lymphocytes Absolute: 2.3 x10E3/uL (ref 0.7–3.1)
Lymphs: 37 %
MCH: 26.5 pg — ABNORMAL LOW (ref 26.6–33.0)
MCHC: 30.8 g/dL — ABNORMAL LOW (ref 31.5–35.7)
MCV: 86 fL (ref 79–97)
Monocytes Absolute: 0.3 x10E3/uL (ref 0.1–0.9)
Monocytes: 6 %
Neutrophils Absolute: 3.4 x10E3/uL (ref 1.4–7.0)
Neutrophils: 54 %
Platelets: 326 x10E3/uL (ref 150–450)
RBC: 4.45 x10E6/uL (ref 3.77–5.28)
RDW: 14.5 % (ref 11.7–15.4)
WBC: 6.2 x10E3/uL (ref 3.4–10.8)

## 2024-04-28 LAB — LIPID PANEL
Chol/HDL Ratio: 3 ratio (ref 0.0–4.4)
Cholesterol, Total: 186 mg/dL (ref 100–199)
HDL: 63 mg/dL
LDL Chol Calc (NIH): 109 mg/dL — ABNORMAL HIGH (ref 0–99)
Triglycerides: 73 mg/dL (ref 0–149)
VLDL Cholesterol Cal: 14 mg/dL (ref 5–40)

## 2024-04-28 LAB — VITAMIN D 25 HYDROXY (VIT D DEFICIENCY, FRACTURES): Vit D, 25-Hydroxy: 15.3 ng/mL — ABNORMAL LOW (ref 30.0–100.0)

## 2024-04-28 LAB — HEMOGLOBIN A1C
Est. average glucose Bld gHb Est-mCnc: 123 mg/dL
Hgb A1c MFr Bld: 5.9 % — ABNORMAL HIGH (ref 4.8–5.6)

## 2024-04-28 MED ORDER — SYNJARDY 12.5-1000 MG PO TABS
1.0000 | ORAL_TABLET | Freq: Every day | ORAL | 3 refills | Status: AC
  Filled 2024-04-28: qty 90, 90d supply, fill #0

## 2024-04-28 MED ORDER — TIRZEPATIDE 15 MG/0.5ML ~~LOC~~ SOAJ
15.0000 mg | SUBCUTANEOUS | 3 refills | Status: AC
  Filled 2024-04-28: qty 6, 84d supply, fill #0

## 2024-04-28 MED ORDER — PANTOPRAZOLE SODIUM 40 MG PO TBEC
40.0000 mg | DELAYED_RELEASE_TABLET | Freq: Every day | ORAL | 3 refills | Status: AC
  Filled 2024-04-28 – 2024-05-26 (×2): qty 90, 90d supply, fill #0

## 2024-04-28 MED ORDER — ERGOCALCIFEROL 1.25 MG (50000 UT) PO CAPS
50000.0000 [IU] | ORAL_CAPSULE | ORAL | 2 refills | Status: AC
  Filled 2024-04-28: qty 12, 84d supply, fill #0

## 2024-04-28 NOTE — Assessment & Plan Note (Signed)
 Updated lab needed at/ before next visit.

## 2024-04-28 NOTE — Assessment & Plan Note (Signed)
 Colonoscopy past due pt to follow through and get this done

## 2024-04-28 NOTE — Assessment & Plan Note (Addendum)
 Diabetes associated with hyperlipidemia, obesity, and arthritis  [name] is reminded of the importance of commitment to daily physical activity for 30 minutes or more, as able and the need to limit carbohydrate intake to 30 to 60 grams per meal to help with blood sugar control.   The need to take medication as prescribed, test blood sugar as directed, and to call between visits if there is a concern that blood sugar is uncontrolled is also discussed.   [name] is reminded of the importance of daily foot exam, annual eye examination, and good blood sugar, blood pressure and cholesterol control.     Latest Ref Rng & Units 04/27/2024   10:04 AM 12/11/2023    8:59 AM 08/07/2023    2:49 PM 03/28/2023   10:46 AM 12/05/2022   11:01 AM  Diabetic Labs  HbA1c 4.8 - 5.6 % 5.9  6.4  7.1  7.8  6.8   Micro/Creat Ratio 0 - 29 mg/g creat  3    13   Chol 100 - 199 mg/dL 813  822   830    HDL >60 mg/dL 63  63   59    Calc LDL 0 - 99 mg/dL 890  897   99    Triglycerides 0 - 149 mg/dL 73  63   52    Creatinine 0.57 - 1.00 mg/dL 9.33  9.37  9.40  9.30        04/28/2024    8:58 AM 12/13/2023   11:05 AM 09/06/2023    3:06 PM 04/04/2023   10:10 AM 01/31/2023    9:14 AM 12/20/2022    9:17 AM 12/19/2022   10:31 AM  BP/Weight  Systolic BP 106 127 116 111 106 110 104  Diastolic BP 70 76 77 73 72 66 67  Wt. (Lbs) 264.12 293.02 313.12 317.12 316 311 312.12  BMI 48.31 kg/m2 53.59 kg/m2 56.36 kg/m2 58 kg/m2 57.8 kg/m2 56.88 kg/m2 57.09 kg/m2      Latest Ref Rng & Units 04/28/2024    9:00 AM 09/02/2023   12:00 AM  Foot/eye exam completion dates  Eye Exam No Retinopathy  No Retinopathy      Foot Form Completion  Done      This result is from an external source.   Controlled, no change in medication

## 2024-04-28 NOTE — Assessment & Plan Note (Addendum)
 Updated lab needed at/ before next visit.Needs to commit to weekly supplement

## 2024-04-28 NOTE — Assessment & Plan Note (Signed)
 Improving well Congratulated on this  Patient re-educated about  the importance of commitment to a  minimum of 150 minutes of exercise per week as able.  The importance of healthy food choices with portion control discussed, as well as eating regularly and within a 12 hour window most days. The need to choose clean , green food 50 to 75% of the time is discussed, as well as to make water the primary drink and set a goal of 64 ounces water daily.       04/28/2024    8:58 AM 12/13/2023   11:05 AM 09/06/2023    3:06 PM  Weight /BMI  Weight 264 lb 1.9 oz 293 lb 0.3 oz 313 lb 1.9 oz  Height 5' 2 (1.575 m) 5' 2 (1.575 m) 5' 2.5 (1.588 m)  BMI 48.31 kg/m2 53.59 kg/m2 56.36 kg/m2

## 2024-04-28 NOTE — Assessment & Plan Note (Signed)
 Hyperlipidemia:Low fat diet discussed and encouraged.   Lipid Panel  Lab Results  Component Value Date   CHOL 186 04/27/2024   HDL 63 04/27/2024   LDLCALC 109 (H) 04/27/2024   TRIG 73 04/27/2024   CHOLHDL 3.0 04/27/2024     Needs to commit to statin as prescribed and lower fat intake Updated lab needed at/ before next visit.

## 2024-04-28 NOTE — Patient Instructions (Addendum)
"   Annual exam in June  CONGRATS  Add iron  and ferritin to recent lab   HBA1C,TSH,  fasting lipid, cmp and EFGFR 3 to 5 days before next appt   It is important that you exercise regularly at least 30 minutes 5 times a week. If you develop chest pain, have severe difficulty breathing, or feel very tired, stop exercising immediately and seek medical attention   Pls commit to weekly vit D  Please commit to crestor  as prescribed to reduce risk of heart disease  Pls follow through with colonoscopy  Covid vaccines are recommended\  Think about what you will eat, plan ahead. Choose  clean, green, fresh or frozen over canned, processed or packaged foods which are more sugary, salty and fatty. 70 to 75% of food eaten should be vegetables and fruit. Three meals at set times with snacks allowed between meals, but they must be fruit or vegetables. Aim to eat over a 12 hour period , example 7 am to 7 pm, and STOP after  your last meal of the day. Drink water,generally about 64 ounces per day, no other drink is as healthy. Fruit juice is best enjoyed in a healthy way, by EATING the fruit. Thanks for choosing [facility], we consider it a privelige to serve you.  "

## 2024-04-28 NOTE — Progress Notes (Signed)
 "  [name]     MRN: [id number]      DOB: 28-Dec-1975  Chief Complaint  Patient presents with   Diabetes    Follow up     HPI [name] is here for follow up and re-evaluation of chronic medical conditions, medication management and review of any available recent lab and radiology data.  Preventive health is updated, specifically  Cancer screening and Immunization.   Doing extremely well with lifestyle change and weight loss Denies polyuria, polydipsia, blurred vision , or hypoglycemic episodes.    ROS Denies recent fever or chills. Denies sinus pressure, nasal congestion, ear pain or sore throat. Denies chest congestion, productive cough or wheezing. Denies chest pains, palpitations and leg swelling Denies abdominal pain, nausea, vomiting,diarrhea or constipation.   Denies dysuria, frequency, hesitancy or incontinence. Denies joint pain, swelling and limitation in mobility. Denies headaches, seizures, numbness, or tingling. Denies depression, anxiety or insomnia. Denies skin break down or rash.   PE  BP 106/70   Pulse 68   Resp 16   Ht 5' 2 (1.575 m)   Wt 264 lb 1.9 oz (119.8 kg)   SpO2 98%   BMI 48.31 kg/m   Patient alert and oriented and in no cardiopulmonary distress.  HEENT: No facial asymmetry, EOMI,     Neck supple .  Chest: Clear to auscultation bilaterally.  CVS: S1, S2 no murmurs, no S3.Regular rate.  ABD: Soft non tender.   Ext: No edema  MS: Adequate ROM spine, shoulders, hips and knees.  Skin: Intact, no ulcerations or rash noted.  Psych: Good eye contact, normal affect. Memory intact not anxious or depressed appearing.  CNS: CN 2-12 intact, power,  normal throughout.no focal deficits noted.   Assessment & Plan  Hyperlipidemia LDL goal <100 Hyperlipidemia:Low fat diet discussed and encouraged.   Lipid Panel  Lab Results  Component Value Date   CHOL 186 04/27/2024   HDL 63 04/27/2024   LDLCALC 109 (H) 04/27/2024   TRIG 73  04/27/2024   CHOLHDL 3.0 04/27/2024     Needs to commit to statin as prescribed and lower fat intake Updated lab needed at/ before next visit.   Morbid obesity (HCC) Improving well Congratulated on this  Patient re-educated about  the importance of commitment to a  minimum of 150 minutes of exercise per week as able.  The importance of healthy food choices with portion control discussed, as well as eating regularly and within a 12 hour window most days. The need to choose clean , green food 50 to 75% of the time is discussed, as well as to make water the primary drink and set a goal of 64 ounces water daily.       04/28/2024    8:58 AM 12/13/2023   11:05 AM 09/06/2023    3:06 PM  Weight /BMI  Weight 264 lb 1.9 oz 293 lb 0.3 oz 313 lb 1.9 oz  Height 5' 2 (1.575 m) 5' 2 (1.575 m) 5' 2.5 (1.588 m)  BMI 48.31 kg/m2 53.59 kg/m2 56.36 kg/m2      Type 2 diabetes mellitus with other specified complication (HCC) Diabetes associated with hyperlipidemia, obesity, and arthritis  [name] is reminded of the importance of commitment to daily physical activity for 30 minutes or more, as able and the need to limit carbohydrate intake to 30 to 60 grams per meal to help with blood sugar control.   The need to take medication as prescribed, test blood sugar  as directed, and to call between visits if there is a concern that blood sugar is uncontrolled is also discussed.   [name] is reminded of the importance of daily foot exam, annual eye examination, and good blood sugar, blood pressure and cholesterol control.     Latest Ref Rng & Units 04/27/2024   10:04 AM 12/11/2023    8:59 AM 08/07/2023    2:49 PM 03/28/2023   10:46 AM 12/05/2022   11:01 AM  Diabetic Labs  HbA1c 4.8 - 5.6 % 5.9  6.4  7.1  7.8  6.8   Micro/Creat Ratio 0 - 29 mg/g creat  3    13   Chol 100 - 199 mg/dL 813  822   830    HDL >60 mg/dL 63  63   59    Calc LDL 0 - 99 mg/dL 890  897   99    Triglycerides 0 - 149  mg/dL 73  63   52    Creatinine 0.57 - 1.00 mg/dL 9.33  9.37  9.40  9.30        04/28/2024    8:58 AM 12/13/2023   11:05 AM 09/06/2023    3:06 PM 04/04/2023   10:10 AM 01/31/2023    9:14 AM 12/20/2022    9:17 AM 12/19/2022   10:31 AM  BP/Weight  Systolic BP 106 127 116 111 106 110 104  Diastolic BP 70 76 77 73 72 66 67  Wt. (Lbs) 264.12 293.02 313.12 317.12 316 311 312.12  BMI 48.31 kg/m2 53.59 kg/m2 56.36 kg/m2 58 kg/m2 57.8 kg/m2 56.88 kg/m2 57.09 kg/m2      Latest Ref Rng & Units 04/28/2024    9:00 AM 09/02/2023   12:00 AM  Foot/eye exam completion dates  Eye Exam No Retinopathy  No Retinopathy      Foot Form Completion  Done      This result is from an external source.   Controlled, no change in medication      Tubular adenoma of colon Colonoscopy past due pt to follow through and get this done  Screening for thyroid  disorder Updated lab needed at/ before next visit.   Vitamin D  deficiency Updated lab needed at/ before next visit.Needs to commit to weekly supplement   "

## 2024-04-29 ENCOUNTER — Other Ambulatory Visit: Payer: Self-pay

## 2024-05-05 ENCOUNTER — Encounter (INDEPENDENT_AMBULATORY_CARE_PROVIDER_SITE_OTHER): Payer: Self-pay | Admitting: *Deleted

## 2024-05-19 ENCOUNTER — Other Ambulatory Visit (HOSPITAL_COMMUNITY): Payer: Self-pay

## 2024-05-20 ENCOUNTER — Other Ambulatory Visit (HOSPITAL_COMMUNITY): Payer: Self-pay

## 2024-05-26 ENCOUNTER — Other Ambulatory Visit (HOSPITAL_COMMUNITY): Payer: Self-pay

## 2024-05-26 ENCOUNTER — Other Ambulatory Visit: Payer: Self-pay | Admitting: Family Medicine

## 2024-05-26 MED ORDER — ROSUVASTATIN CALCIUM 5 MG PO TABS
5.0000 mg | ORAL_TABLET | ORAL | 3 refills | Status: AC
  Filled 2024-05-26: qty 24, 84d supply, fill #0

## 2024-05-28 ENCOUNTER — Encounter: Payer: Self-pay | Admitting: Gastroenterology

## 2024-09-22 ENCOUNTER — Encounter: Payer: Self-pay | Admitting: Family Medicine
